# Patient Record
Sex: Female | Born: 1955
Health system: Southern US, Community
[De-identification: ages and names within clinical notes are randomized; demographics above are authoritative.]

## PROBLEM LIST (undated history)

## (undated) DIAGNOSIS — R112 Nausea with vomiting, unspecified: Secondary | ICD-10-CM

## (undated) DIAGNOSIS — K635 Polyp of colon: Secondary | ICD-10-CM

## (undated) DIAGNOSIS — E7800 Pure hypercholesterolemia, unspecified: Secondary | ICD-10-CM

## (undated) DIAGNOSIS — M8580 Other specified disorders of bone density and structure, unspecified site: Secondary | ICD-10-CM

## (undated) DIAGNOSIS — K219 Gastro-esophageal reflux disease without esophagitis: Secondary | ICD-10-CM

## (undated) DIAGNOSIS — R7303 Prediabetes: Secondary | ICD-10-CM

## (undated) DIAGNOSIS — F329 Major depressive disorder, single episode, unspecified: Secondary | ICD-10-CM

## (undated) DIAGNOSIS — I10 Essential (primary) hypertension: Secondary | ICD-10-CM

## (undated) DIAGNOSIS — F32A Depression, unspecified: Secondary | ICD-10-CM

## (undated) DIAGNOSIS — Z9889 Other specified postprocedural states: Secondary | ICD-10-CM

## (undated) DIAGNOSIS — F419 Anxiety disorder, unspecified: Secondary | ICD-10-CM

## (undated) HISTORY — PX: CERVICAL SPINE SURGERY: SHX589

## (undated) HISTORY — DX: Anxiety disorder, unspecified: F41.9

## (undated) HISTORY — DX: Pure hypercholesterolemia, unspecified: E78.00

## (undated) HISTORY — DX: Depression, unspecified: F32.A

## (undated) HISTORY — DX: Gastro-esophageal reflux disease without esophagitis: K21.9

## (undated) HISTORY — DX: Major depressive disorder, single episode, unspecified: F32.9

## (undated) HISTORY — PX: BREAST LUMPECTOMY: SHX2

## (undated) HISTORY — DX: Other specified disorders of bone density and structure, unspecified site: M85.80

## (undated) HISTORY — DX: Polyp of colon: K63.5

---

## 1997-12-02 ENCOUNTER — Other Ambulatory Visit: Admission: RE | Admit: 1997-12-02 | Discharge: 1997-12-02 | Payer: Self-pay | Admitting: Gynecology

## 1999-04-06 ENCOUNTER — Other Ambulatory Visit: Admission: RE | Admit: 1999-04-06 | Discharge: 1999-04-06 | Payer: Self-pay | Admitting: Family Medicine

## 1999-11-11 ENCOUNTER — Other Ambulatory Visit: Admission: RE | Admit: 1999-11-11 | Discharge: 1999-11-11 | Payer: Self-pay | Admitting: Gynecology

## 2000-01-06 ENCOUNTER — Other Ambulatory Visit: Admission: RE | Admit: 2000-01-06 | Discharge: 2000-01-06 | Payer: Self-pay | Admitting: Gynecology

## 2001-09-11 ENCOUNTER — Other Ambulatory Visit: Admission: RE | Admit: 2001-09-11 | Discharge: 2001-09-11 | Payer: Self-pay | Admitting: Gynecology

## 2002-11-11 ENCOUNTER — Other Ambulatory Visit: Admission: RE | Admit: 2002-11-11 | Discharge: 2002-11-11 | Payer: Self-pay | Admitting: Gynecology

## 2003-03-11 ENCOUNTER — Ambulatory Visit (HOSPITAL_BASED_OUTPATIENT_CLINIC_OR_DEPARTMENT_OTHER): Admission: RE | Admit: 2003-03-11 | Discharge: 2003-03-11 | Payer: Self-pay | Admitting: Gynecology

## 2003-03-11 ENCOUNTER — Ambulatory Visit (HOSPITAL_COMMUNITY): Admission: RE | Admit: 2003-03-11 | Discharge: 2003-03-11 | Payer: Self-pay | Admitting: Gynecology

## 2003-03-11 ENCOUNTER — Encounter (INDEPENDENT_AMBULATORY_CARE_PROVIDER_SITE_OTHER): Payer: Self-pay | Admitting: Specialist

## 2004-03-05 ENCOUNTER — Encounter: Admission: RE | Admit: 2004-03-05 | Discharge: 2004-03-05 | Payer: Self-pay | Admitting: Cardiovascular Disease

## 2004-03-12 ENCOUNTER — Encounter: Admission: RE | Admit: 2004-03-12 | Discharge: 2004-03-12 | Payer: Self-pay | Admitting: Cardiovascular Disease

## 2004-08-23 ENCOUNTER — Ambulatory Visit (HOSPITAL_COMMUNITY): Admission: RE | Admit: 2004-08-23 | Discharge: 2004-08-23 | Payer: Self-pay | Admitting: Cardiovascular Disease

## 2005-11-16 ENCOUNTER — Ambulatory Visit: Payer: Self-pay | Admitting: Family Medicine

## 2005-11-25 ENCOUNTER — Ambulatory Visit: Payer: Self-pay | Admitting: Family Medicine

## 2005-11-25 LAB — CONVERTED CEMR LAB
ALT: 21 units/L (ref 0–40)
AST: 17 units/L (ref 0–37)

## 2005-12-05 ENCOUNTER — Ambulatory Visit: Payer: Self-pay | Admitting: Family Medicine

## 2005-12-05 LAB — CONVERTED CEMR LAB: Total CK: 74 units/L (ref 7–177)

## 2005-12-15 ENCOUNTER — Ambulatory Visit: Payer: Self-pay | Admitting: Family Medicine

## 2005-12-20 ENCOUNTER — Encounter: Admission: RE | Admit: 2005-12-20 | Discharge: 2005-12-20 | Payer: Self-pay | Admitting: Family Medicine

## 2006-01-16 ENCOUNTER — Ambulatory Visit: Payer: Self-pay | Admitting: Family Medicine

## 2006-01-16 LAB — CONVERTED CEMR LAB
AST: 20 units/L (ref 0–37)
Cholesterol: 239 mg/dL (ref 0–200)
LDL DIRECT: 188.9 mg/dL

## 2006-02-09 ENCOUNTER — Ambulatory Visit: Payer: Self-pay | Admitting: Cardiology

## 2006-02-16 ENCOUNTER — Ambulatory Visit: Payer: Self-pay | Admitting: Family Medicine

## 2006-03-30 ENCOUNTER — Ambulatory Visit: Payer: Self-pay | Admitting: Cardiology

## 2006-03-30 LAB — CONVERTED CEMR LAB
ALT: 25 units/L (ref 0–40)
Cholesterol: 334 mg/dL (ref 0–200)
Direct LDL: 242.4 mg/dL
Total CHOL/HDL Ratio: 8
Triglycerides: 233 mg/dL (ref 0–149)

## 2006-04-06 ENCOUNTER — Ambulatory Visit: Payer: Self-pay | Admitting: *Deleted

## 2006-05-12 DIAGNOSIS — Z860100 Personal history of colon polyps, unspecified: Secondary | ICD-10-CM | POA: Insufficient documentation

## 2006-05-12 DIAGNOSIS — E785 Hyperlipidemia, unspecified: Secondary | ICD-10-CM | POA: Insufficient documentation

## 2006-05-30 ENCOUNTER — Ambulatory Visit: Payer: Self-pay | Admitting: Cardiology

## 2006-05-30 LAB — CONVERTED CEMR LAB
Alkaline Phosphatase: 53 units/L (ref 39–117)
Bilirubin, Direct: 0.1 mg/dL (ref 0.0–0.3)
Total CHOL/HDL Ratio: 5.9
Total Protein: 6.7 g/dL (ref 6.0–8.3)
Triglycerides: 145 mg/dL (ref 0–149)
VLDL: 29 mg/dL (ref 0–40)

## 2006-06-01 ENCOUNTER — Ambulatory Visit: Payer: Self-pay | Admitting: Cardiology

## 2006-06-02 ENCOUNTER — Ambulatory Visit: Payer: Self-pay | Admitting: Family Medicine

## 2006-06-08 ENCOUNTER — Ambulatory Visit: Payer: Self-pay | Admitting: Cardiovascular Disease

## 2006-06-26 ENCOUNTER — Ambulatory Visit: Payer: Self-pay

## 2006-07-06 ENCOUNTER — Ambulatory Visit: Payer: Self-pay | Admitting: Cardiovascular Disease

## 2006-07-06 ENCOUNTER — Ambulatory Visit: Payer: Self-pay | Admitting: *Deleted

## 2006-07-06 LAB — CONVERTED CEMR LAB
ALT: 23 units/L (ref 0–40)
AST: 19 units/L (ref 0–37)
Bilirubin, Direct: 0.1 mg/dL (ref 0.0–0.3)
Cholesterol: 198 mg/dL (ref 0–200)
HDL: 48 mg/dL (ref >39.0)
Total Protein: 7 g/dL (ref 6.0–8.3)

## 2006-12-01 ENCOUNTER — Ambulatory Visit: Payer: Self-pay | Admitting: Family Medicine

## 2006-12-01 DIAGNOSIS — M8949 Other hypertrophic osteoarthropathy, multiple sites: Secondary | ICD-10-CM | POA: Insufficient documentation

## 2006-12-01 DIAGNOSIS — M159 Polyosteoarthritis, unspecified: Secondary | ICD-10-CM

## 2009-08-26 HISTORY — PX: COLONOSCOPY: SHX174

## 2010-06-25 NOTE — Letter (Signed)
Jun 08, 2006    Leanne Chang, M.D.  [phone] [place], [place] [address]   RE:  ZELLA, DEWAN  MRN:  [id number]  /  DOB:  [birth year]   Dear Dr. Blossom Hoops,   It was my pleasure to see Christine Hawkins as an outpatient at the [facility]  Cardiology Office on Jun 08, 2006.  As you know, she is a delightful, 55-  year-old woman, who presents for further evaluation of chest pain.   Christine Hawkins presents with her husband today.  They describe symptoms of  chest pain that are sharp in nature and occurring in the substernal  region.  They seem to last for only a few seconds, but there is a  prolonged period of fatigue following her episodes of pain.  She also  has some dizziness and headache at times that are associated with her  chest pain.  She states that, this morning, she woke up with left neck,  shoulder and arm pain, but this seems to be exacerbated by movement of  the arm and is unrelated to her chest pain.  She does have some stable  exertional dyspnea, but this is unchanged over time.  She has had no  syncope, orthopnea, PND or palpitations.  Her pain does not radiate to  the back, shoulder or jaw.  She has noticed this pain over the last one  month.  She has had some episodes of chest pain in the past that have  prompted evaluation and she has had an adenosine Myoview stress study  performed back in July of 2006 that was normal.  Her LVEF was calculated  at 65% and there was no evidence of myocardial ischemia.   CURRENT MEDICATIONS INCLUDE:  1. Crestor 10 mg daily.  2. Zetia 10 mg daily.  3. Aspirin 81 mg daily.  4. Calcium daily.  5. An over-the-counter estrogen and soy preparation.   ALLERGIES:  NKDA.   PAST MEDICAL HISTORY:  Pertinent only for dyslipidemia and remote C-  sections.  No other history to report.   FAMILY HISTORY:  Pertinent for coronary artery disease.  Her mother had  a first myocardial infarction at age 55 and died of heart disease at age  77.  Her father had coronary bypass at age 74 and he is still living.  She has a sister who has no history of coronary artery disease and is  overall healthy.   SOCIAL HISTORY:  The patient is married.  She is originally from [place].  She has been living in the [place] for 13 years.  She does  not work outside of the house.  She does not exercise.  She does not  smoke cigarettes or drink alcohol.   REVIEW OF SYSTEMS:  A complete 12-point review of systems was performed.  The only pertinent positive is headaches.  All other systems were  reviewed and are negative, except as detailed above.   PHYSICAL EXAMINATION:  The patient is alert and oriented.  She is in no  acute distress.  She is well-appearing.  Weight is 152 pounds, blood  pressure is 98/70, heart rate is 60, respiratory rate is 12.  HEENT:  Normal.  NECK:  Normal carotid upstrokes without bruits.  Jugular venous pressure  is normal.  No thyromegaly or thyroid nodules.  LUNGS:  Clear to auscultation bilaterally.  HEART:  The apex is discrete and nondisplaced.  There is no right  ventricular heave or lift.  The heart is regular rate and rhythm with an  increased P2 sound.  There are no murmurs or gallops.  ABDOMEN:  Soft, nontender, no organomegaly, no abdominal bruits.  EXTREMITIES:  No clubbing, cyanosis or edema.  Peripheral pulses are 2+  and equal throughout.  NEUROLOGIC:  Cranial nerves II through XII are intact.  Strength is 5/5  and equal in the arms and legs bilaterally.  SKIN:  Warm and dry without rash.  LYMPHATICS:  There is no adenopathy.   Her EKG shows normal sinus rhythm and is within normal limits.   ASSESSMENT:  Christine Hawkins is a 56 year old woman with cardiovascular risk  factors of dyslipidemia and strong family history of coronary artery  disease.  She presents with atypical chest pain.  Despite the atypical  features and normal adenosine Myoview stress study in 2006, I am  concerned about her  chest pain in the setting of her cardiac risk  factors.  I would like her to undergo an exercise echocardiogram to  further evaluate the presence of any inducible ischemia.   She follows up in the lipid clinic for her antihyperlipidemic therapy  and should continue her regular followup there.  She does not have other  modifiable risk factors, such as hypertension or diabetes, and she  should continue on daily aspirin.  We will be in contact with her, as  well as your office, after the results of her stress echocardiogram are  available.   Dr. Blossom Hoops, thanks again for allowing me to see Christine Hawkins.  Please  feel free to call me at any time with questions regarding her care.    Sincerely,      Veverly Fells. Excell Seltzer, MD  Electronically Signed    MDC/MedQ  DD: 06/08/2006  DT: 06/08/2006  Job #: [id number]

## 2010-06-25 NOTE — Op Note (Signed)
Christine Hawkins, Christine Hawkins                              ACCOUNT NO.:  [id number]   MEDICAL RECORD NO.:  [id number]                   PATIENT TYPE:  AMB   LOCATION:  [facility]                                 FACILITY:  [facility]   PHYSICIAN:  Juan H. Lily Peer, M.D.             DATE OF BIRTH:  [birth year]   DATE OF PROCEDURE:  03/11/2003  DATE OF DISCHARGE:                                 OPERATIVE REPORT   PREOPERATIVE DIAGNOSIS:  Postmenopausal bleeding.   POSTOPERATIVE DIAGNOSIS:  Postmenopausal bleeding.   ANESTHESIA:  General endotracheal anesthesia.   PROCEDURES PERFORMED:  1. Diagnostic hysteroscopy.  2. Dilatation and curettage.   SURGEON:  Juan H. Lily Peer, M.D.   INDICATION FOR OPERATION:  A 55 year old gravida 3, para 2, AB 1, with  postmenopausal bleeding as a result of unopposed estrogen.   FINDINGS:  Normal intrauterine cavity.  Both tubal ostia identified.  Smooth  endocervical canal.  No intrauterine cavitary abnormalities noted.   DESCRIPTION OF OPERATION:  After the patient was adequately counseled, she  was taken to the operating room, where she underwent a successful general  endotracheal anesthesia.  She was placed in a low lithotomy position and the  vagina and perineum were prepped and draped in the usual sterile fashion.  A  laminaria that had been placed the night before in the office  intracervically was removed, thus requiring minimal dilatation of the  cervical canal.  A diagnostic hysteroscope was introduced into the  intrauterine cavity after a single-tooth tenaculum had been placed on the  anterior cervical lip for manipulation during the operation.  Sorbitol 3%  was the distending medium, and the intrauterine pressure was set at 100  mmHg.  Initially we started at 80 and increased to 100 for complete  distention of the uterine cavity for full visualization.  The patient had a  smooth endocervical canal, endometrial cavity with no abnormalities noted or  any  abnormal vasculature.  Both tubal ostia were identified.  Once this was  completed, with a serrated curette the intrauterine cavity was curetted to  obtain some tissue sample, although it was minimal, for histologic  evaluation.  The single-tooth tenaculum was removed.  Silver nitrate and  Monsel's solution were used at the site of the tenaculum prong, and the  patient was extubated, transferred to recovery with stable vital signs.  She  did receive 2 g of Cefotan for prophylaxis and received 30 mg of Toradol for  postop analgesia en route to the recovery room.                                               Juan H. Lily Peer, M.D.    JHF/MEDQ  D:  03/11/2003  T:  03/11/2003  Job:  [id number]

## 2010-06-25 NOTE — H&P (Signed)
NAMEARSENIA, GORACKE                              ACCOUNT NO.:  [id number]   MEDICAL RECORD NO.:  [id number]                   PATIENT TYPE:  AMB   LOCATION:  [facility]                                 FACILITY:  [facility]   PHYSICIAN:  Juan H. Lily Peer, M.D.             DATE OF BIRTH:  1955/02/26   DATE OF ADMISSION:  03/11/2003  DATE OF DISCHARGE:                                HISTORY & PHYSICAL   CHIEF COMPLAINT:  Postmenopausal bleeding.   HISTORY OF PRESENT ILLNESS:  The patient is a 55 year old, gravida 3, para  2, AB 1, who was seen in the office in October of 2004.  She had stated that  she had been taking an over the counter estrogen from the health food  department that was called Estrovent.  She had been concerned that she was  taking unopposed estrogen and she had continued to do so.  She had reported  some vaginal bleeding back then.  We intended to do an endometrial biopsy,  but her cervix was stenotic, requiring dilators, but was minimally able to  fit it through without causing severe discomfort for the patient.  It was  felt that it was better to proceed with endometrial biopsy and diagnostic  hysteroscopy at an outpatient surgical center under anesthesia.  We did  manage to get an ultrasound on January 13, 2003, which demonstrated an  endometrial stripe of 0.9 mm.  The right and left ovaries were normal.  The  cul-de-sac was negative.  She has suffered from vasomotor symptoms.  In the  past she had been on Prempro and discontinued it.  We are contemplating on  starting her on low-dose cyclical estrogen replacement therapy to help with  her symptoms once we know for sure that we have thoroughly evaluated her  postmenopausal bleeding/unopposed estrogen.   PAST MEDICAL HISTORY:  She is menopausal.  She has had two cesarean  sections.  She has had one D&C.  She has hypercholesterolemia for which she  is currently on Lipitor.  She is taking calcium supplementation.   FAMILY  HISTORY:  Significant for the fact that both parents had a history of  cardiovascular disease.   MEDICATIONS:  No other medications.   PHYSICAL EXAMINATION:  WEIGHT:  The patient currently weighs 146-1/2 pounds.  HEIGHT:  5 feet 3-1/2 inches tall.  VITAL SIGNS:  Blood pressure 120/68.  HEENT:  Unremarkable.  NECK:  Supple.  Trachea midline.  No carotid bruits.  No thyromegaly.  LUNGS:  Clear to auscultation without rhonchi or wheezes.  HEART:  Regular rate and rhythm.  No murmurs or gallops.  BREASTS:  Unremarkable at the time of her annual exam.  ABDOMEN:  Soft and nontender without rebound or guarding.  PELVIC:  Bartholin's, urethral, and Skene's glands within normal limits.  Vagina with atrophic changes.  Uterus slightly anteverted and normal in  size, shape, and consistency.  Adnexa without masses or tenderness.  RECTAL:  Hemoccult negative.   ASSESSMENT:  A 55 year old, gravida 3, para 2, AB 1, with unopposed  estrogen, postmenopausal bleeding, and stenotic cervical os.  Was seen in  the office today on March 10, 2003.  Xylocaine 1% had been infiltrated  into the cervical stroma at the 2, 4, 8, and 10 o'clock positions for  approximately 8 mL and with small dilators the cervix was able to be dilated  in an effort to introduce a laminaria to be placed intracervically to  facilitate tomorrow's hysteroscopic procedure.  Also today the patient was  counseled as to the risks, benefits, pros, and cons of hysteroscopic surgery  and literature information had previously been provided.  The plan is to  proceed with a diagnostic hysteroscopy and an endometrial biopsy in an  outpatient setting.  She was also given a prescription for Lortab 7.5/500 mg  to take one p.o. q.4-6h. p.r.n. in the event that she may need it tonight.  All of the above we discussed with the patient in Spanish and will follow  accordingly.   PLAN:  The patient is scheduled for diagnostic hysteroscopy and  endometrial  biopsy on March 11, 2003, at 1 p.m. at the [facility].                                               Juan H. Lily Peer, M.D.    JHF/MEDQ  D:  03/10/2003  T:  03/10/2003  Job:  [id number]

## 2010-06-25 NOTE — Assessment & Plan Note (Signed)
[facility]                               LIPID CLINIC NOTE   Christine Hawkins, Christine Hawkins                           MRN:          [id number]  DATE:02/09/2006                            DOB:          Sep 06, 1955    Christine Hawkins comes in today for the first visit with [place] here in the Lipid  Clinic, today accompanied by her husband and grandchild.  Christine Hawkins is  primarily a Spanish speaker, so most of the communication was done via  translation with her husband.  She was referred to [place] by Dr. Blossom Hoops  at the [facility].   PAST MEDICAL HISTORY:  Per the patient and husband.  Osteoarthritis with  spinal calcifications and some bone spurs.  High cholesterol.   CURRENT MEDICATIONS:  Include:  1. Lovastatin 40 mg daily which she started 2 months ago.  2. Estrogen which is an over-the-counter product of some sort.  3. Calcium supplementations.  4. PRN Tylenol.   She has tried other cholesterol medications in the past, Pravachol for  many years, Lipitor for a short period of time and Vytorin for an  undetermined amount of time.  She is currently experiencing shoulder,  back and arm pains, which she attributes to lovastatin.  She has had  similar aches and pains with other statins she has been on in the past.  However, she has been compliant with her lovastatin.   FAMILY HISTORY:  Includes father who had an MI and a mother who had an  MI and a stroke.   PHYSICAL EXAMINATION:  Her weight is 154 pounds.  Blood pressure is  100/60, heart rate is 100.   LABORATORY DATA:  Includes total cholesterol 239, triglycerides 74, HDL  48.4, LDL 188.9, liver function tests are within normal limits.   ASSESSMENT:  Christine Hawkins seems to be experiencing some myalgias on  lovastatin.  Her cholesterol is at goal except for an elevated LDL.  Her  goal LDL for primary prevention is less than 100.   PLAN:  We are going to draw some blood today to check a creatine kinase  to further determine if the myalgias are due to the statin that she is  taking, but in the meantime we are going to switch to Crestor, which is  more water soluble and carries less incidence of myalgias.  We are going  to start with 2.5 mg daily for 1 week and then 5 mg daily thereafter.  Samples are given.  Her and her husband voiced understanding of the  plan.  We did not talk much about diet and exercise at this visit, but  will make an effort to do this more next time.  Follow up is in 6 weeks,  at which time we will get a lipid and liver panel.  Her and her husband  were instructed to call with any questions or concerns.   follow-up:  ck wnl.Marland Kitchenkeep plan as above...mp      Charolotte Eke, PharmD  Electronically Signed      Salvadore Farber, MD  Electronically Signed   TP/MedQ  DD: 02/09/2006  DT: 02/09/2006  Job #: [id number]   cc:   Leanne Chang, M.D.

## 2010-06-25 NOTE — Assessment & Plan Note (Signed)
[facility]                               LIPID CLINIC NOTE   Christine Hawkins, Christine Hawkins                           MRN:          [id number]  DATE:06/01/2006                            DOB:          Mar 02, 1955    Christine Hawkins comes in today for followup of her hyperlipidemia therapy which  includes Crestor 5 mg daily.  She has been compliant with this and  tolerating it just fine   PHYSICAL EXAMINATION:  Includes a weight of 150 pounds.  Her blood  pressure is 115/70, heart rate is 60.   LABORATORY DATA:  Includes total cholesterol 259, triglycerides 145, HDL  43.6, LDL 183.2.  Liver function tests are within normal limits.   She had been continuing to change to a heart healthy diet.  She drinks  soy milk on a regular basis.  She also uses dietary supplement drink  called Melina Copa, which she reports has algae in it.  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  Of note,  she complains over the last 2 weeks of having some episodic dizziness  and some chest pain.  She has increased her work load over these 2 weeks  and the chest pain and dizziness does occur when she is worn down at the  end of a long day.  She takes her blood pressure at home and claims it  has been more variable lately, being higher and lower than normal.   ASSESSMENT:  Christine Hawkins seems to be tolerating Crestor.  Her triglycerides  are up significantly from 74 to 145, although they remain at the goal of  less than 150.  HDL remains at goal of greater than 40.  Her LDL remains  quite high and not at the goal of less than 100.  We are not seeing the  response from starting Crestor as we would anticipate.  Her and her  husband claim that she has been compliant with it.  The episodic  dizziness and chest pain is a little bit worrisome.  I asked her to  followup with her primary doctor which is  Dr. Blossom Hoops and I will send  a copy of  this dictation to his office.   PLAN:  We are going to increase the Crestor from 5 mg a day to 10 mg a  day.  We are going to add Zetia 10 mg a day also.  I asked her to start  taking a aspirin 81 mg daily and also to carry those with her and take 2  right away if she experiences chest pain like she has been describing.  I also asked her to followup with Dr. Blossom Hoops. I encouraged her to  continue with her heart healthy diet and if she does not experience this  dizziness and chest pain to try to make sure she  is getting regular exercise with walking.  I am going to followup with  her in 4 weeks with  lipid and liver panels to assess the medication  changes we made.      Charolotte Eke, PharmD  Electronically Signed      Rollene Rotunda, MD, [facility]  Electronically Signed   TP/MedQ  DD: 06/01/2006  DT: 06/01/2006  Job #: [id number]   cc:   Leanne Chang, M.D.

## 2010-06-25 NOTE — Assessment & Plan Note (Signed)
[facility]                        [place] OFFICE NOTE   Christine Hawkins, Christine Hawkins                           MRN:          [id number]  DATE:06/02/2006                            DOB:          1955-11-13    REASON FOR VISIT:  Chest pain.  Ms. Spilman is a 55 year old female who is  being seen at the lipid clinic, and complained of intermittent chest  pain off and on for the last 2 weeks.  We were advised of that and we  recommended the patient be evaluated in our office.  On further  questioning, the patient reports that the pain is very brief and lasts  less than 5 seconds.  Nonetheless, it is associated with dizziness and  fatigue.  The patient reports it is not associated with physical  activity.  She has a hard time describing the discomfort, sharp versus  pressure like.  The patient does reveal that she is under a lot of  stress and does have a history of anxiety and depression.  She was  previously referred to a therapist, but did not followup.  She stated  that she didn't want to take any medications for depression or  anxiety.   The patient also revealed that she has had a stress test done 5 years  ago somewhere in [place], and it was unremarkable.  Based on our  discussion, it appeared that it was for similar symptoms.   PAST MEDICAL HISTORY:  Hyperlipidemia.   MEDICATION:  1. Crestor 5 mg.  2. Calcium supplement.  3. Tylenol p.r.n.   ALLERGIES:  No known drug allergies.   REVIEW OF SYSTEMS:  As per HPI, otherwise, patient denied any  palpitations, dyspnea on exertion, cough, wheezing.  She also denied any  GI symptoms.   OBJECTIVE:  Weight 152, temperature 98.1, pulse 64, blood pressure  104/70.  IN GENERAL:  We have a pleasant female with mildly depressed mood.  HEENT:  Unremarkable.  NECK:  Supple, no lymphadenopathy, quiet bruits or JVD.  LUNGS:  Clear.  HEART:  Regular rate and rhythm, normal S1, S2, no murmurs, gallops,  or  rubs.  EXTREMITIES:  No cyanosis, clubbing, or edema.   EKG sinus brady, no ST elevations or depressions.  No PVCs or PACs.   Mood appeared down, speech had regular rate and rhythm.  Affect slightly  flat.   IMPRESSION:  A 55 year old female presenting with 2-week history of  intermittent atypical chest pain associated with brief episode of  dizziness and fatigue.  It is very likely that her symptoms are due to  anxiety, but given history of hyperlipidemia and her age, a cardiac  evaluation is warranted.   PLAN:  1. Supportive counseling was provided.  I did advise patient that I am      extremely concerned about her mental state regarding her depression      and anxiety.  She is not suicidal, but at this point declines      medication therapy.  She agreed to call [facility] for      psychotherapy as previously  referred.  2. Will refer patient to cardiology to rule out a cardiac etiology.  3. The patient to follow up with me in 1 month or sooner if need be.      Precautions were reviewed.     Leanne Chang, M.D.  Electronically Signed    LA/MedQ  DD: 06/02/2006  DT: 06/02/2006  Job #: [id number]

## 2011-04-28 DIAGNOSIS — D500 Iron deficiency anemia secondary to blood loss (chronic): Secondary | ICD-10-CM | POA: Insufficient documentation

## 2011-04-28 DIAGNOSIS — IMO0002 Reserved for concepts with insufficient information to code with codable children: Secondary | ICD-10-CM | POA: Insufficient documentation

## 2011-04-29 DIAGNOSIS — E876 Hypokalemia: Secondary | ICD-10-CM | POA: Insufficient documentation

## 2011-05-22 DIAGNOSIS — Z981 Arthrodesis status: Secondary | ICD-10-CM | POA: Insufficient documentation

## 2012-06-07 ENCOUNTER — Ambulatory Visit (HOSPITAL_COMMUNITY)
Admission: RE | Admit: 2012-06-07 | Discharge: 2012-06-07 | Disposition: A | Discharge status: Home / Self Care | Payer: BC Managed Care – PPO | Source: Ambulatory Visit | Attending: Cardiology | Admitting: Cardiology

## 2012-06-07 ENCOUNTER — Encounter (HOSPITAL_COMMUNITY)
Admission: RE | Disposition: A | Discharge status: Home / Self Care | Payer: Self-pay | Source: Ambulatory Visit | Attending: Cardiology

## 2012-06-07 DIAGNOSIS — I10 Essential (primary) hypertension: Secondary | ICD-10-CM | POA: Insufficient documentation

## 2012-06-07 DIAGNOSIS — R079 Chest pain, unspecified: Secondary | ICD-10-CM | POA: Insufficient documentation

## 2012-06-07 DIAGNOSIS — Z79899 Other long term (current) drug therapy: Secondary | ICD-10-CM | POA: Insufficient documentation

## 2012-06-07 DIAGNOSIS — F411 Generalized anxiety disorder: Secondary | ICD-10-CM | POA: Insufficient documentation

## 2012-06-07 DIAGNOSIS — E785 Hyperlipidemia, unspecified: Secondary | ICD-10-CM | POA: Insufficient documentation

## 2012-06-07 DIAGNOSIS — Z8249 Family history of ischemic heart disease and other diseases of the circulatory system: Secondary | ICD-10-CM | POA: Insufficient documentation

## 2012-06-07 DIAGNOSIS — Z7982 Long term (current) use of aspirin: Secondary | ICD-10-CM | POA: Insufficient documentation

## 2012-06-07 HISTORY — PX: LEFT HEART CATHETERIZATION WITH CORONARY ANGIOGRAM: SHX5451

## 2012-06-07 LAB — CBC
MCH: 26.5 pg (ref 26.0–34.0)
MCV: 82.8 fL (ref 78.0–100.0)
Platelets: 187 10*3/uL (ref 150–400)
RBC: 4.31 MIL/uL (ref 3.87–5.11)
RDW: 14.1 % (ref 11.5–15.5)
WBC: 7.1 10*3/uL (ref 4.0–10.5)

## 2012-06-07 LAB — LIPID PANEL
HDL: 52 mg/dL (ref >39)
LDL Cholesterol: 116 mg/dL — ABNORMAL HIGH (ref 0–99)
Triglycerides: 114 mg/dL (ref <150)
VLDL: 23 mg/dL (ref 0–40)

## 2012-06-07 LAB — COMPREHENSIVE METABOLIC PANEL
AST: 18 U/L (ref 0–37)
Albumin: 3.7 g/dL (ref 3.5–5.2)
Calcium: 9.4 mg/dL (ref 8.4–10.5)
Creatinine, Ser: 0.79 mg/dL (ref 0.50–1.10)
Total Protein: 6.7 g/dL (ref 6.0–8.3)

## 2012-06-07 LAB — PROTIME-INR: Prothrombin Time: 13.3 seconds (ref 11.6–15.2)

## 2012-06-07 SURGERY — LEFT HEART CATHETERIZATION WITH CORONARY ANGIOGRAM
Anesthesia: LOCAL

## 2012-06-07 MED ORDER — SODIUM CHLORIDE 0.9 % IV SOLN: 250.0000 mL | INTRAVENOUS | Status: DC | PRN

## 2012-06-07 MED ORDER — HEPARIN (PORCINE) IN NACL 2-0.9 UNIT/ML-% IJ SOLN
INTRAMUSCULAR | Status: AC
  Filled 2012-06-07: qty 1500

## 2012-06-07 MED ORDER — SODIUM CHLORIDE 0.9 % IV SOLN: INTRAVENOUS | Status: DC

## 2012-06-07 MED ORDER — VERAPAMIL HCL 2.5 MG/ML IV SOLN
INTRAVENOUS | Status: AC
  Filled 2012-06-07: qty 2

## 2012-06-07 MED ORDER — SODIUM CHLORIDE 0.9 % IJ SOLN: 3.0000 mL | INTRAMUSCULAR | Status: DC | PRN

## 2012-06-07 MED ORDER — NITROGLYCERIN 1 MG/10 ML FOR IR/CATH LAB
INTRA_ARTERIAL | Status: AC
  Filled 2012-06-07: qty 10

## 2012-06-07 MED ORDER — ACETAMINOPHEN 325 MG PO TABS: 650.0000 mg | ORAL_TABLET | ORAL | Status: DC | PRN

## 2012-06-07 MED ORDER — SODIUM CHLORIDE 0.9 % IV SOLN
INTRAVENOUS | Status: DC
  Administered 2012-06-07: 1000 mL via INTRAVENOUS

## 2012-06-07 MED ORDER — ASPIRIN 81 MG PO CHEW
324.0000 mg | CHEWABLE_TABLET | ORAL | Status: AC
  Administered 2012-06-07: 324 mg via ORAL

## 2012-06-07 MED ORDER — HYDROMORPHONE HCL PF 2 MG/ML IJ SOLN
INTRAMUSCULAR | Status: AC
  Filled 2012-06-07: qty 1

## 2012-06-07 MED ORDER — ONDANSETRON HCL 4 MG/2ML IJ SOLN: 4.0000 mg | Freq: Four times a day (QID) | INTRAMUSCULAR | Status: DC | PRN

## 2012-06-07 MED ORDER — SODIUM CHLORIDE 0.9 % IJ SOLN: 3.0000 mL | Freq: Two times a day (BID) | INTRAMUSCULAR | Status: DC

## 2012-06-07 MED ORDER — LIDOCAINE HCL (PF) 1 % IJ SOLN
INTRAMUSCULAR | Status: AC
  Filled 2012-06-07: qty 30

## 2012-06-07 MED ORDER — ASPIRIN 81 MG PO CHEW
CHEWABLE_TABLET | ORAL | Status: AC
  Filled 2012-06-07: qty 4

## 2012-06-07 MED ORDER — MIDAZOLAM HCL 2 MG/2ML IJ SOLN
INTRAMUSCULAR | Status: AC
  Filled 2012-06-07: qty 2

## 2012-06-07 NOTE — Interval H&P Note (Signed)
History and Physical Interval Note:  06/07/2012 7:38 AM  Christine Hawkins  has presented today for surgery, with the diagnosis of c/p  The various methods of treatment have been discussed with the patient and family. After consideration of risks, benefits and other options for treatment, the patient has consented to  Procedure(s): LEFT HEART CATHETERIZATION WITH CORONARY ANGIOGRAM (N/A) and possible angioplasty as a surgical intervention .  The patient's history has been reviewed, patient examined, no change in status, stable for surgery.  I have reviewed the patient's chart and labs.  Questions were answered to the patient's satisfaction.     Pamella Pert

## 2012-06-07 NOTE — H&P (Signed)
  Please see office visit notes for complete details of HPI.  

## 2012-06-07 NOTE — Progress Notes (Signed)
Entire initial assessment done with son Luellen Pucker present and interpreting for patient.

## 2012-06-07 NOTE — CV Procedure (Addendum)
Procedure performed:  Left heart catheterization including hemodynamic monitoring of the left ventricle, LV gram, selective right and left coronary arteriography.  Indication patient is a 57 year-old female with history of hypertension,  hyperlipidemia, family history of premature CAD presenting with recurrent chest pain.  With the suspicion for [place], and CAD, patient is brought to the cardiac catheterization lab to evaluate the  coronary anatomy for definitive diagnosis of CAD.  Hemodynamic data:  Left ventricular pressure was 106/0 with LVEDP of 13 mm mercury. Aortic pressure was 105/51 with a mean of 75 mm mercury. There was no pressure gradient across the aortic valve  Left ventricle: Performed in the RAO projection revealed LVEF of 60%. There was No MR. No wall motion abnormality.  Right coronary artery: The vessel is smooth, normal, Co-Dominant. The ostium of the right coronary artery showed catheter-induced spasm. Immediately after engaging the right coronary artery, there is mild damping, however there was contrast flow evident around the catheter. There was significant torque build-up and difficulty in engaging the right coronary artery.  Left main coronary artery is large and normal.  Circumflex coronary artery: A large vessel giving origin to a large obtuse marginal 1. It is smooth and normal. It is codominant.  LAD:  LAD gives origin to a large diagonal-1. Is normal.  Recommendation: Normal coronary arteries, codominant circulation, catheter induced spasm of the ostium of the right coronary artery. I did not attempt to see engage the RCA due to the fact she had significant tension buildup across the catheter and port was difficult. Hence did not want to injure the ostium of the RCA. If she continues to have recurrent chest pain, would consider repeat coronary angiography with probable IVUS. Patient is under extreme distress due to  social issues at home, will continue to monitor her  closely.  Technique: Under sterile precautions using a 6 French right radial  arterial access, a 5 French sheath was introduced into the right radial artery. A 5 [place] Tig 4 catheter was advanced into the ascending aorta selective left coronary artery was cannulated and angiography was performed in multiple views. The same catheter was used to perform left ventriculogram in the RAO projection. Then a 5 [place] Judkins right 4 diagnostic catheter was utilized to engage the right coronary artery with moderate amount of difficulty. The catheter was pulled back Out of the body over exchange length J-wire. NO immediate complications noted. Patient tolerated the procedure well. Hemostasis was obtained by applying TR band.

## 2014-01-16 ENCOUNTER — Encounter (HOSPITAL_COMMUNITY): Payer: Self-pay | Admitting: Cardiology

## 2014-03-19 ENCOUNTER — Telehealth: Payer: Self-pay

## 2015-01-30 DIAGNOSIS — R600 Localized edema: Secondary | ICD-10-CM | POA: Insufficient documentation

## 2015-01-30 DIAGNOSIS — G5601 Carpal tunnel syndrome, right upper limb: Secondary | ICD-10-CM | POA: Insufficient documentation

## 2015-01-30 DIAGNOSIS — R7309 Other abnormal glucose: Secondary | ICD-10-CM | POA: Insufficient documentation

## 2015-01-30 DIAGNOSIS — R7401 Elevation of levels of liver transaminase levels: Secondary | ICD-10-CM | POA: Insufficient documentation

## 2015-04-02 DIAGNOSIS — E785 Hyperlipidemia, unspecified: Secondary | ICD-10-CM | POA: Insufficient documentation

## 2015-04-02 DIAGNOSIS — R7303 Prediabetes: Secondary | ICD-10-CM | POA: Insufficient documentation

## 2015-04-02 DIAGNOSIS — K219 Gastro-esophageal reflux disease without esophagitis: Secondary | ICD-10-CM | POA: Insufficient documentation

## 2015-04-02 DIAGNOSIS — F3342 Major depressive disorder, recurrent, in full remission: Secondary | ICD-10-CM | POA: Insufficient documentation

## 2015-04-02 DIAGNOSIS — L2084 Intrinsic (allergic) eczema: Secondary | ICD-10-CM | POA: Insufficient documentation

## 2015-09-14 DIAGNOSIS — G8929 Other chronic pain: Secondary | ICD-10-CM | POA: Insufficient documentation

## 2015-09-14 DIAGNOSIS — M545 Low back pain, unspecified: Secondary | ICD-10-CM | POA: Insufficient documentation

## 2015-12-17 DIAGNOSIS — Z23 Encounter for immunization: Secondary | ICD-10-CM | POA: Insufficient documentation

## 2016-03-15 DIAGNOSIS — J101 Influenza due to other identified influenza virus with other respiratory manifestations: Secondary | ICD-10-CM | POA: Insufficient documentation

## 2016-04-19 DIAGNOSIS — F99 Mental disorder, not otherwise specified: Secondary | ICD-10-CM | POA: Insufficient documentation

## 2016-04-19 DIAGNOSIS — F5105 Insomnia due to other mental disorder: Secondary | ICD-10-CM | POA: Insufficient documentation

## 2017-05-11 DIAGNOSIS — R0602 Shortness of breath: Secondary | ICD-10-CM | POA: Insufficient documentation

## 2017-05-11 DIAGNOSIS — I6523 Occlusion and stenosis of bilateral carotid arteries: Secondary | ICD-10-CM | POA: Insufficient documentation

## 2017-05-11 DIAGNOSIS — K5900 Constipation, unspecified: Secondary | ICD-10-CM | POA: Insufficient documentation

## 2017-11-07 ENCOUNTER — Ambulatory Visit (INDEPENDENT_AMBULATORY_CARE_PROVIDER_SITE_OTHER): Payer: Medicare Other | Admitting: Emergency Medicine

## 2017-11-07 ENCOUNTER — Ambulatory Visit (INDEPENDENT_AMBULATORY_CARE_PROVIDER_SITE_OTHER): Payer: Medicare Other

## 2017-11-07 ENCOUNTER — Encounter: Payer: Self-pay | Admitting: Emergency Medicine

## 2017-11-07 VITALS — BP 144/75 | HR 64 | Temp 98.1°F | Resp 17 | Ht 63.0 in | Wt 171.0 lb

## 2017-11-07 DIAGNOSIS — G8929 Other chronic pain: Secondary | ICD-10-CM

## 2017-11-07 DIAGNOSIS — M542 Cervicalgia: Secondary | ICD-10-CM

## 2017-11-07 DIAGNOSIS — M159 Polyosteoarthritis, unspecified: Secondary | ICD-10-CM

## 2017-11-07 DIAGNOSIS — E785 Hyperlipidemia, unspecified: Secondary | ICD-10-CM

## 2017-11-07 DIAGNOSIS — Z8639 Personal history of other endocrine, nutritional and metabolic disease: Secondary | ICD-10-CM

## 2017-11-07 DIAGNOSIS — Z1159 Encounter for screening for other viral diseases: Secondary | ICD-10-CM

## 2017-11-07 DIAGNOSIS — Z1239 Encounter for other screening for malignant neoplasm of breast: Secondary | ICD-10-CM

## 2017-11-07 DIAGNOSIS — R399 Unspecified symptoms and signs involving the genitourinary system: Secondary | ICD-10-CM

## 2017-11-07 NOTE — Progress Notes (Addendum)
Saron Grubb 62 y.o.   Chief Complaint  Patient presents with  . Establish Care    check cholestrol, hand pain     HISTORY OF PRESENT ILLNESS: This is a 62 y.o. female here to establish care.  First visit with me. Has a history of osteoarthritis of her hands with chronic intermittent pain. Also has a history of cervical vertebrae fracture secondary to an accident several years ago.  Status post surgery.  Chronic neck pain. Has a history of high cholesterol.  Used to be on Crestor and Zetia but not taking it at present time.  Has not been tested in a while. No other significant past medical history. Review of systems reveal chronic lower urinary symptoms.  Has a history of 2 C-sections in the past. No recent mammogram.  HPI   Prior to Admission medications   Medication Sig Start Date End Date Taking? Authorizing Provider  aspirin EC 81 MG tablet Take 81 mg by mouth daily.   Yes [provider]  Boswellia-Glucosamine-Vit D (GLUCOSAMINE COMPLEX PO) Take 1 tablet by mouth daily.   Yes [provider]  Calcium Carbonate-Vitamin D (CALCIUM + D PO) Take 1 tablet by mouth 2 (two) times daily.   Yes [provider]  ezetimibe (ZETIA) 10 MG tablet Take 10 mg by mouth daily.   Yes [provider]  mirtazapine (REMERON) 15 MG tablet Take 15 mg by mouth at bedtime.   Yes [provider]  PARoxetine (PAXIL) 20 MG tablet Take 40 mg by mouth daily.    Yes [provider]  rosuvastatin (CRESTOR) 10 MG tablet Take 40 mg by mouth daily.   Yes [provider]  ALPRAZolam (XANAX) 0.25 MG tablet Take 0.25 mg by mouth 3 (three) times daily as needed for sleep or anxiety.    [provider]  Flaxseed, Linseed, (FLAX SEED OIL PO) Take 1 capsule by mouth 3 (three) times daily.    [provider]  metoprolol tartrate (LOPRESSOR) 25 MG tablet Take 25 mg by mouth 2 (two) times daily.    [provider]  nitroGLYCERIN  (NITROSTAT) 0.4 MG SL tablet Place 0.4 mg under the tongue every 5 (five) minutes as needed for chest pain.    [provider]    Allergies  Allergen Reactions  . Lactose Intolerance (Gi)   . Niaspan [Niacin] Rash    Itching    Patient Active Problem List   Diagnosis Date Noted  . OSTEOARTHRITIS 12/01/2006  . HYPERLIPIDEMIA 05/12/2006  . COLONIC POLYPS, BENIGN, HX OF 05/12/2006    No past medical history on file.    Social History   Socioeconomic History  . Marital status: Married    Spouse name: Not on file  . Number of children: Not on file  . Years of education: Not on file  . Highest education level: Not on file  Occupational History  . Not on file  Social Needs  . Financial resource strain: Not on file  . Food insecurity:    Worry: Not on file    Inability: Not on file  . Transportation needs:    Medical: Not on file    Non-medical: Not on file  Tobacco Use  . Smoking status: Never Smoker  . Smokeless tobacco: Never Used  Substance and Sexual Activity  . Alcohol use: Not on file  . Drug use: Not on file  . Sexual activity: Not on file  Lifestyle  . Physical activity:    Days  per week: Not on file    Minutes per session: Not on file  . Stress: Not on file  Relationships  . Social connections:    Talks on phone: Not on file    Gets together: Not on file    Attends religious service: Not on file    Active member of club or organization: Not on file    Attends meetings of clubs or organizations: Not on file    Relationship status: Not on file  . Intimate partner violence:    Fear of current or ex partner: Not on file    Emotionally abused: Not on file    Physically abused: Not on file    Forced sexual activity: Not on file  Other Topics Concern  . Not on file  Social History Narrative  . Not on file    No family history on file.   Review of Systems  Constitutional: Negative.  Negative for chills and fever.  HENT: Negative for sore  throat.   Eyes: Negative.  Negative for blurred vision and double vision.  Respiratory: Negative.  Negative for cough and shortness of breath.   Cardiovascular: Negative.  Negative for chest pain and palpitations.  Gastrointestinal: Negative.  Negative for abdominal pain, blood in stool, nausea and vomiting.  Genitourinary: Positive for frequency and urgency.  Musculoskeletal: Positive for joint pain and neck pain.  Skin: Negative.  Negative for rash.  Neurological: Negative.  Negative for dizziness and headaches.  Endo/Heme/Allergies: Negative.     Vitals:   11/07/17 1413  BP: (!) 144/75  Pulse: 64  Resp: 17  Temp: 98.1 F (36.7 C)  SpO2: 98%    Physical Exam  Constitutional: She is oriented to person, place, and time. She appears well-developed and well-nourished.  HENT:  Head: Normocephalic and atraumatic.  Right Ear: External ear normal.  Left Ear: External ear normal.  Nose: Nose normal.  Mouth/Throat: Oropharynx is clear and moist.  Eyes: Pupils are equal, round, and reactive to light. Conjunctivae and EOM are normal.  Neck: Muscular tenderness present. No spinous process tenderness present. Decreased range of motion present. No erythema present.  Cardiovascular: Normal rate, regular rhythm and normal heart sounds.  Pulmonary/Chest: Effort normal and breath sounds normal.  Abdominal: Soft. Bowel sounds are normal. She exhibits no distension. There is no tenderness.  Musculoskeletal: She exhibits no edema or tenderness.  Positive osteoarthritic changes to her fingers both hands.  Lymphadenopathy:    She has no cervical adenopathy.  Neurological: She is alert and oriented to person, place, and time. No sensory deficit. She exhibits normal muscle tone.  Skin: Skin is warm and dry. Capillary refill takes less than 2 seconds.  Psychiatric: She has a normal mood and affect. Her behavior is normal.  Vitals reviewed.  Dg Cervical Spine 2 Or 3 Views  Result Date:  11/07/2017 CLINICAL DATA:  Neck pain. Prior cervical spine fusion. Scratched it prior cervical spine surgery. EXAM: CERVICAL SPINE - 2-3 VIEW COMPARISON:  No prior FINDINGS: C4 through C7 anterior and interbody fusion. Hardware intact. No acute bony abnormality identified. No evidence of fracture. Biapical pleural thickening noted consistent scarring. IMPRESSION: C4 through C7 anterior interbody fusion. Hardware intact. No acute bony abnormality identified. Electronically Signed   By: Marcello Moores  Register   On: 11/07/2017 15:08     ASSESSMENT & PLAN: Valla Leaver was seen today for establish care.  Diagnoses and all orders for this visit:  Osteoarthritis involving multiple joints on both sides of body  Chronic neck pain -     DG Cervical Spine 2 or 3 views; Future -     Ambulatory referral to Orthopedic Surgery  History of high cholesterol -     CBC with Differential/Platelet; Future -     Comprehensive metabolic panel; Future -     Hemoglobin A1c; Future -     Lipid panel; Future  Lower urinary tract symptoms (LUTS) -     Ambulatory referral to Gynecology -     CBC with Differential/Platelet; Future  Need for hepatitis C screening test -     Hepatitis C antibody; Future  Breast cancer screening -     MM Digital Screening; Future    Patient Instructions       If you have lab work done today you will be contacted with your lab results within the next 2 weeks.  If you have not heard from [place] then please contact us. The fastest way to get your results is to register for My Chart.   IF you received an x-ray today, you will receive an invoice from [facility]. Please contact [facility] at [phone] with questions or concerns regarding your invoice.   IF you received labwork today, you will receive an invoice from [place]. Please contact [facility] at [phone] with questions or concerns regarding your invoice.   Our billing staff will not be able to assist you  with questions regarding bills from these companies.  You will be contacted with the lab results as soon as they are available. The fastest way to get your results is to activate your My Chart account. Instructions are located on the last page of this paperwork. If you have not heard from [place] regarding the results in 2 weeks, please contact this office.      Artrosis Osteoarthritis La artrosis es un tipo de reumatismo articular que afecta el tejido que cubre los extremos de los huesos en las articulaciones (cartlago). El cartlago acta como amortiguador Monsanto Company y los ayuda a moverse con suavidad. La artrosis se produce cuando el cartlago de las articulaciones se gasta. A veces, la artrosis se denomina reumatismo articular "por uso y desgaste". La artrosis es la forma ms frecuente de reumatismo articular. A menudo, afecta a las The First American. Es una enfermedad que empeora con el tiempo (una enfermedad progresiva). Esta enfermedad afecta con ms frecuencia las articulaciones de:  Los dedos de Marriott.  Los dedos Kellogg.  Las caderas.  Las rodillas.  La columna vertebral, incluido el cuello y la zona lumbar.  Cules son las causas? Esta enfermedad es causada por el desgaste del cartlago que cubre los extremos de [place], lo cual tiene relacin con la edad. Qu incrementa el riesgo? Los siguientes factores pueden hacer que usted sea ms propenso a tener esta enfermedad:  Edad avanzada.  Tener exceso de [place] u obesidad.  Uso excesivo de las articulaciones, como en el caso de los [place].  Lesin pasada de Insurance claims handler.  Ciruga pasada en una articulacin.  Antecedentes familiares de artrosis.  Cules son los signos o los sntomas? Los principales sntomas de esta enfermedad son dolor, hinchazn y Advertising account executive. Con el tiempo, la articulacin puede perder su forma. Se pueden desprender pequeos trozos de Praxair o cartlago y flotar dentro  de la articulacin, lo cual puede causar ms dolor y Agricultural consultant. Pueden formarse pequeos depsitos de hueso (ostefitos) en los extremos de Water engineer. Otros sntomas pueden  incluir lo siguiente:  Una sensacin de chirrido o raspado dentro de la articulacin al moverla.  Sonidos de chasquido o crujido al [employer].  Los sntomas pueden afectar una o ms articulaciones. La artrosis en una articulacin principal, como la rodilla o la cadera, puede causar dolor al caminar o al realizar ejercicio. Si tiene artrosis IAC/InterActiveCorp, es posible que no pueda agarrar objetos, torcer la mano o controlar pequeos movimientos de las manos y los dedos (motricidad fina). Cmo se diagnostica? Esta enfermedad se puede diagnosticar en funcin de lo siguiente:  Sus antecedentes mdicos.  Un examen fsico.  Sus sntomas.  Radiografas de la(s) articulacin(es) afectada(s).  Anlisis de sangre para descartar otros tipos de reumatismo articular.  Cmo se trata? No hay cura para esta enfermedad, pero el tratamiento puede ayudar a Financial controller y Teacher, English as a foreign language el funcionamiento de Water engineer. Los planes de tratamiento pueden incluir lo siguiente:  Un programa de ejercicios indicado que permita el descanso y el alivio de la articulacin. Puede trabajar con un fisioterapeuta.  Un plan de control del peso.  Tcnicas de [employer]um[employer]ident, como las siguientes: ? Aplicacin de calor y fro en la articulacin. ? Impulsos elctricos aplicados a las terminaciones nerviosas que se encuentran debajo de la piel (neuroestimulacin elctrica transcutnea [TENS]). ? Masajes. ? Ciertos suplementos nutricionales.  Antiiflamatorios no esteroideos o medicamentos recetados para ayudar a Best boy.  Medicamentos para ayudar a Best boy y la inflamacin (corticoesteroides). Estos se pueden administrar por boca (va oral) o mediante una inyeccin.  Dispositivos de [place], como un dispositivo  ortopdico, una frula, un guante especial o un bastn.  Ciruga, como: ? Imelda Pillow. Se hace para reposicionar los huesos y Best boy o para retirar los trozos sueltos de hueso y Database administrator. ? Ciruga de reemplazo articular. Es posible que necesite esta ciruga si tiene una artrosis muy grave (avanzada).  Siga estas instrucciones en su casa: Actividad  Haga descansar las articulaciones afectadas segn las indicaciones del mdico.  No conduzca ni use maquinaria pesada mientras toma analgsicos recetados.  Practique los ejercicios que le indiquen. Es posible que el mdico o el fisioterapeuta le recomienden tipos especficos de ejercicios, tales como: ? Ejercicios de fortalecimiento. Se realizan para fortalecer los msculos que sostienen las articulaciones afectadas por el reumatismo. Pueden realizarse con peso o con bandas para agregar resistencia. ? Actividades Precious Haws. Son ejercicios, como caminar a paso ligero o hacer gimnasia [place] acutica, que aumentan la actividad del corazn. ? Actividades de amplitud de movimientos. [place] articulaciones. ? Ejercicios de equilibrio y [place]. Control del dolor, la rigidez y la hinchazn  Si se lo indican, aplique calor en la zona afectada tan frecuentemente como se lo haya indicado el mdico. Use la fuente de calor que el mdico le recomiende, como una compresa de calor hmedo o una almohadilla trmica. ? Si tiene un dispositivo de ayuda que se puede quitar, quteselo segn lo indicado por su mdico. ? Colquese una toalla entre la piel y la fuente de Freight forwarder. Si el mdico le indica que no se quite el dispositivo de H[employer] Inc se Passenger transport manager, coloque una toalla entre el dispositivo de [place] y la fuente de Freight forwarder. ? Aplique el calor durante 20 a 54minutos. ? Retire la fuente de calor si la piel se le pone de color rojo brillante. Esto es muy importante si no puede sentir el dolor, el calor ni el fro. Puede  correr un riesgo mayor de  sufrir quemaduras.  Si se lo indican, aplique hielo sobre la articulacin afectada: ? Si tiene un dispositivo de ayuda que se puede quitar, quteselo segn lo indicado por su mdico. ? Ponga el hielo en una bolsa plstica. ? Colquese una [employer] piel y la bolsa de hielo. Si el mdico le indica que no se quite el dispositivo de HCA Inc se aplica hielo, coloque una toalla entre el dispositivo de [place] y la bolsa de hielo. ? Coloque el hielo durante 27minutos, 2 o 3veces por da. Instrucciones generales  Delphi de venta libre y los recetados solamente como se lo haya indicado el mdico.  Mantenga un peso saludable. Siga las instrucciones de su mdico con respecto al control del [place]. Estas pueden incluir restricciones en la dieta.  No consuma ningn producto que contenga nicotina o tabaco, como cigarrillos y Psychologist, sport and exercise. Estos pueden retrasar la consolidacin del [place]. Si necesita ayuda para dejar de fumar, consulte al [employer].  Use los dispositivos de [employer] Corporation se lo haya indicado el mdico.  Concurra a todas las visitas de control como se lo haya indicado el mdico. Esto es importante. Dnde encontrar ms informacin:  Air traffic controller de Reumatismo Articular y Arboriculturist Musculoesquelticas y Insurance underwriter [facility] of Arthritis and Musculoskeletal and Skin Diseases): www.niams.SouthExposed.es  [place] ([employer] on Aging): http://kim-miller.com/  Instit[facility] Est[facility]oun[facility] de Reumatologa ([place] of Rheumatology): www.rheumatology.org Comunquese con un mdico si:  La piel se pone roja.  Le aparece una erupcin cutnea.  Siente un dolor que [place].  Tiene fiebre y siente dolor en la articulacin o el msculo. Solicite ayuda de inmediato si:  [employer] Scripts.  Pierde el apetito repentinamente.  Tiene sudoracin nocturna. Resumen  La artrosis es un  tipo de reumatismo articular que afecta el tejido que cubre los extremos de los huesos en las articulaciones (cartlago).  Esta enfermedad es causada por el desgaste del cartlago que cubre los extremos de [place], lo cual tiene relacin con la edad.  Los principales sntomas de esta enfermedad son dolor, hinchazn y Advertising account executive.  No hay cura para esta enfermedad, pero el tratamiento puede ayudar a Financial controller y Teacher, English as a foreign language el funcionamiento de Water engineer. Esta informacin no tiene Marine scientist el consejo del mdico. Asegrese de hacerle al mdico cualquier pregunta que tenga. Document Released: 11/03/2004 Document Revised: 12/21/2015 Document Reviewed: 10/01/2012 Elsevier Interactive Patient Education  2018 Elsevier Inc.      Agustina Caroli, MD [facility]

## 2017-11-07 NOTE — Patient Instructions (Addendum)
If you have lab work done today you will be contacted with your lab results within the next 2 weeks.  If you have not heard from [place] then please contact us. The fastest way to get your results is to register for My Chart.   IF you received an x-ray today, you will receive an invoice from [facility]. Please contact [facility] at [phone] with questions or concerns regarding your invoice.   IF you received labwork today, you will receive an invoice from [place]. Please contact [facility] at [phone] with questions or concerns regarding your invoice.   Our billing staff will not be able to assist you with questions regarding bills from these companies.  You will be contacted with the lab results as soon as they are available. The fastest way to get your results is to activate your My Chart account. Instructions are located on the last page of this paperwork. If you have not heard from [place] regarding the results in 2 weeks, please contact this office.      Artrosis Osteoarthritis La artrosis es un tipo de reumatismo articular que afecta el tejido que cubre los extremos de los huesos en las articulaciones (cartlago). El cartlago acta como amortiguador Monsanto Company y los ayuda a moverse con suavidad. La artrosis se produce cuando el cartlago de las articulaciones se gasta. A veces, la artrosis se denomina reumatismo articular "por uso y desgaste". La artrosis es la forma ms frecuente de reumatismo articular. A menudo, afecta a las The First American. Es una enfermedad que empeora con el tiempo (una enfermedad progresiva). Esta enfermedad afecta con ms frecuencia las articulaciones de:  Los dedos de Marriott.  Los dedos Kellogg.  Las caderas.  Las rodillas.  La columna vertebral, incluido el cuello y la zona lumbar.  Cules son las causas? Esta enfermedad es causada por el desgaste del cartlago que cubre los extremos de [place], lo cual tiene  relacin con la edad. Qu incrementa el riesgo? Los siguientes factores pueden hacer que usted sea ms propenso a tener esta enfermedad:  Edad avanzada.  Tener exceso de [place] u obesidad.  Uso excesivo de las articulaciones, como en el caso de los [place].  Lesin pasada de Insurance claims handler.  Ciruga pasada en una articulacin.  Antecedentes familiares de artrosis.  Cules son los signos o los sntomas? Los principales sntomas de esta enfermedad son dolor, hinchazn y Advertising account executive. Con el tiempo, la articulacin puede perder su forma. Se pueden desprender pequeos trozos de Praxair o cartlago y flotar dentro de la articulacin, lo cual puede causar ms dolor y Agricultural consultant. Pueden formarse pequeos depsitos de hueso (ostefitos) en los extremos de Water engineer. Otros sntomas pueden incluir lo siguiente:  Una sensacin de chirrido o raspado dentro de la articulacin al moverla.  Sonidos de chasquido o crujido al [employer] Communications.  Los sntomas pueden afectar una o ms articulaciones. La artrosis en una articulacin principal, como la rodilla o la cadera, puede causar dolor al caminar o al realizar ejercicio. Si tiene artrosis IAC/InterActiveCorp, es posible que no pueda agarrar objetos, torcer la mano o controlar pequeos movimientos de las manos y los dedos (motricidad fina). Cmo se diagnostica? Esta enfermedad se puede diagnosticar en funcin de lo siguiente:  Sus antecedentes mdicos.  Un examen fsico.  Sus sntomas.  Radiografas de la(s) articulacin(es) afectada(s).  Anlisis de sangre para descartar otros tipos de reumatismo articular.  Cmo se trata?  No hay cura para esta enfermedad, pero el tratamiento puede ayudar a Financial controller y Teacher, English as a foreign language el funcionamiento de Water engineer. Los planes de tratamiento pueden incluir lo siguiente:  Un programa de ejercicios indicado que permita el descanso y el alivio de la articulacin. Puede trabajar con un  fisioterapeuta.  Un plan de control del peso.  Tcnicas de UnumProvident, como las siguientes: ? Aplicacin de calor y fro en la articulacin. ? Impulsos elctricos aplicados a las terminaciones nerviosas que se encuentran debajo de la piel (neuroestimulacin elctrica transcutnea [TENS]). ? Masajes. ? Ciertos suplementos nutricionales.  Antiiflamatorios no esteroideos o medicamentos recetados para ayudar a Best boy.  Medicamentos para ayudar a Best boy y la inflamacin (corticoesteroides). Estos se pueden administrar por boca (va oral) o mediante una inyeccin.  Dispositivos de [place], como un dispositivo ortopdico, una frula, un guante especial o un bastn.  Ciruga, como: ? Imelda Pillow. Se hace para reposicionar los huesos y Best boy o para retirar los trozos sueltos de hueso y Database administrator. ? Ciruga de reemplazo articular. Es posible que necesite esta ciruga si tiene una artrosis muy grave (avanzada).  Siga estas instrucciones en su casa: Actividad  Haga descansar las articulaciones afectadas segn las indicaciones del mdico.  No conduzca ni use maquinaria pesada mientras toma analgsicos recetados.  Practique los ejercicios que le indiquen. Es posible que el mdico o el fisioterapeuta le recomienden tipos especficos de ejercicios, tales como: ? Ejercicios de fortalecimiento. Se realizan para fortalecer los msculos que sostienen las articulaciones afectadas por el reumatismo. Pueden realizarse con peso o con bandas para agregar resistencia. ? Actividades Precious Haws. Son ejercicios, como caminar a paso ligero o hacer gimnasia [place] acutica, que aumentan la actividad del corazn. ? Actividades de amplitud de movimientos. [place] articulaciones. ? Ejercicios de equilibrio y [place]. Control del dolor, la rigidez y la hinchazn  Si se lo indican, aplique calor en la zona afectada tan frecuentemente como se lo haya  indicado el mdico. Use la fuente de calor que el mdico le recomiende, como una compresa de calor hmedo o una almohadilla trmica. ? Si tiene un dispositivo de ayuda que se puede quitar, quteselo segn lo indicado por su mdico. ? Colquese una toalla entre la piel y la fuente de Freight forwarder. Si el mdico le indica que no se quite el dispositivo de HCA Inc se Passenger transport manager, coloque una toalla entre el dispositivo de [place] y la fuente de Freight forwarder. ? Aplique el calor durante 20 a 78minutos. ? Retire la fuente de calor si la piel se le pone de color rojo brillante. Esto es muy importante si no puede sentir el dolor, el calor ni el fro. Puede correr un riesgo mayor de sufrir quemaduras.  Si se lo indican, aplique hielo sobre la articulacin afectada: ? Si tiene un dispositivo de ayuda que se puede quitar, quteselo segn lo indicado por su mdico. ? Ponga el hielo en una bolsa plstica. ? Colquese una [employer]ine [employer] piel y la bolsa de hielo. Si el mdico le indica que no se quite el dispositivo de HCA Inc se aplica hielo, coloque una toalla entre el dispositivo de [place] y la bolsa de hielo. ? Coloque el hielo durante 10minutos, 2 o 3veces por da. Instrucciones generales  Delphi de venta libre y los recetados solamente como se lo haya indicado el mdico.  Mantenga un peso saludable. Siga las instrucciones de su mdico con respecto al control  del peso. Estas pueden incluir restricciones en la dieta.  No consuma ningn producto que contenga nicotina o tabaco, como cigarrillos y Psychologist, sport and exercise. Estos pueden retrasar la consolidacin del [place]. Si necesita ayuda para dejar de fumar, consulte al MeadWestvaco.  Use los dispositivos de Ameren Corporation se lo haya indicado el mdico.  Concurra a todas las visitas de control como se lo haya indicado el mdico. Esto es importante. Dnde encontrar ms informacin:  Air traffic controller de Reumatismo Articular y Arboriculturist  Musculoesquelticas y Insurance underwriter [facility] of Arthritis and Musculoskeletal and Skin Diseases): www.niams.SouthExposed.es  [place] (Lockheed [employer] on Aging): http://kim-miller.com/  Instit[facility] Estadoun[facility] de Reumatologa ([place] of Rheumatology): www.rheumatology.org Comunquese con un mdico si:  La piel se pone roja.  Le aparece una erupcin cutnea.  Siente un dolor que [place].  Tiene fiebre y siente dolor en la articulacin o el msculo. Solicite ayuda de inmediato si:  Express Scripts.  Pierde el apetito repentinamente.  Tiene sudoracin nocturna. Resumen  La artrosis es un tipo de reumatismo articular que afecta el tejido que cubre los extremos de los huesos en las articulaciones (cartlago).  Esta enfermedad es causada por el desgaste del cartlago que cubre los extremos de [place], lo cual tiene relacin con la edad.  Los principales sntomas de esta enfermedad son dolor, hinchazn y Advertising account executive.  No hay cura para esta enfermedad, pero el tratamiento puede ayudar a Financial controller y Teacher, English as a foreign language el funcionamiento de Water engineer. Esta informacin no tiene Marine scientist el consejo del mdico. Asegrese de hacerle al mdico cualquier pregunta que tenga. Document Released: 11/03/2004 Document Revised: 12/21/2015 Document Reviewed: 10/01/2012 Elsevier Interactive Patient Education  [employer]in.

## 2017-11-09 ENCOUNTER — Encounter (INDEPENDENT_AMBULATORY_CARE_PROVIDER_SITE_OTHER): Payer: Self-pay

## 2017-11-09 ENCOUNTER — Ambulatory Visit (INDEPENDENT_AMBULATORY_CARE_PROVIDER_SITE_OTHER): Payer: Medicare Other | Admitting: Emergency Medicine

## 2017-11-09 DIAGNOSIS — R399 Unspecified symptoms and signs involving the genitourinary system: Secondary | ICD-10-CM

## 2017-11-09 DIAGNOSIS — Z8639 Personal history of other endocrine, nutritional and metabolic disease: Secondary | ICD-10-CM

## 2017-11-09 DIAGNOSIS — Z1159 Encounter for screening for other viral diseases: Secondary | ICD-10-CM

## 2017-11-10 LAB — LIPID PANEL
CHOL/HDL RATIO: 7 ratio — AB (ref 0.0–4.4)
Cholesterol, Total: 393 mg/dL — ABNORMAL HIGH (ref 100–199)
HDL: 56 mg/dL (ref >39)
LDL Calculated: 301 mg/dL — ABNORMAL HIGH (ref 0–99)
TRIGLYCERIDES: 178 mg/dL — AB (ref 0–149)
VLDL Cholesterol Cal: 36 mg/dL (ref 5–40)

## 2017-11-10 LAB — CBC WITH DIFFERENTIAL/PLATELET
Basophils Absolute: 0 10*3/uL (ref 0.0–0.2)
Basos: 1 %
EOS (ABSOLUTE): 0.2 10*3/uL (ref 0.0–0.4)
EOS: 4 %
HEMATOCRIT: 41.9 % (ref 34.0–46.6)
HEMOGLOBIN: 13 g/dL (ref 11.1–15.9)
IMMATURE GRANS (ABS): 0 10*3/uL (ref 0.0–0.1)
IMMATURE GRANULOCYTES: 0 %
Lymphocytes Absolute: 2.3 10*3/uL (ref 0.7–3.1)
Lymphs: 46 %
MCH: 26.8 pg (ref 26.6–33.0)
MCHC: 31 g/dL — ABNORMAL LOW (ref 31.5–35.7)
MCV: 86 fL (ref 79–97)
MONOCYTES: 10 %
Monocytes Absolute: 0.5 10*3/uL (ref 0.1–0.9)
NEUTROS PCT: 39 %
Neutrophils Absolute: 2 10*3/uL (ref 1.4–7.0)
Platelets: 207 10*3/uL (ref 150–450)
RBC: 4.85 x10E6/uL (ref 3.77–5.28)
RDW: 13.3 % (ref 12.3–15.4)
WBC: 5 10*3/uL (ref 3.4–10.8)

## 2017-11-10 LAB — HEMOGLOBIN A1C
Est. average glucose Bld gHb Est-mCnc: 126 mg/dL
Hgb A1c MFr Bld: 6 % — ABNORMAL HIGH (ref 4.8–5.6)

## 2017-11-10 LAB — COMPREHENSIVE METABOLIC PANEL
ALBUMIN: 4.5 g/dL (ref 3.6–4.8)
ALK PHOS: 73 IU/L (ref 39–117)
ALT: 40 IU/L — ABNORMAL HIGH (ref 0–32)
AST: 28 IU/L (ref 0–40)
Albumin/Globulin Ratio: 2 (ref 1.2–2.2)
BUN / CREAT RATIO: 15 (ref 12–28)
BUN: 13 mg/dL (ref 8–27)
Bilirubin Total: 0.5 mg/dL (ref 0.0–1.2)
CO2: 24 mmol/L (ref 20–29)
CREATININE: 0.87 mg/dL (ref 0.57–1.00)
Calcium: 9.7 mg/dL (ref 8.7–10.3)
Chloride: 101 mmol/L (ref 96–106)
GFR calc Af Amer: 81 mL/min/{1.73_m2} (ref >59)
GFR, EST NON AFRICAN AMERICAN: 71 mL/min/{1.73_m2} (ref >59)
GLOBULIN, TOTAL: 2.2 g/dL (ref 1.5–4.5)
GLUCOSE: 110 mg/dL — AB (ref 65–99)
Potassium: 4.4 mmol/L (ref 3.5–5.2)
SODIUM: 142 mmol/L (ref 134–144)
Total Protein: 6.7 g/dL (ref 6.0–8.5)

## 2017-11-10 LAB — HEPATITIS C ANTIBODY

## 2017-11-12 ENCOUNTER — Other Ambulatory Visit: Payer: Self-pay | Admitting: Emergency Medicine

## 2017-11-12 DIAGNOSIS — E785 Hyperlipidemia, unspecified: Secondary | ICD-10-CM

## 2017-11-12 MED ORDER — ROSUVASTATIN CALCIUM 20 MG PO TABS
20.0000 mg | ORAL_TABLET | Freq: Every day | ORAL | 3 refills | Status: DC
Start: 2017-11-12 — End: 2018-12-05

## 2017-11-13 ENCOUNTER — Encounter: Payer: Self-pay | Admitting: *Deleted

## 2017-11-15 ENCOUNTER — Ambulatory Visit (INDEPENDENT_AMBULATORY_CARE_PROVIDER_SITE_OTHER): Payer: Medicare Other | Admitting: Orthopedic Surgery

## 2017-11-15 ENCOUNTER — Ambulatory Visit (INDEPENDENT_AMBULATORY_CARE_PROVIDER_SITE_OTHER): Payer: Medicare Other

## 2017-11-15 ENCOUNTER — Encounter (INDEPENDENT_AMBULATORY_CARE_PROVIDER_SITE_OTHER): Payer: Self-pay | Admitting: Orthopedic Surgery

## 2017-11-15 ENCOUNTER — Ambulatory Visit (INDEPENDENT_AMBULATORY_CARE_PROVIDER_SITE_OTHER): Payer: Self-pay

## 2017-11-15 VITALS — BP 132/67 | HR 56 | Resp 12 | Ht 62.5 in | Wt 171.0 lb

## 2017-11-15 DIAGNOSIS — M1712 Unilateral primary osteoarthritis, left knee: Secondary | ICD-10-CM | POA: Diagnosis not present

## 2017-11-15 DIAGNOSIS — M25562 Pain in left knee: Secondary | ICD-10-CM | POA: Diagnosis not present

## 2017-11-15 DIAGNOSIS — M25561 Pain in right knee: Secondary | ICD-10-CM

## 2017-11-15 DIAGNOSIS — M1711 Unilateral primary osteoarthritis, right knee: Secondary | ICD-10-CM | POA: Diagnosis not present

## 2017-11-15 MED ORDER — DICLOFENAC SODIUM 1 % TD GEL: 2.0000 g | Freq: Four times a day (QID) | TRANSDERMAL | 2 refills | Status: DC

## 2017-11-15 NOTE — Progress Notes (Signed)
Office Visit Note   Patient: Christine Hawkins           Date of Birth: 03-Nov-1955           MRN: [id number] Visit Date: 11/15/2017              Requested by: Horald Pollen, MD [place], [place] [id number] PCP: Horald Pollen, MD   Assessment & Plan: Visit Diagnoses:  1. Acute pain of both knees   2. Unilateral primary osteoarthritis, left knee   3. Unilateral primary osteoarthritis, right knee     Plan:  #1: She is not interested in any cortisone injections at this time. #2: I will give her a prescription for Voltaren gel which she may use on both her knees to see if it is beneficial.  If it is no further treatment is necessary.  If not then she can return and try corticosteroid injection and then if that fails MRI scan.  Follow-Up Instructions: Return in about 3 weeks (around 12/06/2017).   Face-to-face time spent with patient was greater than 45 minutes.  Greater than 50% of the time was spent in counseling and coordination of care. She spoke Spanish and we had to use an interpreter by phone to complete the visit. Orders:  Orders Placed This Encounter  Procedures  . XR KNEE 3 VIEW LEFT  . XR KNEE 3 VIEW RIGHT   Meds ordered this encounter  Medications  . diclofenac sodium (VOLTAREN) 1 % GEL    Sig: Apply 2 g topically 4 (four) times daily.    Dispense:  3 Tube    Refill:  2    Order Specific Question:   Supervising Provider    Answer:   Garald Balding [[id number]]      Procedures: No procedures performed   Clinical Data: No additional findings.   Subjective: Chief Complaint  Patient presents with  . Neck - Pain    Pain has been going on for 6 years. Has a history of a previous surgery.   . Right Hand - Pain, Numbness, Edema  . Left Hand - Pain, Numbness, Edema  . Left Knee - Pain  . Right Knee - Pain  . Neck Pain    Neck pain x 6 months, no injury, surgery x 6 years [facility], constant pain, difficulty sleeping at night, limited range of  motion with left, stiffness, Tylenol helps  . Hand Pain    Bil hand pain x 2 months, no injury, right hand numbness, weakness, swelling in mornings, no hand surgery, no hand surgery, not diabetic  . Knee Pain    Bil knee pain x 2 weeks, no injury, popping, clicking and grinding, no knee surgery, difficulty walkiing    HPI  Christine Hawkins is a 62 year old female from [place] who speaks limited [place].  She presents to our office with multiple complaints.  She started having some neck pain about 6 months ago but has had previous surgery at [facility].  She is also complaining of bilateral knee pain.  This was pain noted at the joint lines and into the proximal tibia.  More on the medial bilaterally.  She denies any history of injury or trauma.  Denies any swelling.  Seen today for evaluation.  We had to have interpreter from [facility] Via phone.  Review of Systems  Constitutional: Positive for fatigue.  HENT: Negative for trouble swallowing.   Eyes: Negative for pain.  Respiratory: Negative for shortness of breath.  Cardiovascular: Positive for leg swelling.  Gastrointestinal: Positive for constipation.  Endocrine: Positive for heat intolerance.  Genitourinary: Negative for difficulty urinating.  Musculoskeletal: Positive for back pain, gait problem, joint swelling, neck pain and neck stiffness.  Skin: Positive for rash.  Allergic/Immunologic: Positive for food allergies.  Neurological: Positive for weakness and numbness.  Hematological: Does not bruise/bleed easily.  Psychiatric/Behavioral: Positive for sleep disturbance.     Objective: Vital Signs: BP 132/67 (BP Location: Left Arm, Patient Position: Sitting, Cuff Size: Small)   Pulse (!) 56   Resp 12   Ht 5' 2.5" (1.588 m)   Wt 171 lb (77.6 kg)   BMI 30.78 kg/m   Physical Exam  Constitutional: She is oriented to person, place, and time. She appears well-developed and well-nourished.  HENT:  Mouth/Throat: Oropharynx is clear and moist.    Eyes: Pupils are equal, round, and reactive to light. EOM are normal.  Pulmonary/Chest: Effort normal.  Neurological: She is alert and oriented to person, place, and time.  Skin: Skin is warm and dry.  Psychiatric: She has a normal mood and affect. Her behavior is normal.    Ortho Exam  Exam today reveals the knees to have skin intact bilaterally.  No ecchymosis or erythema is noted.  She sits comfortably.  Bilaterally she has range of motion from 0 degrees to about 115degrees.  She does have patellofemoral crepitance with range of motion of the knees.  She is ligamentously stable.  Tender over the medial joint lines bilaterally.  Smooth motion of both hips without crepitance.  Neurovascular intact distally.  Calves are supple nontender.  Specialty Comments:  No specialty comments available.  Imaging: Xr Knee 3 View Left  Result Date: 11/15/2017 Three-view x-ray of the left knee reveals some mild joint space narrowing of the medial compartment.  Some sclerosing of the medial tibial plateau is also noted.  Lateral positioning of the patella also noted.  Periarticular spurring of the lateral patellar border.  Xr Knee 3 View Right  Result Date: 11/15/2017 Three-view x-ray of the right knee reveals increased narrowing of the joint space medially.  Some cystic changes noted at the lateral tibial plateau on the AP.  Also has some sclerosing of the medial tibial plateau.  Lateral positioning of the patella with narrowing of the lateral patellofemoral joint.    PMFS History: Current Outpatient Medications  Medication Sig Dispense Refill  . aspirin EC 81 MG tablet Take 81 mg by mouth daily.    . [place]-Glucosamine-Vit D (GLUCOSAMINE COMPLEX PO) Take 1 tablet by mouth daily.    . Calcium Carbonate-Vitamin D (CALCIUM + D PO) Take 1 tablet by mouth 2 (two) times daily.    Marland Kitchen ezetimibe (ZETIA) 10 MG tablet Take 10 mg by mouth daily.    . Flaxseed, Linseed, (FLAX SEED OIL PO) Take 1 capsule by  mouth 3 (three) times daily.    . Omega-3 Fatty Acids (OMEGA 3 PO) Take by mouth daily.    . pantoprazole (PROTONIX) 40 MG tablet Take by mouth.    Marland Kitchen PARoxetine (PAXIL) 20 MG tablet Take 40 mg by mouth daily.     . rosuvastatin (CRESTOR) 20 MG tablet Take 1 tablet (20 mg total) by mouth daily. 90 tablet 3  . ALPRAZolam (XANAX) 0.25 MG tablet Take 0.25 mg by mouth 3 (three) times daily as needed for sleep or anxiety.    . diclofenac sodium (VOLTAREN) 1 % GEL Apply 2 g topically 4 (four) times daily. 3 Tube 2  .  metoprolol tartrate (LOPRESSOR) 25 MG tablet Take 25 mg by mouth 2 (two) times daily.    . mirtazapine (REMERON) 15 MG tablet Take 15 mg by mouth at bedtime.    . nitroGLYCERIN (NITROSTAT) 0.4 MG SL tablet Place 0.4 mg under the tongue every 5 (five) minutes as needed for chest pain.     No current facility-administered medications for this visit.     Patient Active Problem List   Diagnosis Date Noted  . Chronic neck pain 11/07/2017  . History of high cholesterol 11/07/2017  . Lower urinary tract symptoms (LUTS) 11/07/2017  . Osteoarthritis involving multiple joints on both sides of body 12/01/2006  . HYPERLIPIDEMIA 05/12/2006  . COLONIC POLYPS, BENIGN, HX OF 05/12/2006   Past Medical History:  Diagnosis Date  . Depression   . High cholesterol     No family history on file.  Past Surgical History:  Procedure Laterality Date  . BREAST LUMPECTOMY    . CERVICAL SPINE SURGERY    . CESAREAN SECTION    . LEFT HEART CATHETERIZATION WITH CORONARY ANGIOGRAM N/A 06/07/2012   Procedure: LEFT HEART CATHETERIZATION WITH CORONARY ANGIOGRAM;  Surgeon: Laverda Page, MD;  Location: [facility] CATH LAB;  Service: Cardiovascular;  Laterality: N/A;   Social History   Occupational History  . Not on file  Tobacco Use  . Smoking status: Never Smoker  . Smokeless tobacco: Never Used  Substance and Sexual Activity  . Alcohol use: Not Currently  . Drug use: Not Currently  . Sexual activity: Not  on file

## 2017-11-24 LAB — HM MAMMOGRAPHY

## 2017-12-04 ENCOUNTER — Ambulatory Visit (INDEPENDENT_AMBULATORY_CARE_PROVIDER_SITE_OTHER): Payer: Medicare Other | Admitting: Orthopaedic Surgery

## 2017-12-08 ENCOUNTER — Encounter: Payer: Self-pay | Admitting: *Deleted

## 2017-12-12 DIAGNOSIS — M25512 Pain in left shoulder: Secondary | ICD-10-CM | POA: Insufficient documentation

## 2017-12-13 ENCOUNTER — Telehealth: Payer: Self-pay | Admitting: Emergency Medicine

## 2017-12-13 NOTE — Telephone Encounter (Signed)
12/13/2017 - PATIENT CAME BY THE OFFICE TO ASK FOR A REFILL ON HER PAROXETINE 20 mg. SHE TAKES 2 PILLS DAILY. I INFORMED HER THAT I WOULD HAVE TO PUT IN A TELEPHONE MESSAGE TO DR. MIGUEL. SHE ALSO DROPPED OFF A APPLICATION FOR RENEWAL OF HER PARKING PLACARD. I WILL PUT THE FORM IN DR. MIGUEL'S CUBBY HOLE AT THE NURSE'S STATION.  BEST PHONE WHEN FORM IS READY TO BE PICKED UP IS: [phone] (CELL) PHARMACY CHOICE IS [facility] IN [place], [address] [place]

## 2017-12-15 NOTE — Telephone Encounter (Signed)
Message re: refill Paroxetine 20mg   And Renewal forms of parking placard Both sent to Dr. Agustina Caroli

## 2017-12-18 MED ORDER — PAROXETINE HCL 20 MG PO TABS: 40.0000 mg | ORAL_TABLET | Freq: Every day | ORAL | 1 refills | Status: DC

## 2017-12-18 NOTE — Telephone Encounter (Signed)
Paxil refilled. Thanks.

## 2017-12-22 ENCOUNTER — Telehealth: Payer: Self-pay | Admitting: *Deleted

## 2017-12-22 NOTE — Telephone Encounter (Signed)
Left message in mobile voice mail handicap placard is ready for pickup at the check in desk.

## 2018-01-12 ENCOUNTER — Other Ambulatory Visit (INDEPENDENT_AMBULATORY_CARE_PROVIDER_SITE_OTHER): Payer: Self-pay | Admitting: Orthopedic Surgery

## 2018-01-29 ENCOUNTER — Ambulatory Visit (INDEPENDENT_AMBULATORY_CARE_PROVIDER_SITE_OTHER): Payer: Medicare Other | Admitting: Family Medicine

## 2018-01-29 ENCOUNTER — Other Ambulatory Visit: Payer: Self-pay

## 2018-01-29 ENCOUNTER — Encounter: Payer: Self-pay | Admitting: Family Medicine

## 2018-01-29 VITALS — BP 137/85 | HR 62 | Temp 98.4°F | Resp 18 | Ht 63.9 in | Wt 172.2 lb

## 2018-01-29 DIAGNOSIS — R058 Other specified cough: Secondary | ICD-10-CM

## 2018-01-29 DIAGNOSIS — Z2839 Other underimmunization status: Secondary | ICD-10-CM

## 2018-01-29 DIAGNOSIS — Z23 Encounter for immunization: Secondary | ICD-10-CM | POA: Diagnosis not present

## 2018-01-29 DIAGNOSIS — L299 Pruritus, unspecified: Secondary | ICD-10-CM

## 2018-01-29 DIAGNOSIS — R059 Cough, unspecified: Secondary | ICD-10-CM

## 2018-01-29 DIAGNOSIS — R05 Cough: Secondary | ICD-10-CM

## 2018-01-29 DIAGNOSIS — J019 Acute sinusitis, unspecified: Secondary | ICD-10-CM | POA: Diagnosis not present

## 2018-01-29 DIAGNOSIS — Z283 Underimmunization status: Secondary | ICD-10-CM

## 2018-01-29 MED ORDER — BENZONATATE 100 MG PO CAPS: 100.0000 mg | ORAL_CAPSULE | Freq: Three times a day (TID) | ORAL | 0 refills | Status: DC | PRN

## 2018-01-29 MED ORDER — AZITHROMYCIN 250 MG PO TABS: ORAL_TABLET | ORAL | 0 refills | Status: DC

## 2018-01-29 MED ORDER — PREDNISONE 20 MG PO TABS: ORAL_TABLET | ORAL | 0 refills | Status: DC

## 2018-01-29 NOTE — Progress Notes (Signed)
Ruben Reason, MD 12/23/2019Patient ID: Christine Hawkins, female    DOB: 04-03-1955  Age: 62 y.o. MRN: [id number]  Chief Complaint  Patient presents with  . Cough  . Nasal Congestion    X 2 weeks - with chest chest congestion    Subjective:  62 year old Hispanic American female who is accompanied by her granddaughter.  The patient speaks some English, but the granddaughter fills in a few things.  The patient has had a flulike cough and congestion since 11 December.  She persists with a cough and some postnasal drainage.  She coughs up yellow-green mucus.  She is not wheezing.  She does not smoke.  She has not had a flu shot.  She does not have a history of having a lot of these infections.  No one else at home is ill.  She is not diabetic.  Current allergies, medications, problem list, past/family and social histories reviewed.  Objective:  BP 137/85   Pulse 62   Temp 98.4 F (36.9 C) (Oral)   Resp 18   Ht 5' 3.9" (1.623 m)   Wt 172 lb 3.2 oz (78.1 kg)   SpO2 95%   BMI 29.65 kg/m   Pleasant lady, alert and oriented.  TMs are normal.  She says her ears itch.  Her sinuses are nontender.  Throat clear.  Neck supple without nodes.  Chest is clear to auscultation.  Heart regular without murmurs.  Assessment & Plan:   Assessment: 1. Post-viral cough syndrome   2. Acute non-recurrent sinusitis, unspecified location   3. Cough   4. Ear itching   5. Needs flu shot   6. Immunization deficiency       Plan: This is a post viral cough syndrome.  Orders Placed This Encounter  Procedures  . Flu Vaccine QUAD 6+ mos PF IM (Fluarix Quad PF)  . Pneumococcal polysaccharide vaccine 23-valent greater than or equal to 2yo subcutaneous/IM    Meds ordered this encounter  Medications  . predniSONE (DELTASONE) 20 MG tablet    Sig: Take 2 daily for 3 days.  Take after breakfast.    Dispense:  6 tablet    Refill:  0  . azithromycin (ZITHROMAX) 250 MG tablet    Sig: Take 2 tabs PO x 1 dose,  then 1 tab PO QD x 4 days    Dispense:  6 tablet    Refill:  0  . benzonatate (TESSALON) 100 MG capsule    Sig: Take 1-2 capsules (100-200 mg total) by mouth 3 (three) times daily as needed.    Dispense:  30 capsule    Refill:  0         Patient Instructions   Drink of fluids and get enough rest.  Water, juice, etc.  Take a azithromycin 2 today, then 1 daily for 4 days (antibiotic)  Take prednisone 20 mg 2 pills each morning for 3 days.  Best taken after eating.  Use the benzonatate cough pills 1 or 2 pills 3 times daily as needed for cough  In addition to this you can take over-the-counter Mucinex DM or Robitussin-DM or a generic of 1 of them.  No x-rays or other studies are needed today, but if you are not improving over the next week or 2 please return.  You can try getting a little over-the-counter 1% hydrocortisone cream and rub rubbing a small amount of it in your ears every day for 7 to 10 days to try to relieve the itching.  If you have lab work done today you will be contacted with your lab results within the next 2 weeks.  If you have not heard from [place] then please contact us. The fastest way to get your results is to register for My Chart.   IF you received an x-ray today, you will receive an invoice from [facility]. Please contact [facility] at [phone] with questions or concerns regarding your invoice.   IF you received labwork today, you will receive an invoice from [place]. Please contact [facility] at [phone] with questions or concerns regarding your invoice.   Our billing staff will not be able to assist you with questions regarding bills from these companies.  You will be contacted with the lab results as soon as they are available. The fastest way to get your results is to activate your My Chart account. Instructions are located on the last page of this paperwork. If you have not heard from [place] regarding the results in 2 weeks,  please contact this office.        Return if symptoms worsen or fail to improve.   Ruben Reason, MD 01/29/2018

## 2018-01-29 NOTE — Patient Instructions (Addendum)
Drink of fluids and get enough rest.  Water, juice, etc.  Take a azithromycin 2 today, then 1 daily for 4 days (antibiotic)  Take prednisone 20 mg 2 pills each morning for 3 days.  Best taken after eating.  Use the benzonatate cough pills 1 or 2 pills 3 times daily as needed for cough  In addition to this you can take over-the-counter Mucinex DM or Robitussin-DM or a generic of 1 of them.  No x-rays or other studies are needed today, but if you are not improving over the next week or 2 please return.  You can try getting a little over-the-counter 1% hydrocortisone cream and rub rubbing a small amount of it in your ears every day for 7 to 10 days to try to relieve the itching.  If you have lab work done today you will be contacted with your lab results within the next 2 weeks.  If you have not heard from [place] then please contact us. The fastest way to get your results is to register for My Chart.   IF you received an x-ray today, you will receive an invoice from [facility]. Please contact [facility] at [phone] with questions or concerns regarding your invoice.   IF you received labwork today, you will receive an invoice from [place]. Please contact [facility] at [phone] with questions or concerns regarding your invoice.   Our billing staff will not be able to assist you with questions regarding bills from these companies.  You will be contacted with the lab results as soon as they are available. The fastest way to get your results is to activate your My Chart account. Instructions are located on the last page of this paperwork. If you have not heard from [place] regarding the results in 2 weeks, please contact this office.

## 2018-02-13 ENCOUNTER — Ambulatory Visit: Payer: Medicare Other | Admitting: Emergency Medicine

## 2018-02-22 ENCOUNTER — Ambulatory Visit: Payer: Medicare Other | Admitting: Emergency Medicine

## 2018-02-23 ENCOUNTER — Ambulatory Visit (INDEPENDENT_AMBULATORY_CARE_PROVIDER_SITE_OTHER): Payer: Medicare Other | Admitting: Family Medicine

## 2018-02-23 ENCOUNTER — Encounter: Payer: Self-pay | Admitting: Family Medicine

## 2018-02-23 ENCOUNTER — Other Ambulatory Visit: Payer: Self-pay

## 2018-02-23 VITALS — BP 132/78 | HR 60 | Temp 98.3°F | Ht 63.9 in | Wt 174.2 lb

## 2018-02-23 DIAGNOSIS — L243 Irritant contact dermatitis due to cosmetics: Secondary | ICD-10-CM | POA: Diagnosis not present

## 2018-02-23 MED ORDER — MOMETASONE FUROATE 0.1 % EX SOLN: Freq: Every day | CUTANEOUS | 0 refills | Status: DC

## 2018-02-23 MED ORDER — CETIRIZINE HCL 10 MG PO TABS: 10.0000 mg | ORAL_TABLET | Freq: Every day | ORAL | 0 refills | Status: DC

## 2018-02-23 NOTE — Progress Notes (Signed)
1/17/20208:46 AM  Christine Hawkins Jun 08, 1955, 63 y.o. female [id number]  Chief Complaint  Patient presents with  . Follow-up    last visit was due to coughing, no longer having the coughing.   . Hair/Scalp Problem    recently changed shampoo to loreal, now having scalp irritation. No longer using this product. Having drainage, buring and bleeding from the scalp for 1 wk    HPI:   Patient is a 63 y.o. female with past medical history significant for HLP and depression who presents today for rash on scalp  Seen on 01/29/18 for postviral cough syndrome, treated with azithromycin, prednisone and tessalon pearles - all symptoms resolved  About a week ago changed hair products Since then has had bumpy rash with burning Not itchy Clear drainage when she squeezes Denies any previous episodes She stopped using hair products Has not used anything to help with symptoms   Fall Risk  02/23/2018 01/29/2018 11/07/2017  Falls in the past year? 0 0 No     Depression screen [facility] 2/9 02/23/2018 01/29/2018 11/07/2017  Decreased Interest 0 0 0  Down, Depressed, Hopeless 0 0 0  PHQ - 2 Score 0 0 0    Allergies  Allergen Reactions  . Lactose Intolerance (Gi)   . Bupropion Nausea Only  . Niaspan [Niacin] Rash    Itching    Prior to Admission medications   Medication Sig Start Date End Date Taking? Authorizing Provider  aspirin EC 81 MG tablet Take 81 mg by mouth daily.   Yes [provider]  Boswellia-Glucosamine-Vit D (GLUCOSAMINE COMPLEX PO) Take 1 tablet by mouth daily.   Yes [provider]  Calcium Carbonate-Vitamin D (CALCIUM + D PO) Take 1 tablet by mouth 2 (two) times daily.   Yes [provider]  diclofenac sodium (VOLTAREN) 1 % GEL Apply 2 g topically 4 (four) times daily. 11/15/17  Yes Petrarca, Mike Craze, PA-C  mirtazapine (REMERON) 15 MG tablet Take 15 mg by mouth at bedtime.   Yes [provider]  Omega-3 Fatty Acids (OMEGA 3 PO) Take by mouth  daily.   Yes [provider]  pantoprazole (PROTONIX) 40 MG tablet Take by mouth. 08/03/15  Yes [provider]  PARoxetine (PAXIL) 20 MG tablet Take 2 tablets (40 mg total) by mouth daily. 12/18/17 03/18/18 Yes Sagardia, Ines Bloomer, MD  predniSONE (DELTASONE) 20 MG tablet Take 2 daily for 3 days.  Take after breakfast. 01/29/18  Yes Posey Boyer, MD  rosuvastatin (CRESTOR) 20 MG tablet Take 1 tablet (20 mg total) by mouth daily. 11/12/17  Yes Horald Pollen, MD    Past Medical History:  Diagnosis Date  . Depression   . High cholesterol     Past Surgical History:  Procedure Laterality Date  . BREAST LUMPECTOMY    . CERVICAL SPINE SURGERY    . CESAREAN SECTION    . LEFT HEART CATHETERIZATION WITH CORONARY ANGIOGRAM N/A 06/07/2012   Procedure: LEFT HEART CATHETERIZATION WITH CORONARY ANGIOGRAM;  Surgeon: Laverda Page, MD;  Location: [facility] CATH LAB;  Service: Cardiovascular;  Laterality: N/A;    Social History   Tobacco Use  . Smoking status: Never Smoker  . Smokeless tobacco: Never Used  Substance Use Topics  . Alcohol use: Not Currently    History reviewed. No pertinent family history.  ROS   OBJECTIVE:  Blood pressure 132/78, pulse 60, temperature 98.3 F (36.8 C), temperature source Oral, height 5' 3.9" (1.623 m), weight 174 lb 3.2  oz (79 kg), SpO2 98 %. Body mass index is 30 kg/m.   Physical Exam Vitals signs and nursing note reviewed.  Constitutional:      Appearance: She is well-developed.  HENT:     Head: Normocephalic and atraumatic.  Eyes:     General: No scleral icterus.    Conjunctiva/sclera: Conjunctivae normal.     Pupils: Pupils are equal, round, and reactive to light.  Neck:     Musculoskeletal: Neck supple.  Pulmonary:     Effort: Pulmonary effort is normal.  Skin:    General: Skin is warm and dry.     Findings: Rash (scalp with scattered erythematous papular rash ) present.  Neurological:     Mental Status: She is  alert and oriented to person, place, and time.     ASSESSMENT and PLAN 1. Irritant contact dermatitis due to cosmetics Discussed supportive measures, new meds r/se/b and RTC precautions. Patient educational handout given. Other orders - mometasone (ELOCON) 0.1 % lotion; Apply topically daily. - cetirizine (ZYRTEC) 10 MG tablet; Take 1 tablet (10 mg total) by mouth daily.  Return if symptoms worsen or fail to improve.    Rutherford Guys, MD [facility] [address], [place] [address] Ph.  [phone] Fax [phone]

## 2018-02-23 NOTE — Patient Instructions (Signed)
Dermatitis de contacto  Contact Dermatitis  La dermatitis es el enrojecimiento, el dolor y la hinchazn (inflamacin) de la piel. La dermatitis de contacto es una reaccin a algo que toca la piel.  Hay dos tipos de dermatitis de contacto:   Dermatitis de contacto irritativa. Esto ocurre cuando algo molesta (irrita) la piel, como el jabn.   Dermatitis de contacto alrgica. Esto se produce cuando una persona se expone a algo a lo que es alrgica, como la hiedra venenosa.  Cules son las causas?   Las causas frecuentes de la dermatitis de contacto irritante incluyen las siguientes:  ? Maquillaje.  ? Jabones.  ? Detergentes.  ? Lavandina.  ? cidos.  ? Metales, como el nquel.   Las causas frecuentes de la dermatitis de contacto alrgica incluyen las siguientes:  ? Plantas.  ? Productos qumicos.  ? Alhajas.  ? Ltex.  ? Medicamentos.  ? Conservantes que se utilizan en determinados productos, como la ropa.  Qu incrementa el riesgo?   Tener un trabajo que lo expone a cosas que causan molestias en la piel.   Tener asma o eczema.  Cules son los signos o los sntomas?  Los sntomas pueden ocurrir en cualquier parte de la piel que la sustancia irritante haya tocado. Algunos de los sntomas son los siguientes:   Piel seca o descamada.   Enrojecimiento.   Grietas.   Picazn.   Dolor o sensacin de ardor.   Ampollas.   Sangre o lquido transparente que drena de las grietas de la piel.  En el caso de la dermatitis de contacto alrgica, puede haber hinchazn. Esto puede ocurrir en lugares como los prpados, la boca o los genitales.  Cmo se trata?   Esta afeccin se trata mediante un control para detectar la causa de la reaccin y proteger la piel. El tratamiento tambin puede incluir lo siguiente:  ? Ungentos, medicamentos o cremas con corticoesteroides.  ? Antibiticos u otros ungentos, si tiene una infeccin en la piel.  ? Lociones o medicamentos para aliviar la picazn.  ? Una venda (vendaje).  Siga  estas indicaciones en su casa:  Cuidado de la piel   Humctese la piel segn sea necesario.   Aplique compresas fras sobre la piel.   Pngase una pasta de bicarbonato de sodio sobre la piel. Agregue agua al bicarbonato de sodio hasta que parezca una pasta.   No se rasque la piel.   Evite que las cosas le rocen la piel.   Evite el uso de jabones, perfumes y tintes.  Medicamentos   Tome o aplique los medicamentos de venta libre y los recetados solamente como se lo haya indicado el mdico.   Si le recetaron un antibitico, tmelo o aplquelo como se lo haya indicado el mdico. No deje de usarlo aunque la afeccin empiece a mejorar.  Baarse   Tome un bao con:  ? Sales de [place].  ? Bicarbonato de sodio.  ? Avena coloidal.   Bese con menos frecuencia.   Bese con agua tibia. No use agua caliente.  Cuidado de la venda   Si le colocaron una venda, cmbiela como se lo haya indicado el mdico.   Lvese las manos con agua y jabn antes y despus de cambiarse la venda. Use desinfectante para manos si no dispone de agua y jabn.  Indicaciones generales   Evite las cosas que le causaron la reaccin. Si no sabe qu la caus, lleve un diario. Escriba los siguientes datos:  ? Lo que   come.  ? Los productos para la piel que usa.  ? Lo que bebe.  ? Lo que lleva puesto en la zona que tiene los sntomas. Esto incluye las alhajas.   Controle todos los das las zonas afectadas para detectar signos de infeccin. Est atento a los siguientes signos:  ? Aumento del enrojecimiento, la hinchazn o el dolor.  ? Ms lquido o sangre.  ? Calor.  ? Pus o mal olor.   Concurra a todas las visitas de seguimiento como se lo haya indicado el mdico. Esto es importante.  Comunquese con un mdico si:   No mejora con el tratamiento.   Su afeccin empeora.   Tiene signos de infeccin, como los siguientes:  ? Aumento de la hinchazn.  ? Dolor a la palpacin.  ? Aumento del enrojecimiento.  ? Molestias.  ? Calor.   Tiene  fiebre.   Aparecen nuevos sntomas.  Solicite ayuda inmediatamente si:   Tiene un dolor de cabeza muy intenso.   Siente dolor en el cuello.   Tiene el cuello rgido.   Vomita.   Se siente muy somnoliento.   Nota unas lneas rojas en la piel que salen de la zona.   El hueso o la articulacin que se encuentran cerca de la zona le duelen despus de que la piel se cura.   La zona se oscurece.   Tiene dificultad para respirar.  Resumen   La dermatitis es el enrojecimiento, el dolor y la hinchazn de la piel.   Los sntomas pueden ocurrir en donde la sustancia irritante lo ha tocado.   El tratamiento puede incluir medicamentos y cuidado de la piel.   Si no conoce la causa de la reaccin alrgica, lleve un diario.   Comunquese con un mdico si su afeccin empeora o si tiene signos de infeccin.  Esta informacin no tiene como fin reemplazar el consejo del mdico. Asegrese de hacerle al mdico cualquier pregunta que tenga.  Document Released: 09/23/2010 Document Revised: 09/27/2017 Document Reviewed: 09/27/2017  Elsevier Interactive Patient Education  2019 Elsevier Inc.

## 2018-03-17 ENCOUNTER — Other Ambulatory Visit: Payer: Self-pay | Admitting: Family Medicine

## 2018-04-21 ENCOUNTER — Other Ambulatory Visit: Payer: Self-pay | Admitting: Family Medicine

## 2018-05-20 ENCOUNTER — Other Ambulatory Visit: Payer: Self-pay | Admitting: Family Medicine

## 2018-06-20 ENCOUNTER — Other Ambulatory Visit: Payer: Self-pay | Admitting: Emergency Medicine

## 2018-06-20 NOTE — Telephone Encounter (Signed)
Is this okay to refill? 

## 2018-06-20 NOTE — Telephone Encounter (Signed)
Refill request for paroxetine HCl 20 mg tab.  Prescription ended on 03/18/18. Last refill was 12/18/2017.  Dr. Mitchel Honour LOV  02/23/2018 No upcoming appointment.

## 2018-06-25 ENCOUNTER — Other Ambulatory Visit: Payer: Self-pay | Admitting: Emergency Medicine

## 2018-07-23 ENCOUNTER — Other Ambulatory Visit: Payer: Self-pay | Admitting: Emergency Medicine

## 2018-07-23 NOTE — Telephone Encounter (Signed)
Pending ov on 6/23, how would you like to proceed with refill

## 2018-07-31 ENCOUNTER — Other Ambulatory Visit: Payer: Self-pay

## 2018-07-31 ENCOUNTER — Ambulatory Visit (INDEPENDENT_AMBULATORY_CARE_PROVIDER_SITE_OTHER): Payer: Medicare Other | Admitting: Emergency Medicine

## 2018-07-31 ENCOUNTER — Encounter: Payer: Self-pay | Admitting: Emergency Medicine

## 2018-07-31 VITALS — BP 146/72 | HR 63 | Temp 98.4°F | Ht 62.0 in | Wt 171.4 lb

## 2018-07-31 DIAGNOSIS — M542 Cervicalgia: Secondary | ICD-10-CM | POA: Diagnosis not present

## 2018-07-31 DIAGNOSIS — G8929 Other chronic pain: Secondary | ICD-10-CM

## 2018-07-31 DIAGNOSIS — J309 Allergic rhinitis, unspecified: Secondary | ICD-10-CM | POA: Diagnosis not present

## 2018-07-31 DIAGNOSIS — L989 Disorder of the skin and subcutaneous tissue, unspecified: Secondary | ICD-10-CM

## 2018-07-31 MED ORDER — TRIAMCINOLONE ACETONIDE 55 MCG/ACT NA AERO: 2.0000 | INHALATION_SPRAY | Freq: Every day | NASAL | 12 refills | Status: DC

## 2018-07-31 MED ORDER — CETIRIZINE-PSEUDOEPHEDRINE ER 5-120 MG PO TB12: 1.0000 | ORAL_TABLET | Freq: Two times a day (BID) | ORAL | 2 refills | Status: DC

## 2018-07-31 MED ORDER — CYCLOBENZAPRINE HCL 10 MG PO TABS: 10.0000 mg | ORAL_TABLET | Freq: Every day | ORAL | 0 refills | Status: DC

## 2018-07-31 MED ORDER — CETIRIZINE-PSEUDOEPHEDRINE ER 5-120 MG PO TB12: 1.0000 | ORAL_TABLET | Freq: Every day | ORAL | 3 refills | Status: DC | PRN

## 2018-07-31 NOTE — Progress Notes (Signed)
Christine Hawkins 63 y.o.   Chief Complaint  Patient presents with  . Neck Pain    several months and had surgery on it in the past. Still hurt today. starts on left arm     HISTORY OF PRESENT ILLNESS: This is a 63 y.o. female complaining of several things: 1.  Chronic allergies with nasal congestion. 2.  Chronic neck pain, status post surgery 2013, did follow-up with neurosurgeon recently, had x-rays done, told she may need MRI.  Surgical plates made of titanium.  Pain at times is a 9 in a 1-10 scale. 3.  Right facial skin lesions, requesting dermatology referral. 4.  Needs to schedule colonoscopy for follow-up on polyps.  HPI   Prior to Admission medications   Medication Sig Start Date End Date Taking? Authorizing Provider  aspirin EC 81 MG tablet Take 81 mg by mouth daily.   Yes [provider]  cetirizine (ZYRTEC) 10 MG tablet TAKE 1 TABLET BY MOUTH EVERY DAY 06/25/18  Yes Opie Maclaughlin, Ines Bloomer, MD  Omega-3 Fatty Acids (OMEGA 3 PO) Take by mouth daily.   Yes [provider]  PARoxetine (PAXIL) 20 MG tablet TAKE 2 TABLETS BY MOUTH EVERY DAY 07/23/18  Yes Landy Dunnavant, Ines Bloomer, MD  rosuvastatin (CRESTOR) 20 MG tablet Take 1 tablet (20 mg total) by mouth daily. 11/12/17  Yes Horald Pollen, MD    Allergies  Allergen Reactions  . Lactose Intolerance (Gi)   . Bupropion Nausea Only  . Niaspan [Niacin] Rash    Itching    Patient Active Problem List   Diagnosis Date Noted  . Chronic neck pain 11/07/2017  . History of high cholesterol 11/07/2017  . Lower urinary tract symptoms (LUTS) 11/07/2017  . Osteoarthritis involving multiple joints on both sides of body 12/01/2006  . HYPERLIPIDEMIA 05/12/2006  . COLONIC POLYPS, BENIGN, HX OF 05/12/2006    Past Medical History:  Diagnosis Date  . Depression   . High cholesterol     Past Surgical History:  Procedure Laterality Date  . BREAST LUMPECTOMY    . CERVICAL SPINE SURGERY    . CESAREAN SECTION    . LEFT  HEART CATHETERIZATION WITH CORONARY ANGIOGRAM N/A 06/07/2012   Procedure: LEFT HEART CATHETERIZATION WITH CORONARY ANGIOGRAM;  Surgeon: Christine Page, MD;  Location: [facility] CATH LAB;  Service: Cardiovascular;  Laterality: N/A;    Social History   Socioeconomic History  . Marital status: Legally Separated    Spouse name: Not on file  . Number of children: 2  . Years of education: Not on file  . Highest education level: Not on file  Occupational History  . Not on file  Social Needs  . Financial resource strain: Not on file  . Food insecurity    Worry: Not on file    Inability: Not on file  . Transportation needs    Medical: Not on file    Non-medical: Not on file  Tobacco Use  . Smoking status: Never Smoker  . Smokeless tobacco: Never Used  Substance and Sexual Activity  . Alcohol use: Not Currently  . Drug use: Not Currently  . Sexual activity: Not Currently  Lifestyle  . Physical activity    Days per week: Not on file    Minutes per session: Not on file  . Stress: Not on file  Relationships  . Social Herbalist on phone: Not on file    Gets together: Not on file    Attends religious service:  Not on file    Active member of club or organization: Not on file    Attends meetings of clubs or organizations: Not on file    Relationship status: Not on file  . Intimate partner violence    Fear of current or ex partner: Not on file    Emotionally abused: Not on file    Physically abused: Not on file    Forced sexual activity: Not on file  Other Topics Concern  . Not on file  Social History Narrative  . Not on file    No family history on file.   Review of Systems  Constitutional: Negative.  Negative for chills and fever.  HENT: Positive for congestion. Negative for sore throat.   Eyes: Negative for blurred vision and double vision.  Respiratory: Negative.  Negative for cough and shortness of breath.   Cardiovascular: Negative.  Negative for chest pain and  palpitations.  Gastrointestinal: Negative for abdominal pain, diarrhea, nausea and vomiting.  Genitourinary: Negative.   Musculoskeletal: Positive for neck pain.  Skin:       Skin lesions  Neurological: Negative for dizziness, sensory change, focal weakness and headaches.  All other systems reviewed and are negative.  Vitals:   07/31/18 1130  BP: (!) 146/72  Pulse: 63  Temp: 98.4 F (36.9 C)  SpO2: 97%     Physical Exam Vitals signs reviewed.  Constitutional:      Appearance: Normal appearance.  HENT:     Head: Normocephalic and atraumatic.     Nose: Congestion present.     Mouth/Throat:     Mouth: Mucous membranes are moist.     Pharynx: Oropharynx is clear.  Eyes:     Extraocular Movements: Extraocular movements intact.     Conjunctiva/sclera: Conjunctivae normal.     Pupils: Pupils are equal, round, and reactive to light.  Neck:     Musculoskeletal: Normal range of motion and neck supple.  Cardiovascular:     Rate and Rhythm: Normal rate and regular rhythm.     Heart sounds: Normal heart sounds.  Pulmonary:     Effort: Pulmonary effort is normal.     Breath sounds: Normal breath sounds.  Abdominal:     Tenderness: There is no abdominal tenderness.  Musculoskeletal: Normal range of motion.  Skin:    General: Skin is warm and dry.     Capillary Refill: Capillary refill takes less than 2 seconds.     Comments: Skin lesions on right cheek  Neurological:     General: No focal deficit present.     Mental Status: She is alert and oriented to person, place, and time.  Psychiatric:        Mood and Affect: Mood normal.        Behavior: Behavior normal.      ASSESSMENT & PLAN: Christine Hawkins was seen today for neck pain.  Diagnoses and all orders for this visit:  Skin lesion of face -     Ambulatory referral to Dermatology  Chronic neck pain -     cyclobenzaprine (FLEXERIL) 10 MG tablet; Take 1 tablet (10 mg total) by mouth at bedtime.  Chronic allergic rhinitis -      triamcinolone (NASACORT) 55 MCG/ACT AERO nasal inhaler; Place 2 sprays into the nose daily. -     Discontinue: cetirizine-pseudoephedrine (ZYRTEC-D ALLERGY & CONGESTION) 5-120 MG tablet; Take 1 tablet by mouth 2 (two) times daily.  Other orders -     cetirizine-pseudoephedrine (ZYRTEC-D ALLERGY & CONGESTION)  5-120 MG tablet; Take 1 tablet by mouth daily as needed for allergies.   A total of 25 minutes was spent in the room with the patient, greater than 50% of which was in counseling/coordination of care regarding differential diagnoses, treatment and medications, side effects, need for follow-up with dermatologist and her neuro surgeon, prognosis, and need for follow-up.   Patient Instructions       If you have lab work done today you will be contacted with your lab results within the next 2 weeks.  If you have not heard from [place] then please contact us. The fastest way to get your results is to register for My Chart.   IF you received an x-ray today, you will receive an invoice from [facility]. Please contact [facility] at [phone] with questions or concerns regarding your invoice.   IF you received labwork today, you will receive an invoice from [facility]. Please contact [facility] at [phone] with questions or concerns regarding your invoice.   Our billing staff will not be able to assist you with questions regarding bills from these companies.  You will be contacted with the lab results as soon as they are available. The fastest way to get your results is to activate your My Chart account. Instructions are located on the last page of this paperwork. If you have not heard from [place] regarding the results in 2 weeks, please contact this office.      Rinitis alrgica en adultos Allergic Rhinitis, Adult La rinitis alrgica es una reaccin a los alrgenos que se encuentran en el aire. Los alrgenos son las partculas minsculas que estn en el aire y que hacen  que el cuerpo tenga una reaccin IT consultant. Esta afeccin no se puede transmitir de [place] persona a otra (no es contagiosa). La rinitis alrgica no se puede curar, pero puede controlarse. [employer] tipos de rinitis alrgica:  Psychologist, counselling. Este tipo tambin se denomina fiebre del heno. Sucede nicamente durante ciertas pocas del ao.  Perenne. Este tipo puede ocurrir en cualquier momento del ao. Cules son las causas? Esta afeccin puede ser causada por lo siguiente:  El polen que proviene de los rboles, el pasto y las [place].  caros del polvo en Engineer, mining.  Caspa de las [employer].  Moho. Cules son los signos o los sntomas? Los sntomas de esta afeccin incluyen:  Estornudos.  Nariz tapada o que gotea (congestin nasal).  Abundante mucosidad en la parte posterior de la garganta (goteo posnasal).  Escozor en la Lawyer.  Lagrimeo.  Dificultad para dormir.  Estar somnoliento Agricultural consultant. Cmo se trata? No hay cura para esta afeccin. Debe evitar las cosas que desencadenan sus sntomas (alrgenos). El tratamiento puede ayudar a [employer] sntomas. Puede incluir:  Medicamentos que [employer] sntomas de la [place], Enhaut antihistamnicos. Estos pueden administrarse en forma de inyeccin, aerosol nasal o comprimidos.  Vacunas que se administran hasta que el cuerpo se vuelve menos sensible al alrgeno (desensibilizacin).  Medicamentos ms potentes, si todos los dems tratamientos no han sido eficaces. Siga estas indicaciones en su casa: Evite los alrgenos   Conozca a qu es Air cabin crew. Los alrgenos comunes incluyen el humo, polvo y [place].  Evtelos si puede. Hay algunas medidas que puede tomar para evitar los alrgenos: ? [employer] alfombras por pisos de [place], baldosas o vinilo. Las alfombras pueden retener la caspa de los animales y [place]. ? Limpie cualquier moho que encuentre en la casa. ? No fume. No permita que  fumen en su casa. ? Cambie el filtro  de la calefaccin y del aire acondicionado al menos una vez al mes. ? Durante la temporada de alergias:  Mantenga las ventanas cerradas todo el tiempo posible. Si es posible, use aire acondicionado cuando hay mucho polen en el aire.  Use un filtro especial para alergias con la caldera y el aire acondicionado.  Planee actividades al aire libre cuando las concentraciones de polen estn en su nivel ms bajo. Normalmente, esto es por la maana temprano o durante las horas de la noche.  Si sale al aire libre cuando la concentracin de polen es [place], use una mscara especial para personas con Set designer.  Cuando vuelva al interior, dese una ducha y [place] de ropa antes de sentarse en los muebles o en la cama. Indicaciones generales  No use ventiladores en su hogar.  No cuelgue ropa en el exterior para que se seque.  Use gafas para el sol para mantener el polen alejado de los ojos.  Lvese las manos enseguida despus de tocar a las [employer].  Tome los medicamentos de venta libre y los recetados solamente como se lo haya indicado el mdico.  Consulting civil engineer a todas las visitas de control como se lo haya indicado el mdico. Esto es importante. Comunquese con un mdico si:  Tiene fiebre.  Tiene tos que no desaparece (es persistente).  Comienza a emitir un sonido agudo al respirar (sibilancias).  Los sntomas no mejoran con Dispensing optician.  Le sale lquido espeso por la Lawyer.  Comienza a tener hemorragia nasal. Solicite ayuda inmediatamente si:  Tiene la boca o los labios hinchados.  Tiene dificultad para respirar.  Se siente mareado o como si se fuera a desmayar.  Tiene transpiracin fra. Resumen  La rinitis alrgica es una reaccin a los alrgenos que se encuentran en el aire.  Esta afeccin puede ser causada por alrgenos. Estos [employer], los caros del polvo, la caspa de las mascotas y Building services engineer.  Los sntomas son goteo y picazn nasal, estornudos o  Industrial/product designer. Tambin es posible que tenga dificultad para dormir o que sienta sueo Agricultural consultant.  El tratamiento incluye tomar medicamentos y Management consultant. Tambin es posible que deba recibir vacunas o tomar medicamentos ms potentes.  Solicite ayuda si tiene fiebre o tos que no se detiene. Solicite ayuda de inmediato si le falta el aire. Esta informacin no tiene Marine scientist el consejo del mdico. Asegrese de hacerle al mdico cualquier pregunta que tenga. Document Released: 06/28/2010 Document Revised: 09/27/2017 Document Reviewed: 09/27/2017 Elsevier Interactive Patient Education  2019 Elsevier Inc.     Agustina Caroli, MD [facility]

## 2018-07-31 NOTE — Patient Instructions (Addendum)
If you have lab work done today you will be contacted with your lab results within the next 2 weeks.  If you have not heard from [place] then please contact us. The fastest way to get your results is to register for My Chart.   IF you received an x-ray today, you will receive an invoice from [facility]. Please contact [facility] at [phone] with questions or concerns regarding your invoice.   IF you received labwork today, you will receive an invoice from [place]. Please contact [facility] at [phone] with questions or concerns regarding your invoice.   Our billing staff will not be able to assist you with questions regarding bills from these companies.  You will be contacted with the lab results as soon as they are available. The fastest way to get your results is to activate your My Chart account. Instructions are located on the last page of this paperwork. If you have not heard from [place] regarding the results in 2 weeks, please contact this office.      Rinitis alrgica en adultos Allergic Rhinitis, Adult La rinitis alrgica es una reaccin a los alrgenos que se encuentran en el aire. Los alrgenos son las partculas minsculas que estn en el aire y que hacen que el cuerpo tenga una reaccin IT consultant. Esta afeccin no se puede transmitir de [place] persona a otra (no es contagiosa). La rinitis alrgica no se puede curar, pero puede controlarse. [employer] tipos de rinitis alrgica:  Psychologist, counselling. Este tipo tambin se denomina fiebre del heno. Sucede nicamente durante ciertas pocas del ao.  Perenne. Este tipo puede ocurrir en cualquier momento del ao. Cules son las causas? Esta afeccin puede ser causada por lo siguiente:  El polen que proviene de los rboles, el pasto y las [place].  caros del polvo en Engineer, mining.  Caspa de las [employer].  Moho. Cules son los signos o los sntomas? Los sntomas de esta afeccin incluyen:  Estornudos.  Nariz  tapada o que gotea (congestin nasal).  Abundante mucosidad en la parte posterior de la garganta (goteo posnasal).  Escozor en la Lawyer.  Lagrimeo.  Dificultad para dormir.  Estar somnoliento Agricultural consultant. Cmo se trata? No hay cura para esta afeccin. Debe evitar las cosas que desencadenan sus sntomas (alrgenos). El tratamiento puede ayudar a [employer] sntomas. Puede incluir:  Medicamentos que [employer] Pont sntomas de la [place], [place] antihistamnicos. Estos pueden administrarse en forma de inyeccin, aerosol nasal o comprimidos.  Vacunas que se administran hasta que el cuerpo se vuelve menos sensible al alrgeno (desensibilizacin).  Medicamentos ms potentes, si todos los dems tratamientos no han sido eficaces. Siga estas indicaciones en su casa: Evite los alrgenos   Conozca a qu es Air cabin crew. Los alrgenos comunes incluyen el humo, polvo y [place].  Evtelos si puede. Hay algunas medidas que puede tomar para evitar los alrgenos: ? [employer] alfombras por pisos de [place], baldosas o vinilo. Las alfombras pueden retener la caspa de los animales y [place]. ? Limpie cualquier moho que encuentre en la casa. ? No fume. No permita que fumen en su casa. ? Cambie el filtro de la calefaccin y del aire acondicionado al menos una vez al mes. ? Durante la temporada de alergias:  Mantenga las ventanas cerradas todo el tiempo posible. Si es posible, use aire acondicionado cuando hay mucho polen en el aire.  Use un filtro especial para alergias con la caldera y el aire acondicionado.  Planee actividades al  aire libre cuando las concentraciones de polen estn en su nivel ms bajo. Normalmente, esto es por la maana temprano o durante las horas de la noche.  Si sale al aire libre cuando la concentracin de polen es [place], use una mscara especial para personas con Set designer.  Cuando vuelva al interior, dese una ducha y [place] de ropa antes de sentarse en los muebles o  en la cama. Indicaciones generales  No use ventiladores en su hogar.  No cuelgue ropa en el exterior para que se seque.  Use gafas para el sol para mantener el polen alejado de los ojos.  Lvese las manos enseguida despus de tocar a las [employer].  Tome los medicamentos de venta libre y los recetados solamente como se lo haya indicado el mdico.  Consulting civil engineer a todas las visitas de control como se lo haya indicado el mdico. Esto es importante. Comunquese con un mdico si:  Tiene fiebre.  Tiene tos que no desaparece (es persistente).  Comienza a emitir un sonido agudo al respirar (sibilancias).  Los sntomas no mejoran con Dispensing optician.  Le sale lquido espeso por la Lawyer.  Comienza a tener hemorragia nasal. Solicite ayuda inmediatamente si:  Tiene la boca o los labios hinchados.  Tiene dificultad para respirar.  Se siente mareado o como si se fuera a desmayar.  Tiene transpiracin fra. Resumen  La rinitis alrgica es una reaccin a los alrgenos que se encuentran en el aire.  Esta afeccin puede ser causada por alrgenos. Estos [employer], los caros del polvo, la caspa de las mascotas y Building services engineer.  Los sntomas son goteo y picazn nasal, estornudos o Industrial/product designer. Tambin es posible que tenga dificultad para dormir o que sienta sueo Agricultural consultant.  El tratamiento incluye tomar medicamentos y Management consultant. Tambin es posible que deba recibir vacunas o tomar medicamentos ms potentes.  Solicite ayuda si tiene fiebre o tos que no se detiene. Solicite ayuda de inmediato si le falta el aire. Esta informacin no tiene Marine scientist el consejo del mdico. Asegrese de hacerle al mdico cualquier pregunta que tenga. Document Released: 06/28/2010 Document Revised: 09/27/2017 Document Reviewed: 09/27/2017 Elsevier Interactive Patient Education  2019 [employer].

## 2018-08-06 ENCOUNTER — Other Ambulatory Visit: Payer: Self-pay

## 2018-08-06 ENCOUNTER — Telehealth: Payer: Self-pay | Admitting: Emergency Medicine

## 2018-08-06 DIAGNOSIS — Z8639 Personal history of other endocrine, nutritional and metabolic disease: Secondary | ICD-10-CM

## 2018-08-06 NOTE — Telephone Encounter (Signed)
Copied from [place] [id number]. Topic: General - Call Back - No Documentation >> Aug 06, 2018  3:19 PM Erick Blinks wrote: Reason for CRM: Pt's son requesting lab orders to check cholesterol. Please advise  Best Contact: [phone]  Louie Casa

## 2018-08-06 NOTE — Telephone Encounter (Signed)
Orders has been placed.

## 2018-10-29 ENCOUNTER — Ambulatory Visit (INDEPENDENT_AMBULATORY_CARE_PROVIDER_SITE_OTHER): Payer: Medicare Other | Admitting: Emergency Medicine

## 2018-10-29 ENCOUNTER — Other Ambulatory Visit: Payer: Self-pay

## 2018-10-29 ENCOUNTER — Encounter: Payer: Self-pay | Admitting: Emergency Medicine

## 2018-10-29 VITALS — BP 128/78 | HR 58 | Temp 98.6°F | Resp 16 | Wt 171.0 lb

## 2018-10-29 DIAGNOSIS — Z23 Encounter for immunization: Secondary | ICD-10-CM

## 2018-10-29 DIAGNOSIS — K5909 Other constipation: Secondary | ICD-10-CM | POA: Diagnosis not present

## 2018-10-29 DIAGNOSIS — Z1239 Encounter for other screening for malignant neoplasm of breast: Secondary | ICD-10-CM | POA: Diagnosis not present

## 2018-10-29 DIAGNOSIS — R1084 Generalized abdominal pain: Secondary | ICD-10-CM | POA: Diagnosis not present

## 2018-10-29 DIAGNOSIS — E785 Hyperlipidemia, unspecified: Secondary | ICD-10-CM

## 2018-10-29 MED ORDER — SENNOSIDES-DOCUSATE SODIUM 8.6-50 MG PO TABS: 1.0000 | ORAL_TABLET | Freq: Every day | ORAL | 1 refills | Status: AC

## 2018-10-29 NOTE — Progress Notes (Signed)
Christine Hawkins 63 y.o.   No chief complaint on file. Abdominal pain and constipation  HISTORY OF PRESENT ILLNESS: This is a 63 y.o. female complaining of abdominal pain and constipation for years but worse the last several weeks.  Has history of internal hemorrhoids so occasionally she sees some blood in the stools.  Has a history of colonic polyps, last colonoscopy over 5 years ago.  Still eating and drinking okay.  Denies nausea or vomiting.  Denies fever or flulike symptoms.  Abdominal pain is intermittent and at times sharp, diffuse, no radiation to the back, and associated with constipation.  No new medications but he is presently taking Paxil which can cause constipation.  HPI  Prior to Admission medications   Medication Sig Start Date End Date Taking? Authorizing Provider  aspirin EC 81 MG tablet Take 81 mg by mouth daily.    [provider]  cetirizine (ZYRTEC) 10 MG tablet TAKE 1 TABLET BY MOUTH EVERY DAY 06/25/18   Horald Pollen, MD  cetirizine-pseudoephedrine (ZYRTEC-D ALLERGY & CONGESTION) 5-120 MG tablet Take 1 tablet by mouth daily as needed for allergies. 07/31/18   Horald Pollen, MD  cyclobenzaprine (FLEXERIL) 10 MG tablet Take 1 tablet (10 mg total) by mouth at bedtime. 07/31/18   Horald Pollen, MD  Omega-3 Fatty Acids (OMEGA 3 PO) Take by mouth daily.    [provider]  PARoxetine (PAXIL) 20 MG tablet TAKE 2 TABLETS BY MOUTH EVERY DAY 07/23/18   Horald Pollen, MD  rosuvastatin (CRESTOR) 20 MG tablet Take 1 tablet (20 mg total) by mouth daily. 11/12/17   Horald Pollen, MD  triamcinolone (NASACORT) 55 MCG/ACT AERO nasal inhaler Place 2 sprays into the nose daily. 07/31/18   Horald Pollen, MD    Allergies  Allergen Reactions  . Lactose Intolerance (Gi)   . Bupropion Nausea Only  . Niaspan [Niacin] Rash    Itching    Patient Active Problem List   Diagnosis Date Noted  . Chronic neck pain 11/07/2017  .  Osteoarthritis involving multiple joints on both sides of body 12/01/2006  . HYPERLIPIDEMIA 05/12/2006  . COLONIC POLYPS, BENIGN, HX OF 05/12/2006    Past Medical History:  Diagnosis Date  . Depression   . High cholesterol     Past Surgical History:  Procedure Laterality Date  . BREAST LUMPECTOMY    . CERVICAL SPINE SURGERY    . CESAREAN SECTION    . LEFT HEART CATHETERIZATION WITH CORONARY ANGIOGRAM N/A 06/07/2012   Procedure: LEFT HEART CATHETERIZATION WITH CORONARY ANGIOGRAM;  Surgeon: Laverda Page, MD;  [facility] C[facility] LAB;  Service: Cardiovascular;  Laterality: N/A;    Social History   Socioeconomic History  . Marital status: Legally Separated    Spouse name: Not on file  . Number of children: 2  . Years of education: Not on file  . Highest education level: Not on file  Occupational History  . Not on file  Social Needs  . Financial resource strain: Not on file  . Food insecurity    Worry: Not on file    Inability: Not on file  . Transportation needs    Medical: Not on file    Non-medical: Not on file  Tobacco Use  . Smoking status: Never Smoker  . Smokeless tobacco: Never Used  Substance and Sexual Activity  . Alcohol use: Not Currently  . Drug use: Not Currently  . Sexual activity: Not Currently  Lifestyle  . Physical  activity    Days per week: Not on file    Minutes per session: Not on file  . Stress: Not on file  Relationships  . Social Herbalist on phone: Not on file    Gets together: Not on file    Attends religious service: Not on file    Active member of club or organization: Not on file    Attends meetings of clubs or organizations: Not on file    Relationship status: Not on file  . Intimate partner violence    Fear of current or ex partner: Not on file    Emotionally abused: Not on file    Physically abused: Not on file    Forced sexual activity: Not on file  Other Topics Concern  . Not on file  Social History Narrative   . Not on file    History reviewed. No pertinent family history.   Review of Systems  Constitutional: Positive for malaise/fatigue. Negative for chills and fever.  HENT: Negative.  Negative for congestion and sore throat.   Respiratory: Negative.  Negative for cough and shortness of breath.   Cardiovascular: Negative.  Negative for chest pain and palpitations.  Gastrointestinal: Positive for abdominal pain and constipation. Negative for blood in stool, diarrhea, melena, nausea and vomiting.  Genitourinary: Negative.   Musculoskeletal: Positive for joint pain.  Skin: Negative.   Neurological: Positive for dizziness. Negative for headaches.  Endo/Heme/Allergies: Negative.   All other systems reviewed and are negative.  Vitals:   10/29/18 1326  BP: 128/78  Pulse: (!) 58  Resp: 16  Temp: 98.6 F (37 C)  SpO2: 98%     Physical Exam Vitals signs reviewed.  Constitutional:      Appearance: She is well-developed.  HENT:     Head: Normocephalic.     Mouth/Throat:     Mouth: Mucous membranes are moist.     Pharynx: Oropharynx is clear.  Eyes:     Extraocular Movements: Extraocular movements intact.     Conjunctiva/sclera: Conjunctivae normal.     Pupils: Pupils are equal, round, and reactive to light.  Neck:     Musculoskeletal: Normal range of motion and neck supple.  Cardiovascular:     Rate and Rhythm: Normal rate and regular rhythm.     Pulses: Normal pulses.     Heart sounds: Normal heart sounds.  Pulmonary:     Effort: Pulmonary effort is normal.     Breath sounds: Normal breath sounds.  Abdominal:     General: Bowel sounds are normal. There is no distension.     Palpations: Abdomen is soft. There is no mass.     Tenderness: There is no abdominal tenderness. There is no guarding.  Musculoskeletal: Normal range of motion.        General: No swelling or tenderness.     Right lower leg: No edema.     Left lower leg: No edema.  Skin:    General: Skin is warm and  dry.     Capillary Refill: Capillary refill takes less than 2 seconds.  Neurological:     General: No focal deficit present.     Mental Status: She is alert and oriented to person, place, and time.  Psychiatric:        Mood and Affect: Mood normal.        Behavior: Behavior normal.     A total of 25 minutes was spent in the room with the patient, greater  than 50% of which was in counseling/coordination of care regarding differential diagnosis of abdominal pain and constipation, treatment, medications and side effects, diet and nutrition in specific a high-fiber diet, need for blood work and follow-up with gastrointestinal doctor for colonoscopy, prognosis, and need for follow-up.   ASSESSMENT & PLAN: Christine Hawkins was seen today for abdominal pain and dizziness.  Diagnoses and all orders for this visit:  Generalized abdominal pain -     Ambulatory referral to Gastroenterology -     CBC with Differential/Platelet -     Comprehensive metabolic panel  Need for prophylactic vaccination and inoculation against influenza -     Flu Vaccine QUAD High Dose(Fluad)  Need for vaccination against Streptococcus pneumoniae using pneumococcal conjugate vaccine 13 -     Pneumococcal conjugate vaccine 13-valent IM  Chronic constipation -     Ambulatory referral to Gastroenterology -     senna-docusate (SENOKOT-S) 8.6-50 MG tablet; Take 1 tablet by mouth daily for 7 days.  Breast cancer screening  Dyslipidemia -     Lipid panel    Patient Instructions       If you have lab work done today you will be contacted with your lab results within the next 2 weeks.  If you have not heard from [place] then please contact us. The fastest way to get your results is to register for My Chart.   IF you received an x-ray today, you will receive an invoice from [facility]. Please contact [facility]iology at [phone] with questions or concerns regarding your invoice.   IF you received labwork  today, you will receive an invoice from [place]. Please contact [facility] at [phone] with questions or concerns regarding your invoice.   Our billing staff will not be able to assist you with questions regarding bills from these companies.  You will be contacted with the lab results as soon as they are available. The fastest way to get your results is to activate your My Chart account. Instructions are located on the last page of this paperwork. If you have not heard from [place] regarding the results in 2 weeks, please contact this office.     Estreimiento en los adultos Constipation, Adult Se llama estreimiento cuando:  Tiene deposiciones (defeca) una menor cantidad de veces a la semana de lo normal.  Tiene dificultad para defecar.  Las heces son secas y duras o son ms grandes que lo normal. Siga estas indicaciones en su casa: Comida y bebida   Consuma alimentos con alto contenido de [place], por ejemplo: ? Lambert Mody y verduras frescas. ? Cereales integrales. ? Frijoles.  Consuma una menor cantidad de alimentos ricos en grasas, con bajo contenido de [place] o excesivamente procesados, como: ? Papas fritas. ? Hamburguesas. ? Galletas. ? Caramelos. ? Gaseosas.  Beba suficiente lquido para mantener el pis (orina) claro o de color amarillo plido. Instrucciones generales  Haga actividad fsica con regularidad o segn las indicaciones del mdico.  Vaya al bao cuando sienta la necesidad de defecar. No se aguante las ganas.  Tome los medicamentos de venta libre y los recetados solamente como se lo haya indicado el mdico. Estos incluyen los suplementos de [place].  Realice ejercicios de reentrenamiento del suelo plvico, como: ? Respirar profundamente mientras relaja la parte inferior del vientre (abdomen). ? Relajar el suelo plvico mientras defeca.  Controle su afeccin para ver si hay cambios.  Concurra a todas las visitas de control como se lo haya indicado el mdico. Esto  es importante.  Comunquese con un mdico si:  Siente un dolor que empeora.  Tiene fiebre.  No ha defecado por 4das.  Vomita.  No tiene hambre.  Pierde peso.  Tiene una hemorragia en el ano.  Las deposiciones Media planner) son delgadas como un lpiz. Solicite ayuda de inmediato si:  Jaclynn Guarneri, y los sntomas empeoran de repente.  Tiene prdida de materia fecal u observa [employer].  Siente el vientre ms duro o ms grande de lo normal (est hinchado).  Siente un dolor muy intenso en el vientre.  Se siente mareado o se desmaya. Esta informacin no tiene Marine scientist el consejo del mdico. Asegrese de hacerle al mdico cualquier pregunta que tenga. Document Released: 02/26/2010 Document Revised: 04/27/2016 Document Reviewed: 07/15/2015 Elsevier Patient Education  2020 [place] abdominal en los adultos Abdominal Pain, Adult  El dolor de [place] (abdominal) puede tener muchas causas. Heber veces, el dolor de [place] no es peligroso. Muchos de [employer] de dolor de estmago pueden controlarse y tratarse en casa. Sin embargo, a Clinical cytogeneticist, Conservation officer, historic buildings de [employer] puede ser grave. El mdico intentar descubrir la causa del dolor de [place]. Siga estas indicaciones en su casa:  Tome los medicamentos de venta libre y los recetados solamente como se lo haya indicado el mdico. No tome medicamentos que lo ayuden a Landscape architect (laxantes), salvo que el mdico se lo indique.  Beba suficiente lquido para mantener el pis (orina) claro o de color amarillo plido.  Est atento al dolor de estmago para Actuary cambio.  Concurra a todas las visitas de control como se lo haya indicado el mdico. Esto es importante. Comunquese con un mdico si:  El dolor de estmago cambia o [place].  No tiene apetito o baja de peso sin proponrselo.  Tiene dificultades para defecar (est estreido) o heces lquidas (diarrea) durante ms de 2 o 3das.   Siente dolor al orinar o defecar.  El dolor de estmago lo despierta de noche.  El dolor empeora con las comidas, despus de comer o con determinados alimentos.  Vomita y no puede retener nada de lo que ingiere.  Tiene fiebre. Solicite ayuda de inmediato si:  El dolor no desaparece en el tiempo indicado por el mdico.  No puede detener los vmitos.  Siente dolor solamente en zonas especficas del abdomen, como el lado derecho o la parte inferior izquierda.  Tiene heces con sangre, de color negro o con aspecto alquitranado.  Tiene dolor muy intenso en el vientre, clicos o meteorismo.  Presenta signos de no tener suficientes lquidos o agua en el cuerpo (deshidratacin), por ejemplo: ? [place] es [place], es muy escasa o no orina. ? Labios agrietados. ? [employer]. ? Ojos hundidos. ? Somnolencia. ? Debilidad. Esta informacin no tiene Marine scientist el consejo del mdico. Asegrese de hacerle al mdico cualquier pregunta que tenga. Document Released: 04/22/2008 Document Revised: 04/20/2016 Document Reviewed: 07/08/2015 Elsevier Interactive Patient Education  2020 Elsevier Inc.      Agustina Caroli, MD [facility]

## 2018-10-29 NOTE — Patient Instructions (Addendum)
If you have lab work done today you will be contacted with your lab results within the next 2 weeks.  If you have not heard from [place] then please contact us. The fastest way to get your results is to register for My Chart.   IF you received an x-ray today, you will receive an invoice from [facility]. Please contact [facility] at [phone] with questions or concerns regarding your invoice.   IF you received labwork today, you will receive an invoice from [facility]. Please contact [facility] at [phone] with questions or concerns regarding your invoice.   Our billing staff will not be able to assist you with questions regarding bills from these companies.  You will be contacted with the lab results as soon as they are available. The fastest way to get your results is to activate your My Chart account. Instructions are located on the last page of this paperwork. If you have not heard from [place] regarding the results in 2 weeks, please contact this office.     Estreimiento en los adultos Constipation, Adult Se llama estreimiento cuando:  Tiene deposiciones (defeca) una menor cantidad de veces a la semana de lo normal.  Tiene dificultad para defecar.  Las heces son secas y duras o son ms grandes que lo normal. Siga estas indicaciones en su casa: Comida y bebida   Consuma alimentos con alto contenido de [place], por ejemplo: ? Lambert Mody y verduras frescas. ? Cereales integrales. ? Frijoles.  Consuma una menor cantidad de alimentos ricos en grasas, con bajo contenido de [place] o excesivamente procesados, como: ? Papas fritas. ? Hamburguesas. ? Galletas. ? Caramelos. ? Gaseosas.  Beba suficiente lquido para mantener el pis (orina) claro o de color amarillo plido. Instrucciones generales  Haga actividad fsica con regularidad o segn las indicaciones del mdico.  Vaya al bao cuando sienta la necesidad de defecar. No se aguante las ganas.  Tome los  medicamentos de venta libre y los recetados solamente como se lo haya indicado el mdico. Estos incluyen los suplementos de [place].  Realice ejercicios de reentrenamiento del suelo plvico, como: ? Respirar profundamente mientras relaja la parte inferior del vientre (abdomen). ? Relajar el suelo plvico mientras defeca.  Controle su afeccin para ver si hay cambios.  Concurra a todas las visitas de control como se lo haya indicado el mdico. Esto es importante. Comunquese con un mdico si:  Siente un dolor que empeora.  Tiene fiebre.  No ha defecado por 4das.  Vomita.  No tiene hambre.  Pierde peso.  Tiene una hemorragia en el ano.  Las deposiciones Media planner) son delgadas como un lpiz. Solicite ayuda de inmediato si:  Jaclynn Guarneri, y los sntomas empeoran de repente.  Tiene prdida de materia fecal u observa [employer].  Siente el vientre ms duro o ms grande de lo normal (est hinchado).  Siente un dolor muy intenso en el vientre.  Se siente mareado o se desmaya. Esta informacin no tiene Marine scientist el consejo del mdico. Asegrese de hacerle al mdico cualquier pregunta que tenga. Document Released: 02/26/2010 Document Revised: 04/27/2016 Document Reviewed: 07/15/2015 Elsevier Patient Education  2020 [place] abdominal en los adultos Abdominal Pain, Adult  El dolor de [place] (abdominal) puede tener muchas causas. Davis veces, el dolor de [place] no es peligroso. Muchos de [employer] de dolor de estmago pueden controlarse y tratarse en casa. Sin embargo, a Clinical cytogeneticist, Conservation officer, historic buildings de estmago puede  ser grave. El mdico intentar descubrir la causa del dolor de [place]. Siga estas indicaciones en su casa:  Tome los medicamentos de venta libre y los recetados solamente como se lo haya indicado el mdico. No tome medicamentos que lo ayuden a Landscape architect (laxantes), salvo que el mdico se lo indique.  Beba suficiente lquido para  mantener el pis (orina) claro o de color amarillo plido.  Est atento al dolor de estmago para Actuary cambio.  Concurra a todas las visitas de control como se lo haya indicado el mdico. Esto es importante. Comunquese con un mdico si:  El dolor de estmago cambia o [place].  No tiene apetito o baja de peso sin proponrselo.  Tiene dificultades para defecar (est estreido) o heces lquidas (diarrea) durante ms de 2 o 3das.  Siente dolor al orinar o defecar.  El dolor de estmago lo despierta de noche.  El dolor empeora con las comidas, despus de comer o con determinados alimentos.  Vomita y no puede retener nada de lo que ingiere.  Tiene fiebre. Solicite ayuda de inmediato si:  El dolor no desaparece en el tiempo indicado por el mdico.  No puede detener los vmitos.  Siente dolor solamente en zonas especficas del abdomen, como el lado derecho o la parte inferior izquierda.  Tiene heces con sangre, de color negro o con aspecto alquitranado.  Tiene dolor muy intenso en el vientre, clicos o meteorismo.  Presenta signos de no tener suficientes lquidos o agua en el cuerpo (deshidratacin), por ejemplo: ? [place] es [place], es muy escasa o no orina. ? Labios agrietados. ? [employer]. ? Ojos hundidos. ? Somnolencia. ? Debilidad. Esta informacin no tiene Marine scientist el consejo del mdico. Asegrese de hacerle al mdico cualquier pregunta que tenga. Document Released: 04/22/2008 Document Revised: 04/20/2016 Document Reviewed: 07/08/2015 Elsevier Interactive Patient Education  [employer].

## 2018-10-30 LAB — CBC WITH DIFFERENTIAL/PLATELET
Basophils Absolute: 0 10*3/uL (ref 0.0–0.2)
Basos: 1 %
EOS (ABSOLUTE): 0.2 10*3/uL (ref 0.0–0.4)
Eos: 4 %
Hematocrit: 40.1 % (ref 34.0–46.6)
Hemoglobin: 12.7 g/dL (ref 11.1–15.9)
Immature Grans (Abs): 0 10*3/uL (ref 0.0–0.1)
Immature Granulocytes: 0 %
Lymphocytes Absolute: 2.3 10*3/uL (ref 0.7–3.1)
Lymphs: 40 %
MCH: 27.2 pg (ref 26.6–33.0)
MCHC: 31.7 g/dL (ref 31.5–35.7)
MCV: 86 fL (ref 79–97)
Monocytes Absolute: 0.6 10*3/uL (ref 0.1–0.9)
Monocytes: 10 %
Neutrophils Absolute: 2.6 10*3/uL (ref 1.4–7.0)
Neutrophils: 45 %
Platelets: 185 10*3/uL (ref 150–450)
RBC: 4.67 x10E6/uL (ref 3.77–5.28)
RDW: 12.9 % (ref 11.7–15.4)
WBC: 5.8 10*3/uL (ref 3.4–10.8)

## 2018-10-30 LAB — COMPREHENSIVE METABOLIC PANEL
ALT: 20 IU/L (ref 0–32)
AST: 18 IU/L (ref 0–40)
Albumin/Globulin Ratio: 2 (ref 1.2–2.2)
Albumin: 4.3 g/dL (ref 3.8–4.8)
Alkaline Phosphatase: 63 IU/L (ref 39–117)
BUN/Creatinine Ratio: 13 (ref 12–28)
BUN: 10 mg/dL (ref 8–27)
Bilirubin Total: 0.4 mg/dL (ref 0.0–1.2)
CO2: 27 mmol/L (ref 20–29)
Calcium: 9.3 mg/dL (ref 8.7–10.3)
Chloride: 101 mmol/L (ref 96–106)
Creatinine, Ser: 0.78 mg/dL (ref 0.57–1.00)
GFR calc Af Amer: 92 mL/min/{1.73_m2} (ref >59)
GFR calc non Af Amer: 80 mL/min/{1.73_m2} (ref >59)
Globulin, Total: 2.2 g/dL (ref 1.5–4.5)
Glucose: 94 mg/dL (ref 65–99)
Potassium: 4.1 mmol/L (ref 3.5–5.2)
Sodium: 140 mmol/L (ref 134–144)
Total Protein: 6.5 g/dL (ref 6.0–8.5)

## 2018-10-30 LAB — LIPID PANEL
Chol/HDL Ratio: 3.2 ratio (ref 0.0–4.4)
Cholesterol, Total: 186 mg/dL (ref 100–199)
HDL: 58 mg/dL (ref >39)
LDL Chol Calc (NIH): 115 mg/dL — ABNORMAL HIGH (ref 0–99)
Triglycerides: 70 mg/dL (ref 0–149)
VLDL Cholesterol Cal: 13 mg/dL (ref 5–40)

## 2018-10-31 ENCOUNTER — Encounter: Payer: Self-pay | Admitting: Gastroenterology

## 2018-12-01 ENCOUNTER — Other Ambulatory Visit: Payer: Self-pay | Admitting: Emergency Medicine

## 2018-12-01 DIAGNOSIS — E785 Hyperlipidemia, unspecified: Secondary | ICD-10-CM

## 2018-12-01 NOTE — Telephone Encounter (Signed)
Forwarding medication refill request to the clinical pool for review. 

## 2018-12-04 ENCOUNTER — Encounter: Payer: Self-pay | Admitting: Gastroenterology

## 2018-12-04 ENCOUNTER — Other Ambulatory Visit: Payer: Self-pay

## 2018-12-04 ENCOUNTER — Ambulatory Visit (INDEPENDENT_AMBULATORY_CARE_PROVIDER_SITE_OTHER): Payer: Medicare Other | Admitting: Gastroenterology

## 2018-12-04 VITALS — BP 130/70 | HR 68 | Temp 98.6°F | Ht 62.75 in | Wt 171.0 lb

## 2018-12-04 DIAGNOSIS — K5909 Other constipation: Secondary | ICD-10-CM | POA: Diagnosis not present

## 2018-12-04 DIAGNOSIS — R1030 Lower abdominal pain, unspecified: Secondary | ICD-10-CM

## 2018-12-04 MED ORDER — MOTEGRITY 2 MG PO TABS: 1.0000 | ORAL_TABLET | ORAL | 0 refills | Status: DC

## 2018-12-04 NOTE — Patient Instructions (Signed)
Take Miralax twice daily for 3 days (samples provided).   Then start the samples of Motegrity 2mg  , take one capsule every other day.   Dr Loletha Carrow will see you back on 01/17/2019.   I appreciate the opportunity to care for you. Wilfrid Lund, MD

## 2018-12-04 NOTE — Progress Notes (Signed)
[facility] Consult Note:  History: Christine Hawkins 12/04/2018  Referring provider: Horald Pollen, MD  Reason for consult/chief complaint: Constipation (onset x months, causes abd pain , blood with wiping)   Subjective  HPI: Recent primary care note indicating weeks of worsening chronic generalized abdominal pain and constipation.  Some suspicion for Paxil being related to bowel habit change.  Indicates prior colonoscopy at least 5 years ago, no report in epic system.  From 07/11/17 [facility] office note (Dr. Gus Puma PA): "Christine Hawkins is a 63 y.o. Other female who presents today for constipation. Her chart reviewed and followed by Lake Bells, MD ; last seen in 11/2014 for a colonoscopy due to a personal history of polyps that was negative. Due for a surveillance colonoscopy in 11/2019.   A video interpreter is used to obtain the history as she speaks [place]. She's been dealing with constipation since 2013. She typically only has a BM every 2-3 days. She has associated lower abdominal discomfort that is relieved after a BM. She started taking Miralax every couple of days in April and it has helped some, but she still has some days without a BM. She had one episode of rectal bleeding in April after straining. TSH was normal in November, Calcium normal in April. No family history of colon cancer. Her dad had colon polyps." That provider recommended continued miralax.   [name] was seen with Spanish interpreter Willette Pa, who was present for the entire encounter). She reports years of chronic constipation and episodic lower abdominal pain, both of which seem to be worse in the last several months.  She has occasionally seen blood with wiping but no frank rectal bleeding.  It does not sound like she took MiraLAX daily as recommended, or least not in recent months.  As near as I can tell, when she did take it regularly, it made the stools soft but she still had to  strain and felt as if the stool would not pass well.  She did not feel anything protruding, did not engage in measures such as manual disimpaction or perineal pressure to aid with bowel movements.   ROS:  Review of Systems  Constitutional: Negative for appetite change and unexpected weight change.  HENT: Negative for mouth sores and voice change.   Eyes: Negative for pain and redness.  Respiratory: Negative for cough and shortness of breath.   Cardiovascular: Negative for chest pain and palpitations.  Genitourinary: Negative for dysuria and hematuria.  Musculoskeletal: Negative for arthralgias and myalgias.  Skin: Negative for pallor and rash.  Neurological: Negative for weakness and headaches.  Hematological: Negative for adenopathy.  Psychiatric/Behavioral:       Depression     Past Medical History: Past Medical History:  Diagnosis Date  . Anxiety   . Colon polyps   . Depression   . GERD (gastroesophageal reflux disease)   . High cholesterol   . Osteopenia      Past Surgical History: Past Surgical History:  Procedure Laterality Date  . BREAST LUMPECTOMY Left   . CERVICAL SPINE SURGERY    . CESAREAN SECTION    . LEFT HEART CATHETERIZATION WITH CORONARY ANGIOGRAM N/A 06/07/2012   Procedure: LEFT HEART CATHETERIZATION WITH CORONARY ANGIOGRAM;  Surgeon: Laverda Page, MD;  Location: [facility] C[facility] LAB;  Service: Cardiovascular;  Laterality: N/A;     Family History: Family History  Problem Relation Age of Onset  . Heart disease Mother   . Heart disease Father   .  Colon polyps Father   . Kidney disease Maternal Grandmother   . Stomach cancer Paternal Grandmother   . Diabetes Paternal Grandmother   . Esophageal cancer Neg Hx   . Colon cancer Neg Hx     Social History: Social History   Socioeconomic History  . Marital status: Legally Separated    Spouse name: Not on file  . Number of children: 2  . Years of education: Not on file  . Highest education level: Not  on file  Occupational History  . Occupation: retired  Scientific laboratory technician  . Financial resource strain: Not on file  . Food insecurity    Worry: Not on file    Inability: Not on file  . Transportation needs    Medical: Not on file    Non-medical: Not on file  Tobacco Use  . Smoking status: Never Smoker  . Smokeless tobacco: Never Used  Substance and Sexual Activity  . Alcohol use: Not Currently  . Drug use: Not Currently  . Sexual activity: Not Currently  Lifestyle  . Physical activity    Days per week: Not on file    Minutes per session: Not on file  . Stress: Not on file  Relationships  . Social Herbalist on phone: Not on file    Gets together: Not on file    Attends religious service: Not on file    Active member of club or organization: Not on file    Attends meetings of clubs or organizations: Not on file    Relationship status: Not on file  Other Topics Concern  . Not on file  Social History Narrative  . Not on file    Allergies: Allergies  Allergen Reactions  . Lactose Intolerance (Gi)   . Bupropion Nausea Only  . Niaspan [Niacin] Rash    Itching    Outpatient Meds: Current Outpatient Medications  Medication Sig Dispense Refill  . aspirin EC 81 MG tablet Take 81 mg by mouth daily.    . cetirizine (ZYRTEC) 10 MG tablet TAKE 1 TABLET BY MOUTH EVERY DAY 90 tablet 0  . cyclobenzaprine (FLEXERIL) 10 MG tablet Take 1 tablet (10 mg total) by mouth at bedtime. 20 tablet 0  . Omega-3 Fatty Acids (OMEGA 3 PO) Take by mouth daily.    Marland Kitchen PARoxetine (PAXIL) 20 MG tablet TAKE 2 TABLETS BY MOUTH EVERY DAY 180 tablet 1  . rosuvastatin (CRESTOR) 20 MG tablet Take 1 tablet (20 mg total) by mouth daily. 90 tablet 3  . triamcinolone (NASACORT) 55 MCG/ACT AERO nasal inhaler Place 2 sprays into the nose daily. 1 Inhaler 12   No current facility-administered medications for this visit.       ___________________________________________________________________  Objective   Exam:  BP 130/70   Pulse 68   Temp 98.6 F (37 C)   Ht 5' 2.75" (1.594 m)   Wt 171 lb (77.6 kg)   BMI 30.53 kg/m    General: Well-appearing  Eyes: sclera anicteric, no redness  ENT: oral mucosa moist without lesions, no cervical or supraclavicular lymphadenopathy  CV: RRR without murmur, S1/S2, no JVD, no peripheral edema  Resp: clear to auscultation bilaterally, normal RR and effort noted  GI: soft, mild RLQ tenderness, with active bowel sounds. No guarding or palpable organomegaly noted.  Skin; warm and dry, no rash or jaundice noted  Neuro: awake, alert and oriented x 3. Normal gross motor function and fluent speech  Labs:  CBC Latest Ref Rng &  Units 10/29/2018 11/09/2017 06/07/2012  WBC 3.4 - 10.8 x10E3/uL 5.8 5.0 7.1  Hemoglobin 11.1 - 15.9 g/dL 12.7 13.0 11.4(L)  Hematocrit 34.0 - 46.6 % 40.1 41.9 35.7(L)  Platelets 150 - 450 x10E3/uL 185 207 187   CMP Latest Ref Rng & Units 10/29/2018 11/09/2017 06/07/2012  Glucose 65 - 99 mg/dL 94 110(H) 103(H)  BUN 8 - 27 mg/dL 10 13 9   Creatinine 0.57 - 1.00 mg/dL 0.78 0.87 0.79  Sodium 134 - 144 mmol/L 140 142 143  Potassium 3.5 - 5.2 mmol/L 4.1 4.4 3.7  Chloride 96 - 106 mmol/L 101 101 105  CO2 20 - 29 mmol/L 27 24 31   Calcium 8.7 - 10.3 mg/dL 9.3 9.7 9.4  Total Protein 6.0 - 8.5 g/dL 6.5 6.7 6.7  Total Bilirubin 0.0 - 1.2 mg/dL 0.4 0.5 0.4  Alkaline Phos 39 - 117 IU/L 63 73 53  AST 0 - 40 IU/L 18 28 18   ALT 0 - 32 IU/L 20 40(H) 17    No TSH on file in epic system (see above, reportedly normal when checked by [facility])    Assessment: Encounter Diagnoses  Name Primary?  . Chronic constipation Yes  . Lower abdominal pain     Chronic symptoms for years, worse last few months, possibly related to starting Paxil. Even when stool soft or "pasty" as she described it while on MiraLAX, still seems to have a motility issue.  As such, Linzess or Amitiza seem less likely to be helpful.  Given the  chronicity, reports of previous colonoscopy at outside practice, she does not appear to need a colonoscopy at this point.  Plan:  MiraLAX 1 packet twice daily for 3 days, then start samples of Motegrity 2 mg, 1 tablet every other day. Follow-up with me in about 6 weeks. If Motegrity not tolerated or does not work well, she may need the Paxil changed.  Thank you for the courtesy of this consult.  Please call me with any questions or concerns.  Nelida Meuse III  CC: Referring provider noted above

## 2018-12-15 LAB — HM MAMMOGRAPHY

## 2018-12-17 ENCOUNTER — Emergency Department (HOSPITAL_BASED_OUTPATIENT_CLINIC_OR_DEPARTMENT_OTHER)
Admission: EM | Admit: 2018-12-17 | Discharge: 2018-12-17 | Disposition: A | Discharge status: Home / Self Care | Payer: Medicare Other | Attending: Emergency Medicine | Admitting: Emergency Medicine

## 2018-12-17 ENCOUNTER — Other Ambulatory Visit: Payer: Self-pay

## 2018-12-17 ENCOUNTER — Emergency Department (HOSPITAL_BASED_OUTPATIENT_CLINIC_OR_DEPARTMENT_OTHER): Payer: Medicare Other

## 2018-12-17 ENCOUNTER — Encounter (HOSPITAL_BASED_OUTPATIENT_CLINIC_OR_DEPARTMENT_OTHER): Payer: Self-pay | Admitting: *Deleted

## 2018-12-17 DIAGNOSIS — Y999 Unspecified external cause status: Secondary | ICD-10-CM | POA: Insufficient documentation

## 2018-12-17 DIAGNOSIS — Z888 Allergy status to other drugs, medicaments and biological substances status: Secondary | ICD-10-CM | POA: Diagnosis not present

## 2018-12-17 DIAGNOSIS — S0990XA Unspecified injury of head, initial encounter: Secondary | ICD-10-CM | POA: Insufficient documentation

## 2018-12-17 DIAGNOSIS — Y92410 Unspecified street and highway as the place of occurrence of the external cause: Secondary | ICD-10-CM | POA: Diagnosis not present

## 2018-12-17 DIAGNOSIS — S199XXA Unspecified injury of neck, initial encounter: Secondary | ICD-10-CM | POA: Diagnosis present

## 2018-12-17 DIAGNOSIS — Z79899 Other long term (current) drug therapy: Secondary | ICD-10-CM | POA: Diagnosis not present

## 2018-12-17 DIAGNOSIS — E782 Mixed hyperlipidemia: Secondary | ICD-10-CM | POA: Diagnosis not present

## 2018-12-17 DIAGNOSIS — Z7982 Long term (current) use of aspirin: Secondary | ICD-10-CM | POA: Insufficient documentation

## 2018-12-17 DIAGNOSIS — S39012A Strain of muscle, fascia and tendon of lower back, initial encounter: Secondary | ICD-10-CM | POA: Insufficient documentation

## 2018-12-17 DIAGNOSIS — Y9389 Activity, other specified: Secondary | ICD-10-CM | POA: Insufficient documentation

## 2018-12-17 DIAGNOSIS — S161XXA Strain of muscle, fascia and tendon at neck level, initial encounter: Secondary | ICD-10-CM | POA: Diagnosis not present

## 2018-12-17 MED ORDER — CYCLOBENZAPRINE HCL 10 MG PO TABS: 10.0000 mg | ORAL_TABLET | Freq: Two times a day (BID) | ORAL | 0 refills | Status: DC | PRN

## 2018-12-17 MED ORDER — ONDANSETRON 4 MG PO TBDP
4.0000 mg | ORAL_TABLET | Freq: Once | ORAL | Status: AC
  Administered 2018-12-17: 20:00:00 4 mg via ORAL
  Filled 2018-12-17: qty 1

## 2018-12-17 MED ORDER — IBUPROFEN 400 MG PO TABS: 400.0000 mg | ORAL_TABLET | Freq: Three times a day (TID) | ORAL | 0 refills | Status: DC | PRN
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
## 2018-12-17 NOTE — Discharge Instructions (Addendum)
Take the medications as needed for pain, follow-up with your primary care doctor if your symptoms do not resolve over the next week.
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
## 2018-12-17 NOTE — ED Notes (Signed)
Pt in radiology 

## 2018-12-24 ENCOUNTER — Encounter: Payer: Self-pay | Admitting: *Deleted

## 2019-01-17 ENCOUNTER — Ambulatory Visit: Payer: Medicare Other | Admitting: Gastroenterology

## 2019-01-22 ENCOUNTER — Other Ambulatory Visit: Payer: Self-pay | Admitting: Emergency Medicine

## 2019-03-01 ENCOUNTER — Other Ambulatory Visit: Payer: Self-pay | Admitting: Emergency Medicine

## 2019-03-01 DIAGNOSIS — E785 Hyperlipidemia, unspecified: Secondary | ICD-10-CM

## 2019-03-01 NOTE — Telephone Encounter (Signed)
Please schedule pt and we will sent curtsy refill.   Med refill/ (Cholesterol) Lipid visit

## 2019-03-01 NOTE — Telephone Encounter (Signed)
Forwarding medication refill request to clinical pool for review. 

## 2019-03-05 NOTE — Telephone Encounter (Signed)
Called pt. And scheduled appt. For 03/18/2019

## 2019-03-18 ENCOUNTER — Ambulatory Visit (INDEPENDENT_AMBULATORY_CARE_PROVIDER_SITE_OTHER): Payer: Medicare Other | Admitting: Emergency Medicine

## 2019-03-18 ENCOUNTER — Other Ambulatory Visit: Payer: Self-pay

## 2019-03-18 ENCOUNTER — Encounter: Payer: Self-pay | Admitting: Emergency Medicine

## 2019-03-18 VITALS — BP 124/70 | HR 59 | Temp 97.7°F | Resp 16 | Ht 62.75 in | Wt 177.0 lb

## 2019-03-18 DIAGNOSIS — M159 Polyosteoarthritis, unspecified: Secondary | ICD-10-CM

## 2019-03-18 DIAGNOSIS — E785 Hyperlipidemia, unspecified: Secondary | ICD-10-CM | POA: Diagnosis not present

## 2019-03-18 DIAGNOSIS — Z1211 Encounter for screening for malignant neoplasm of colon: Secondary | ICD-10-CM | POA: Diagnosis not present

## 2019-03-18 DIAGNOSIS — J309 Allergic rhinitis, unspecified: Secondary | ICD-10-CM | POA: Diagnosis not present

## 2019-03-18 MED ORDER — ROSUVASTATIN CALCIUM 20 MG PO TABS: 20.0000 mg | ORAL_TABLET | Freq: Every day | ORAL | 3 refills | Status: DC

## 2019-03-18 MED ORDER — TRIAMCINOLONE ACETONIDE 55 MCG/ACT NA AERO: 2.0000 | INHALATION_SPRAY | Freq: Every day | NASAL | 12 refills | Status: AC

## 2019-03-18 MED ORDER — MUPIROCIN 2 % EX OINT: TOPICAL_OINTMENT | CUTANEOUS | 1 refills | Status: AC

## 2019-03-18 MED ORDER — CETIRIZINE HCL 10 MG PO TABS: 10.0000 mg | ORAL_TABLET | Freq: Every day | ORAL | 3 refills | Status: DC

## 2019-03-18 MED ORDER — PAROXETINE HCL 20 MG PO TABS: 40.0000 mg | ORAL_TABLET | Freq: Every day | ORAL | 3 refills | Status: DC

## 2019-03-18 NOTE — Patient Instructions (Signed)

## 2019-03-18 NOTE — Progress Notes (Signed)
Christine Hawkins 64 y.o.   Chief Complaint  Patient presents with  . Medication Refill    SEE PEND RXs    HISTORY OF PRESENT ILLNESS: This is a 64 y.o. female with chronic medical problems here for follow-up and medication refill. Has history of dyslipidemia and osteoarthritis. Doing well.  Has no complaints or medical concerns today. Health maintenance items reviewed with patient.  HPI   Prior to Admission medications   Medication Sig Start Date End Date Taking? Authorizing Provider  aspirin EC 81 MG tablet Take 81 mg by mouth daily.   Yes [provider]  cetirizine (ZYRTEC) 10 MG tablet Take 1 tablet (10 mg total) by mouth daily. 03/18/19  Yes Lilas Diefendorf, Ines Bloomer, MD  Cholecalciferol (VITAMIN D3) 125 MCG (5000 UT) CAPS Take by mouth daily.   Yes [provider]  [facility] COENZYME Q10 PO Take 100 mg by mouth.   Yes [provider]  cyclobenzaprine (FLEXERIL) 10 MG tablet Take 1 tablet (10 mg total) by mouth 2 (two) times daily as needed for muscle spasms. 12/17/18  Yes Dorie Rank, MD  ibuprofen (ADVIL) 400 MG tablet Take 1 tablet (400 mg total) by mouth every 8 (eight) hours as needed. 12/17/18  Yes Dorie Rank, MD  Omega-3 Fatty Acids (OMEGA 3 PO) Take by mouth daily.   Yes [provider]  PARoxetine (PAXIL) 20 MG tablet Take 2 tablets (40 mg total) by mouth daily. 03/18/19 06/16/19 Yes Nakeita Styles, Ines Bloomer, MD  Prucalopride Succinate (MOTEGRITY) 2 MG TABS Take 1 tablet (2 mg total) by mouth every other day. 12/04/18  Yes Danis, Kirke Corin, MD  rosuvastatin (CRESTOR) 20 MG tablet Take 1 tablet (20 mg total) by mouth daily. 03/18/19  Yes Lopez Dentinger, Ines Bloomer, MD  triamcinolone (NASACORT) 55 MCG/ACT AERO nasal inhaler Place 2 sprays into the nose daily. 03/18/19  Yes Britain Anagnos, Ines Bloomer, MD  mupirocin ointment (BACTROBAN) 2 % Sig to affected area twice a day x 7 days. 03/18/19   Horald Pollen, MD    Allergies  Allergen Reactions  . Lactose Intolerance  (Gi)   . Bupropion Nausea Only  . Niaspan [Niacin] Rash    Itching    Patient Active Problem List   Diagnosis Date Noted  . Chronic neck pain 11/07/2017  . Osteoarthritis involving multiple joints on both sides of body 12/01/2006  . HYPERLIPIDEMIA 05/12/2006  . COLONIC POLYPS, BENIGN, HX OF 05/12/2006    Past Medical History:  Diagnosis Date  . Anxiety   . Colon polyps   . Depression   . GERD (gastroesophageal reflux disease)   . High cholesterol   . Osteopenia     Past Surgical History:  Procedure Laterality Date  . BREAST LUMPECTOMY Left   . CERVICAL SPINE SURGERY    . CESAREAN SECTION    . LEFT HEART CATHETERIZATION WITH CORONARY ANGIOGRAM N/A 06/07/2012   Procedure: LEFT HEART CATHETERIZATION WITH CORONARY ANGIOGRAM;  Surgeon: Laverda Page, MD;  Location: [facility] CATH LAB;  Service: Cardiovascular;  Laterality: N/A;    Social History   Socioeconomic History  . Marital status: Legally Separated    Spouse name: Not on file  . Number of children: 2  . Years of education: Not on file  . Highest education level: Not on file  Occupational History  . Occupation: retired  Tobacco Use  . Smoking status: Never Smoker  . Smokeless tobacco: Never Used  Substance and Sexual Activity  . Alcohol use: Not Currently  .  Drug use: Not Currently  . Sexual activity: Not Currently  Other Topics Concern  . Not on file  Social History Narrative  . Not on file   Social Determinants of Health   Financial Resource Strain:   . Difficulty of Paying Living Expenses: Not on file  Food Insecurity:   . Worried About Charity fundraiser in the Last Year: Not on file  . Ran Out of Food in the Last Year: Not on file  Transportation Needs:   . Lack of Transportation (Medical): Not on file  . Lack of Transportation (Non-Medical): Not on file  Physical Activity:   . Days of Exercise per Week: Not on file  . Minutes of Exercise per Session: Not on file  Stress:   . Feeling of Stress :  Not on file  Social Connections:   . Frequency of Communication with Friends and Family: Not on file  . Frequency of Social Gatherings with Friends and Family: Not on file  . Attends Religious Services: Not on file  . Active Member of Clubs or Organizations: Not on file  . Attends Archivist Meetings: Not on file  . Marital Status: Not on file  Intimate Partner Violence:   . Fear of Current or Ex-Partner: Not on file  . Emotionally Abused: Not on file  . Physically Abused: Not on file  . Sexually Abused: Not on file    Family History  Problem Relation Age of Onset  . Heart disease Mother   . Heart disease Father   . Colon polyps Father   . Kidney disease Maternal Grandmother   . Stomach cancer Paternal Grandmother   . Diabetes Paternal Grandmother   . Esophageal cancer Neg Hx   . Colon cancer Neg Hx      Review of Systems  Constitutional: Negative.  Negative for chills and fever.  HENT: Negative.  Negative for congestion and sore throat.   Respiratory: Negative.  Negative for cough and shortness of breath.   Cardiovascular: Negative.  Negative for chest pain and palpitations.  Gastrointestinal: Negative.  Negative for abdominal pain, blood in stool, diarrhea, nausea and vomiting.  Genitourinary: Negative.  Negative for dysuria and hematuria.  Musculoskeletal: Positive for joint pain.  Skin: Negative.  Negative for rash.  Neurological: Negative.  Negative for dizziness and headaches.  Endo/Heme/Allergies: Negative.   All other systems reviewed and are negative.  Today's Vitals   03/18/19 1359  BP: 124/70  Pulse: (!) 59  Resp: 16  Temp: 97.7 F (36.5 C)  TempSrc: Temporal  SpO2: 99%  Weight: 177 lb (80.3 kg)  Height: 5' 2.75" (1.594 m)   Body mass index is 31.6 kg/m.   Physical Exam Vitals reviewed.  Constitutional:      Appearance: Normal appearance.  HENT:     Head: Normocephalic.  Eyes:     Extraocular Movements: Extraocular movements intact.      Pupils: Pupils are equal, round, and reactive to light.  Cardiovascular:     Rate and Rhythm: Normal rate and regular rhythm.     Pulses: Normal pulses.     Heart sounds: Normal heart sounds.  Pulmonary:     Effort: Pulmonary effort is normal.     Breath sounds: Normal breath sounds.  Musculoskeletal:        General: Normal range of motion.     Cervical back: Normal range of motion and neck supple.  Skin:    General: Skin is warm and dry.  Capillary Refill: Capillary refill takes less than 2 seconds.  Neurological:     General: No focal deficit present.     Mental Status: She is alert and oriented to person, place, and time.  Psychiatric:        Mood and Affect: Mood normal.        Behavior: Behavior normal.      ASSESSMENT & PLAN: Clinically stable.  No medical concerns identified during this visit.  Continue present medications.  No changes.  Follow-up in 6 months. Christine Hawkins was seen today for medication refill.  Diagnoses and all orders for this visit:  Dyslipidemia -     rosuvastatin (CRESTOR) 20 MG tablet; Take 1 tablet (20 mg total) by mouth daily.  Osteoarthritis involving multiple joints on both sides of body  Chronic allergic rhinitis -     triamcinolone (NASACORT) 55 MCG/ACT AERO nasal inhaler; Place 2 sprays into the nose daily.  Colon cancer screening -     Cologuard  Other orders -     cetirizine (ZYRTEC) 10 MG tablet; Take 1 tablet (10 mg total) by mouth daily. -     PARoxetine (PAXIL) 20 MG tablet; Take 2 tablets (40 mg total) by mouth daily. -     mupirocin ointment (BACTROBAN) 2 %; Sig to affected area twice a day x 7 days.    Patient Instructions  Health Maintenance After Age 68 After age 64, you are at a higher risk for certain long-term diseases and infections as well as injuries from falls. Falls are a major cause of broken bones and head injuries in people who are older than age 26. Getting regular preventive care can help to keep you  healthy and well. Preventive care includes getting regular testing and making lifestyle changes as recommended by your health care provider. Talk with your health care provider about:  Which screenings and tests you should have. A screening is a test that checks for a disease when you have no symptoms.  A diet and exercise plan that is right for you. What should I know about screenings and tests to prevent falls? Screening and testing are the best ways to find a health problem early. Early diagnosis and treatment give you the best chance of managing medical conditions that are common after age 69. Certain conditions and lifestyle choices may make you more likely to have a fall. Your health care provider may recommend:  Regular vision checks. Poor vision and conditions such as cataracts can make you more likely to have a fall. If you wear glasses, make sure to get your prescription updated if your vision changes.  Medicine review. Work with your health care provider to regularly review all of the medicines you are taking, including over-the-counter medicines. Ask your health care provider about any side effects that may make you more likely to have a fall. Tell your health care provider if any medicines that you take make you feel dizzy or sleepy.  Osteoporosis screening. Osteoporosis is a condition that causes the bones to get weaker. This can make the bones weak and cause them to break more easily.  Blood pressure screening. Blood pressure changes and medicines to control blood pressure can make you feel dizzy.  Strength and balance checks. Your health care provider may recommend certain tests to check your strength and balance while standing, walking, or changing positions.  Foot health exam. Foot pain and numbness, as well as not wearing proper footwear, can make you more likely to have  a fall.  Depression screening. You may be more likely to have a fall if you have a fear of falling, feel  emotionally low, or feel unable to do activities that you used to do.  Alcohol use screening. Using too much alcohol can affect your balance and may make you more likely to have a fall. What actions can I take to lower my risk of falls? General instructions  Talk with your health care provider about your risks for falling. Tell your health care provider if: ? You fall. Be sure to tell your health care provider about all falls, even ones that seem minor. ? You feel dizzy, sleepy, or off-balance.  Take over-the-counter and prescription medicines only as told by your health care provider. These include any supplements.  Eat a healthy diet and maintain a healthy weight. A healthy diet includes low-fat dairy products, low-fat (lean) meats, and fiber from whole grains, beans, and lots of fruits and vegetables. Home safety  Remove any tripping hazards, such as rugs, cords, and clutter.  Install safety equipment such as grab bars in bathrooms and safety rails on stairs.  Keep rooms and walkways well-lit. Activity   Follow a regular exercise program to stay fit. This will help you maintain your balance. Ask your health care provider what types of exercise are appropriate for you.  If you need a cane or walker, use it as recommended by your health care provider.  Wear supportive shoes that have nonskid soles. Lifestyle  Do not drink alcohol if your health care provider tells you not to drink.  If you drink alcohol, limit how much you have: ? 0-1 drink a day for women. ? 0-2 drinks a day for men.  Be aware of how much alcohol is in your drink. In the [place] one drink equals one typical bottle of beer (12 oz), one-half glass of wine (5 oz), or one shot of hard liquor (1 oz).  Do not use any products that contain nicotine or tobacco, such as cigarettes and e-cigarettes. If you need help quitting, ask your health care provider. Summary  Having a healthy lifestyle and getting preventive  care can help to protect your health and wellness after age 86.  Screening and testing are the best way to find a health problem early and help you avoid having a fall. Early diagnosis and treatment give you the best chance for managing medical conditions that are more common for people who are older than age 70.  Falls are a major cause of broken bones and head injuries in people who are older than age 51. Take precautions to prevent a fall at home.  Work with your health care provider to learn what changes you can make to improve your health and wellness and to prevent falls. This information is not intended to replace advice given to you by your health care provider. Make sure you discuss any questions you have with your health care provider. Document Revised: 05/17/2018 Document Reviewed: 12/07/2016 Elsevier Patient Education  2020 [employer]      Agustina Caroli, MD [facility]

## 2019-03-19 ENCOUNTER — Telehealth: Payer: Self-pay | Admitting: *Deleted

## 2019-03-19 NOTE — Telephone Encounter (Signed)
Faxed requisition for Cologuard to [facility]. Confirmation page 8:46 am. Copy to PCP lab to file.

## 2019-04-09 LAB — COLOGUARD: Cologuard: NEGATIVE

## 2019-04-09 NOTE — Progress Notes (Signed)
Letter sent.

## 2019-05-23 ENCOUNTER — Telehealth: Payer: Self-pay

## 2019-05-23 NOTE — Telephone Encounter (Signed)
NP Alice called to report that patient has an a1c of 6.1 and that she feels the pt has peripheral artery disease developing in the leg.

## 2019-05-27 NOTE — Telephone Encounter (Signed)
FYI - this is your patient.

## 2019-08-08 ENCOUNTER — Other Ambulatory Visit: Payer: Self-pay

## 2019-08-08 ENCOUNTER — Encounter: Payer: Self-pay | Admitting: Emergency Medicine

## 2019-08-08 ENCOUNTER — Ambulatory Visit (INDEPENDENT_AMBULATORY_CARE_PROVIDER_SITE_OTHER): Payer: Medicare Other | Admitting: Emergency Medicine

## 2019-08-08 VITALS — BP 125/53 | HR 59 | Temp 98.0°F | Resp 16 | Ht 62.75 in | Wt 175.0 lb

## 2019-08-08 DIAGNOSIS — E785 Hyperlipidemia, unspecified: Secondary | ICD-10-CM | POA: Diagnosis not present

## 2019-08-08 DIAGNOSIS — M159 Polyosteoarthritis, unspecified: Secondary | ICD-10-CM

## 2019-08-08 DIAGNOSIS — R7303 Prediabetes: Secondary | ICD-10-CM | POA: Diagnosis not present

## 2019-08-08 NOTE — Progress Notes (Signed)
Christine Hawkins 64 y.o.   Chief Complaint  Patient presents with   Extremity Weakness    for 6 months - pt will explain and she had a nurse visit at home     HISTORY OF PRESENT ILLNESS: This is a 64 y.o. female complaining of bilateral lower extremity pain for the past few years along with occasional weakness.  Had visiting nurse come to her house who mentioned her pulses in the lower extremities were low and to have it checked with her PCP. No other associated symptoms.  Has history of osteoarthritis in multiple sites.  Chronic joint pains. Has history of dyslipidemia for many years.  Has been on statin therapy for many years.  Requesting lipid profile and blood work.  HPI   Prior to Admission medications   Medication Sig Start Date End Date Taking? Authorizing Provider  aspirin EC 81 MG tablet Take 81 mg by mouth daily.   Yes [provider]  cetirizine (ZYRTEC) 10 MG tablet Take 1 tablet (10 mg total) by mouth daily. 03/18/19  Yes Alysandra Lobue, Ines Bloomer, MD  cyclobenzaprine (FLEXERIL) 10 MG tablet Take 1 tablet (10 mg total) by mouth 2 (two) times daily as needed for muscle spasms. 12/17/18  Yes Dorie Rank, MD  ibuprofen (ADVIL) 400 MG tablet Take 1 tablet (400 mg total) by mouth every 8 (eight) hours as needed. 12/17/18  Yes Dorie Rank, MD  Multiple Vitamins-Minerals (CENTRUM SILVER 50+WOMEN) TABS Take by mouth daily.   Yes [provider]  mupirocin ointment (BACTROBAN) 2 % Sig to affected area twice a day x 7 days. 03/18/19  Yes Latrenda Irani, Ines Bloomer, MD  Omega-3 Fatty Acids (OMEGA 3 PO) Take by mouth daily.   Yes [provider]  rosuvastatin (CRESTOR) 20 MG tablet Take 1 tablet (20 mg total) by mouth daily. 03/18/19  Yes Claudio Mondry, Ines Bloomer, MD  triamcinolone (NASACORT) 55 MCG/ACT AERO nasal inhaler Place 2 sprays into the nose daily. 03/18/19  Yes Jaxiel Kines, Ines Bloomer, MD  Cholecalciferol (VITAMIN D3) 125 MCG (5000 UT) CAPS Take by mouth daily. Patient not  taking: Reported on 08/08/2019    [provider]  [facility] COENZYME Q10 PO Take 100 mg by mouth. Patient not taking: Reported on 08/08/2019    [provider]  PARoxetine (PAXIL) 20 MG tablet Take 2 tablets (40 mg total) by mouth daily. 03/18/19 06/16/19  Horald Pollen, MD  Prucalopride Succinate (MOTEGRITY) 2 MG TABS Take 1 tablet (2 mg total) by mouth every other day. Patient not taking: Reported on 08/08/2019 12/04/18   Doran Stabler, MD    Allergies  Allergen Reactions   Lactose Intolerance (Gi)    Bupropion Nausea Only   Niaspan [Niacin] Rash    Itching    Patient Active Problem List   Diagnosis Date Noted   Chronic neck pain 11/07/2017   Osteoarthritis involving multiple joints on both sides of body 12/01/2006   HYPERLIPIDEMIA 05/12/2006   COLONIC POLYPS, BENIGN, HX OF 05/12/2006    Past Medical History:  Diagnosis Date   Anxiety    Colon polyps    Depression    GERD (gastroesophageal reflux disease)    High cholesterol    Osteopenia     Past Surgical History:  Procedure Laterality Date   BREAST LUMPECTOMY Left    CERVICAL SPINE SURGERY     CESAREAN SECTION     LEFT HEART CATHETERIZATION WITH CORONARY ANGIOGRAM N/A 06/07/2012   Procedure: LEFT HEART CATHETERIZATION WITH CORONARY ANGIOGRAM;  Surgeon: Laverda Page, MD;  Location[facility] CATH LAB;  Service: Cardiovascular;  Laterality: N/A;    Social History   Socioeconomic History   Marital status: Legally Separated    Spouse name: Not on file   Number of children: 2   Years of education: Not on file   Highest education level: Not on file  Occupational History   Occupation: retired  Tobacco Use   Smoking status: Never Smoker   Smokeless tobacco: Never Used  Scientific laboratory technician Use: Never used  Substance and Sexual Activity   Alcohol use: Not Currently   Drug use: Not Currently   Sexual activity: Not Currently  Other Topics Concern   Not on file  Social  History Narrative   Not on file   Social Determinants of Health   Financial Resource Strain:    Difficulty of Paying Living Expenses:   Food Insecurity:    Worried About Charity fundraiser in the Last Year:    Arboriculturist in the Last Year:   Transportation Needs:    Film/video editor (Medical):    Lack of Transportation (Non-Medical):   Physical Activity:    Days of Exercise per Week:    Minutes of Exercise per Session:   Stress:    Feeling of Stress :   Social Connections:    Frequency of Communication with Friends and Family:    Frequency of Social Gatherings with Friends and Family:    Attends Religious Services:    Active Member of Clubs or Organizations:    Attends Music therapist:    Marital Status:   Intimate Partner Violence:    Fear of Current or Ex-Partner:    Emotionally Abused:    Physically Abused:    Sexually Abused:     Family History  Problem Relation Age of Onset   Heart disease Mother    Heart disease Father    Colon polyps Father    Kidney disease Maternal Grandmother    Stomach cancer Paternal Grandmother    Diabetes Paternal Grandmother    Esophageal cancer Neg Hx    Colon cancer Neg Hx      Review of Systems  Constitutional: Negative.  Negative for chills and fever.  HENT: Negative.  Negative for congestion and sore throat.   Respiratory: Negative.  Negative for cough and shortness of breath.   Cardiovascular: Negative.  Negative for chest pain and palpitations.  Gastrointestinal: Negative.  Negative for abdominal pain, diarrhea, nausea and vomiting.  Musculoskeletal: Positive for joint pain.  Skin: Negative.  Negative for rash.  Neurological: Negative.  Negative for dizziness and headaches.  All other systems reviewed and are negative.   Today's Vitals   08/08/19 1434  BP: (!) 125/53  Pulse: (!) 59  Resp: 16  Temp: 98 F (36.7 C)  TempSrc: Temporal  SpO2: 98%  Weight: 175 lb  (79.4 kg)  Height: 5' 2.75" (1.594 m)   Body mass index is 31.25 kg/m.  Physical Exam Vitals reviewed.  Constitutional:      Appearance: Normal appearance.  HENT:     Head: Normocephalic.     Mouth/Throat:     Mouth: Mucous membranes are moist.     Pharynx: Oropharynx is clear.  Eyes:     Extraocular Movements: Extraocular movements intact.     Pupils: Pupils are equal, round, and reactive to light.  Cardiovascular:     Rate and Rhythm: Normal rate and regular rhythm.  Pulses: Normal pulses.     Heart sounds: Normal heart sounds.  Pulmonary:     Effort: Pulmonary effort is normal.     Breath sounds: Normal breath sounds.  Musculoskeletal:        General: Normal range of motion.     Cervical back: Normal range of motion and neck supple.     Right lower leg: No edema.     Left lower leg: No edema.     Comments: Lower extremities: Warm to touch.  No erythema, swelling, or tenderness.  Excellent peripheral pulses and excellent capillary refill.  Neurovascularly intact with good distal sensation.  No signs of circulatory deficiency.  No skin changes.  No varicose veins.  Normal exam.  Skin:    General: Skin is warm and dry.     Capillary Refill: Capillary refill takes less than 2 seconds.  Neurological:     General: No focal deficit present.     Mental Status: She is alert and oriented to person, place, and time.     Sensory: No sensory deficit.     Motor: No weakness.     Coordination: Coordination normal.     Gait: Gait normal.     Deep Tendon Reflexes: Reflexes normal.  Psychiatric:        Mood and Affect: Mood normal.        Behavior: Behavior normal.    A total of 30 minutes was spent with the patient, greater than 50% of which was in counseling/coordination of care regarding multiple chronic medical problems and differential diagnosis of her symptoms, review of all medications, review of most recent office visit notes, review of most recent blood work results, diet  and nutrition, prognosis and need for follow-up.   ASSESSMENT & PLAN: Christine Hawkins was seen today for extremity weakness.  Diagnoses and all orders for this visit:  Osteoarthritis involving multiple joints on both sides of body  Dyslipidemia -     Comprehensive metabolic panel -     Lipid panel -     CK  Prediabetes -     Hemoglobin A1c    Patient Instructions       If you have lab work done today you will be contacted with your lab results within the next 2 weeks.  If you have not heard from [place] then please contact us. The fastest way to get your results is to register for My Chart.   IF you received an x-ray today, you will receive an invoice from [facility]. Please contact [facility] at [phone] with questions or concerns regarding your invoice.   IF you received labwork today, you will receive an invoice from [place]. Please contact [facility] at [phone] with questions or concerns regarding your invoice.   Our billing staff will not be able to assist you with questions regarding bills from these companies.  You will be contacted with the lab results as soon as they are available. The fastest way to get your results is to activate your My Chart account. Instructions are located on the last page of this paperwork. If you have not heard from [place] regarding the results in 2 weeks, please contact this office.     Artrosis Osteoarthritis  La artrosis es un tipo de artritis que afecta el tejido que cubre los extremos de los huesos en las articulaciones (cartlago). El cartlago acta como amortiguador [employer] y los ayuda a moverse con suavidad. La artrosis se produce cuando Charity fundraiser de las  articulaciones se gasta. A veces, la artrosis se denomina artritis "por uso y desgaste". La artrosis es la forma ms frecuente de artritis. A menudo, afecta a las [employer]. Es una enfermedad que empeora con el tiempo (una enfermedad progresiva). Esta  enfermedad afecta con ms frecuencia las articulaciones de:  Los dedos de [employer].  Los dedos Kellogg.  La cadera.  Las rodillas.  La columna vertebral, incluido el cuello y la zona lumbar. Cules son las causas? El desgaste del cartlago que cubre los extremos de los huesos relacionado con la edad, causa esta afeccin. Qu incrementa el riesgo? Los siguientes factores pueden hacer que usted sea ms propenso a Best boy esta afeccin:  Edad avanzada.  Tener exceso de [place] u obesidad.  Uso excesivo de las articulaciones, como en el caso de los [place].  Lesin pasada de Insurance claims handler.  Ciruga pasada en una articulacin.  Antecedentes familiares de artrosis. Cules son los signos o los sntomas? Los principales sntomas de esta enfermedad son dolor, hinchazn y Advertising account executive. Con el tiempo, la articulacin puede perder su forma. Pequeos trozos de [employer] o Biomedical scientist y flotar dentro de la articulacin, lo cual puede causar ms dolor y Agricultural consultant. Pueden formarse pequeos depsitos de hueso (ostefitos) en los extremos de Water engineer. Otros sntomas pueden incluir lo siguiente:  Una sensacin de chirrido o raspado dentro de la articulacin al moverla.  Sonidos de chasquido o crujido al [employer]. Los sntomas pueden afectar una o ms articulaciones. La artrosis en una articulacin principal, como la rodilla o la cadera, puede causar dolor al caminar o al realizar ejercicio. Si tiene artrosis IAC[employer]InterActiveCorp, es posible que no pueda agarrar objetos, torcer la mano o controlar pequeos movimientos de las manos y los dedos (motricidad fina). Cmo se diagnostica? Esta afeccin se puede diagnosticar en funcin de lo siguiente:  Sus antecedentes mdicos.  Un examen fsico.  Sus sntomas.  Radiografas de la(s) articulacin(es) afectada(s).  Anlisis de sangre para descartar otros tipos de artritis. Cmo se trata? No hay cura  para esta enfermedad, pero el tratamiento puede ayudar a Financial controller y Teacher, English as a foreign language el funcionamiento de Water engineer. Los planes de tratamiento pueden incluir:  Un programa de ejercicio indicado que permita el descanso y el alivio de la articulacin. Puede trabajar con un fisioterapeuta.  Un plan de control del peso.  Tcnicas de [employer]ident, como: ? Aplicacin de calor y fro en la articulacin. ? Impulsos elctricos aplicados a las terminaciones nerviosas que se encuentran debajo de la piel (estimulacin nerviosa elctrica transcutnea o TENS). ? Masajes. ? Ciertos suplementos nutricionales.  Antiinflamatorios no esteroideos (AINE) o medicamentos recetados para ayudar a Best boy.  Medicamentos para ayudar a Best boy y la inflamacin (corticoesteroides). Estos se pueden administrar por boca (va oral) o mediante una inyeccin.  Dispositivos de [place], como un dispositivo ortopdico, una frula, un guante especial o un bastn.  Ciruga, como: ? Imelda Pillow. Se hace para volver a posicionar los [employer] Services y Best boy o para Charity fundraiser los trozos sueltos de hueso y Database administrator. ? Ciruga de reemplazo articular. Es posible que necesite esta ciruga si tiene una artrosis muy grave (avanzada). Siga estas indicaciones en su casa: Lequire articulaciones afectadas segn las indicaciones del mdico.  No conduzca ni use maquinaria pesada mientras toma analgsicos recetados.  Haga ejercicio segn le indiquen. Es posible que el mdico o el fisioterapeuta le recomienden  tipos especficos de ejercicio, tales como: ? Ejercicios de fortalecimiento. Se realizan para fortalecer los msculos que sostienen las articulaciones afectadas por la artritis. Pueden realizarse con peso o con bandas para agregar resistencia. ? Ejercicios aerbicos. Son ejercicios, como caminar a paso ligero o hacer gimnasia [place] acutica, que aumentan la actividad del  corazn. ? Actividades de amplitud de movimientos. [place] articulaciones. ? Ejercicios de equilibrio y [place]. Control del dolor, la rigidez y la hinchazn      Si se lo indican, aplique calor en la zona afectada con la frecuencia que le haya indicado el mdico. Use la fuente de calor que el mdico le recomiende, como una compresa de calor hmedo o una almohadilla trmica. ? Si tiene un dispositivo de ayuda que se puede quitar, quteselo segn lo indicado por su mdico. ? Coloque una toalla entre la piel y la fuente de Freight forwarder. Si el mdico le indica que no se quite el dispositivo de HCA Inc se Passenger transport manager, coloque una toalla entre el dispositivo de [place] y la fuente de Freight forwarder. ? Aplique el calor durante 20 a 5minutos. ? Retire la fuente de calor si la piel se pone de color rojo brillante. Esto es muy importante si no puede Education officer, environmental, calor o fro. Puede correr un riesgo mayor de sufrir quemaduras.  Si se lo indican, aplique hielo sobre la articulacin afectada: ? Si tiene un dispositivo de ayuda que se puede quitar, quteselo segn lo indicado por su mdico. ? Ponga el hielo en una bolsa plstica. ? Coloque una [employer] piel y la bolsa de hielo. Si el mdico le indica que no se quite el dispositivo de HCA Inc se aplica hielo, coloque una toalla entre el dispositivo de [place] y la bolsa de hielo. ? Coloque el hielo durante 67minutos, 2 a 3veces por da. Instrucciones generales  Delphi de venta libre y los recetados solamente como se lo haya indicado el mdico.  Mantenga un peso saludable. Siga las instrucciones de su mdico con respecto al control de su peso. Estas pueden incluir restricciones en la dieta.  No consuma ningn producto que contenga nicotina o tabaco, como cigarrillos y Psychologist, sport and exercise. Estos pueden retrasar la consolidacin del [place]. Si necesita ayuda para dejar de fumar, consulte al [employer].  Use los  dispositivos de [employer] Corporation se lo haya indicado el mdico.  Concurra a todas las visitas de seguimiento como se lo haya indicado el mdico. Esto es importante. Dnde encontrar ms informacin:  Air traffic controller de Reumatismo Articular y Arboriculturist Musculoesquelticas y Insurance underwriter [facility] of Arthritis and Musculoskeletal and Skin Diseases): www.niams.SouthExposed.es  [place] ([employer] on Aging): http://kim-miller.com/  Instituto Estadounidense de Reumatologa ([place] of Rheumatology): www.rheumatology.org Comunquese con un mdico si:  Su piel se pone roja.  Presenta una erupcin cutnea.  Siente un dolor que [place].  Tiene fiebre y siente dolor en la articulacin o el msculo. Solicite ayuda de inmediato si:  [employer] Scripts.  Pierde el apetito repentinamente.  Transpira durante la noche. Resumen  La artrosis es una clase de artritis que afecta el tejido que cubre los extremos de los huesos en las articulaciones (cartlago).  El desgaste del cartlago que cubre los extremos de los huesos relacionado con la edad, causa esta afeccin.  Los sntomas principales de esta enfermedad son dolor, hinchazn y Advertising account executive.  No hay cura para esta enfermedad, pero el tratamiento puede ayudar a Freight forwarder  dolor y Teacher, English as a foreign language el funcionamiento de Water engineer. Esta informacin no tiene Marine scientist el consejo del mdico. Asegrese de hacerle al mdico cualquier pregunta que tenga. Document Revised: 04/24/2017 Document Reviewed: 10/01/2012 Elsevier Patient Education  2020 Elsevier Inc.      Agustina Caroli, MD [facility]

## 2019-08-08 NOTE — Patient Instructions (Addendum)
   If you have lab work done today you will be contacted with your lab results within the next 2 weeks.  If you have not heard from us then please contact us. The fastest way to get your results is to register for My Chart.   IF you received an x-ray today, you will receive an invoice from [facility]. Please contact [facility] at [phone] with questions or concerns regarding your invoice.   IF you received labwork today, you will receive an invoice from [facility]. Please contact [facility] at [phone] with questions or concerns regarding your invoice.   Our billing staff will not be able to assist you with questions regarding bills from these companies.  You will be contacted with the lab results as soon as they are available. The fastest way to get your results is to activate your My Chart account. Instructions are located on the last page of this paperwork. If you have not heard from us regarding the results in 2 weeks, please contact this office.      Artrosis Osteoarthritis  La artrosis es un tipo de artritis que afecta el tejido que cubre los extremos de los huesos en las articulaciones (cartlago). El cartlago acta como amortiguador entre los huesos y los ayuda a moverse con suavidad. La artrosis se produce cuando el cartlago de las articulaciones se gasta. A veces, la artrosis se denomina artritis "por uso y desgaste". La artrosis es la forma ms frecuente de artritis. A menudo, afecta a las personas mayores. Es una enfermedad que empeora con el tiempo (una enfermedad progresiva). Esta enfermedad afecta con ms frecuencia las articulaciones de:  Los dedos de las manos.  Los dedos de los pies.  La cadera.  Las rodillas.  La columna vertebral, incluido el cuello y la zona lumbar. Cules son las causas? El desgaste del cartlago que cubre los extremos de los huesos relacionado con la edad, causa esta afeccin. Qu incrementa el riesgo? Los  siguientes factores pueden hacer que usted sea ms propenso a tener esta afeccin:  Edad avanzada.  Tener exceso de peso u obesidad.  Uso excesivo de las articulaciones, como en el caso de los atletas.  Lesin pasada de una articulacin.  Ciruga pasada en una articulacin.  Antecedentes familiares de artrosis. Cules son los signos o los sntomas? Los principales sntomas de esta enfermedad son dolor, hinchazn y rigidez en la articulacin. Con el tiempo, la articulacin puede perder su forma. Pequeos trozos de hueso o cartlago se pueden desprender y flotar dentro de la articulacin, lo cual puede causar ms dolor y dao en la articulacin. Pueden formarse pequeos depsitos de hueso (ostefitos) en los extremos de la articulacin. Otros sntomas pueden incluir lo siguiente:  Una sensacin de chirrido o raspado dentro de la articulacin al moverla.  Sonidos de chasquido o crujido al moverse. Los sntomas pueden afectar una o ms articulaciones. La artrosis en una articulacin principal, como la rodilla o la cadera, puede causar dolor al caminar o al realizar ejercicio. Si tiene artrosis en las manos, es posible que no pueda agarrar objetos, torcer la mano o controlar pequeos movimientos de las manos y los dedos (motricidad fina). Cmo se diagnostica? Esta afeccin se puede diagnosticar en funcin de lo siguiente:  Sus antecedentes mdicos.  Un examen fsico.  Sus sntomas.  Radiografas de la(s) articulacin(es) afectada(s).  Anlisis de sangre para descartar otros tipos de artritis. Cmo se trata? No hay cura para esta enfermedad, pero el tratamiento puede ayudar a   controlar el dolor y mejorar el funcionamiento de la articulacin. Los planes de tratamiento pueden incluir:  Un programa de ejercicio indicado que permita el descanso y el alivio de la articulacin. Puede trabajar con un fisioterapeuta.  Un plan de control del peso.  Tcnicas de alivio del dolor,  como: ? Aplicacin de calor y fro en la articulacin. ? Impulsos elctricos aplicados a las terminaciones nerviosas que se encuentran debajo de la piel (estimulacin nerviosa elctrica transcutnea o TENS). ? Masajes. ? Ciertos suplementos nutricionales.  Antiinflamatorios no esteroideos (AINE) o medicamentos recetados para ayudar a aliviar el dolor.  Medicamentos para ayudar a aliviar el dolor y la inflamacin (corticoesteroides). Estos se pueden administrar por boca (va oral) o mediante una inyeccin.  Dispositivos de ayuda, como un dispositivo ortopdico, una frula, un guante especial o un bastn.  Ciruga, como: ? Una osteotoma. Se hace para volver a posicionar los huesos y aliviar el dolor o para retirar los trozos sueltos de hueso y cartlago. ? Ciruga de reemplazo articular. Es posible que necesite esta ciruga si tiene una artrosis muy grave (avanzada). Siga estas indicaciones en su casa: Actividad  Descanse las articulaciones afectadas segn las indicaciones del mdico.  No conduzca ni use maquinaria pesada mientras toma analgsicos recetados.  Haga ejercicio segn le indiquen. Es posible que el mdico o el fisioterapeuta le recomienden tipos especficos de ejercicio, tales como: ? Ejercicios de fortalecimiento. Se realizan para fortalecer los msculos que sostienen las articulaciones afectadas por la artritis. Pueden realizarse con peso o con bandas para agregar resistencia. ? Ejercicios aerbicos. Son ejercicios, como caminar a paso ligero o hacer gimnasia aerbica acutica, que aumentan la actividad del corazn. ? Actividades de amplitud de movimientos. Facilitan el movimiento de las articulaciones. ? Ejercicios de equilibrio y agilidad. Control del dolor, la rigidez y la hinchazn      Si se lo indican, aplique calor en la zona afectada con la frecuencia que le haya indicado el mdico. Use la fuente de calor que el mdico le recomiende, como una compresa de calor  hmedo o una almohadilla trmica. ? Si tiene un dispositivo de ayuda que se puede quitar, quteselo segn lo indicado por su mdico. ? Coloque una toalla entre la piel y la fuente de calor. Si el mdico le indica que no se quite el dispositivo de ayuda mientras se aplica calor, coloque una toalla entre el dispositivo de ayuda y la fuente de calor. ? Aplique el calor durante 20 a 30minutos. ? Retire la fuente de calor si la piel se pone de color rojo brillante. Esto es muy importante si no puede sentir dolor, calor o fro. Puede correr un riesgo mayor de sufrir quemaduras.  Si se lo indican, aplique hielo sobre la articulacin afectada: ? Si tiene un dispositivo de ayuda que se puede quitar, quteselo segn lo indicado por su mdico. ? Ponga el hielo en una bolsa plstica. ? Coloque una toalla entre la piel y la bolsa de hielo. Si el mdico le indica que no se quite el dispositivo de ayuda mientras se aplica hielo, coloque una toalla entre el dispositivo de ayuda y la bolsa de hielo. ? Coloque el hielo durante 20minutos, 2 a 3veces por da. Instrucciones generales  Tome los medicamentos de venta libre y los recetados solamente como se lo haya indicado el mdico.  Mantenga un peso saludable. Siga las instrucciones de su mdico con respecto al control de su peso. Estas pueden incluir restricciones en la dieta.  No consuma ningn   producto que contenga nicotina o tabaco, como cigarrillos y cigarrillos electrnicos. Estos pueden retrasar la consolidacin del hueso. Si necesita ayuda para dejar de fumar, consulte al mdico.  Use los dispositivos de ayuda como se lo haya indicado el mdico.  Concurra a todas las visitas de seguimiento como se lo haya indicado el mdico. Esto es importante. Dnde encontrar ms informacin:  Instituto Nacional de Reumatismo Articular y Enfermedades Musculoesquelticas y Dermatolgicas (National Institute of Arthritis and Musculoskeletal and Skin Diseases):  www.niams.nih.gov  Instituto Nacional sobre el Envejecimiento (National Institute on Aging): www.nia.nih.gov  Instituto Estadounidense de Reumatologa (American College of Rheumatology): www.rheumatology.org Comunquese con un mdico si:  Su piel se pone roja.  Presenta una erupcin cutnea.  Siente un dolor que empeora.  Tiene fiebre y siente dolor en la articulacin o el msculo. Solicite ayuda de inmediato si:  Pierde mucho peso.  Pierde el apetito repentinamente.  Transpira durante la noche. Resumen  La artrosis es una clase de artritis que afecta el tejido que cubre los extremos de los huesos en las articulaciones (cartlago).  El desgaste del cartlago que cubre los extremos de los huesos relacionado con la edad, causa esta afeccin.  Los sntomas principales de esta enfermedad son dolor, hinchazn y rigidez en la articulacin.  No hay cura para esta enfermedad, pero el tratamiento puede ayudar a controlar el dolor y mejorar el funcionamiento de la articulacin. Esta informacin no tiene como fin reemplazar el consejo del mdico. Asegrese de hacerle al mdico cualquier pregunta que tenga. Document Revised: 04/24/2017 Document Reviewed: 10/01/2012 Elsevier Patient Education  2020 [employer]vier Inc.  

## 2019-08-09 LAB — LIPID PANEL
Chol/HDL Ratio: 3.5 ratio (ref 0.0–4.4)
Cholesterol, Total: 207 mg/dL — ABNORMAL HIGH (ref 100–199)
HDL: 60 mg/dL (ref >39)
LDL Chol Calc (NIH): 131 mg/dL — ABNORMAL HIGH (ref 0–99)
Triglycerides: 89 mg/dL (ref 0–149)
VLDL Cholesterol Cal: 16 mg/dL (ref 5–40)

## 2019-08-09 LAB — HEMOGLOBIN A1C
Est. average glucose Bld gHb Est-mCnc: 134 mg/dL
Hgb A1c MFr Bld: 6.3 % — ABNORMAL HIGH (ref 4.8–5.6)

## 2019-08-09 LAB — COMPREHENSIVE METABOLIC PANEL
ALT: 29 IU/L (ref 0–32)
AST: 28 IU/L (ref 0–40)
Albumin/Globulin Ratio: 1.8 (ref 1.2–2.2)
Albumin: 4.5 g/dL (ref 3.8–4.8)
Alkaline Phosphatase: 72 IU/L (ref 48–121)
BUN/Creatinine Ratio: 10 — ABNORMAL LOW (ref 12–28)
BUN: 8 mg/dL (ref 8–27)
Bilirubin Total: 0.5 mg/dL (ref 0.0–1.2)
CO2: 27 mmol/L (ref 20–29)
Calcium: 9.5 mg/dL (ref 8.7–10.3)
Chloride: 104 mmol/L (ref 96–106)
Creatinine, Ser: 0.82 mg/dL (ref 0.57–1.00)
GFR calc Af Amer: 86 mL/min/{1.73_m2} (ref >59)
GFR calc non Af Amer: 75 mL/min/{1.73_m2} (ref >59)
Globulin, Total: 2.5 g/dL (ref 1.5–4.5)
Glucose: 90 mg/dL (ref 65–99)
Potassium: 4.4 mmol/L (ref 3.5–5.2)
Sodium: 145 mmol/L — ABNORMAL HIGH (ref 134–144)
Total Protein: 7 g/dL (ref 6.0–8.5)

## 2019-08-09 LAB — CK: Total CK: 101 U/L (ref 32–182)

## 2019-09-16 ENCOUNTER — Other Ambulatory Visit: Payer: Self-pay

## 2019-09-16 ENCOUNTER — Encounter: Payer: Self-pay | Admitting: Emergency Medicine

## 2019-09-16 ENCOUNTER — Ambulatory Visit (INDEPENDENT_AMBULATORY_CARE_PROVIDER_SITE_OTHER): Payer: Medicare Other | Admitting: Emergency Medicine

## 2019-09-16 VITALS — BP 123/70 | HR 63 | Temp 98.0°F | Resp 16 | Ht 62.75 in | Wt 172.0 lb

## 2019-09-16 DIAGNOSIS — R7303 Prediabetes: Secondary | ICD-10-CM

## 2019-09-16 DIAGNOSIS — M159 Polyosteoarthritis, unspecified: Secondary | ICD-10-CM

## 2019-09-16 DIAGNOSIS — E785 Hyperlipidemia, unspecified: Secondary | ICD-10-CM | POA: Diagnosis not present

## 2019-09-16 DIAGNOSIS — J309 Allergic rhinitis, unspecified: Secondary | ICD-10-CM

## 2019-09-16 NOTE — Patient Instructions (Addendum)
   If you have lab work done today you will be contacted with your lab results within the next 2 weeks.  If you have not heard from us then please contact us. The fastest way to get your results is to register for My Chart.   IF you received an x-ray today, you will receive an invoice from [facility]. Please contact [facility] at [phone] with questions or concerns regarding your invoice.   IF you received labwork today, you will receive an invoice from [facility]. Please contact [facility] at [phone] with questions or concerns regarding your invoice.   Our billing staff will not be able to assist you with questions regarding bills from these companies.  You will be contacted with the lab results as soon as they are available. The fastest way to get your results is to activate your My Chart account. Instructions are located on the last page of this paperwork. If you have not heard from us regarding the results in 2 weeks, please contact this office.      Artrosis Osteoarthritis  La artrosis es un tipo de artritis que afecta el tejido que cubre los extremos de los huesos en las articulaciones (cartlago). El cartlago acta como amortiguador entre los huesos y los ayuda a moverse con suavidad. La artrosis se produce cuando el cartlago de las articulaciones se gasta. A veces, la artrosis se denomina artritis "por uso y desgaste". La artrosis es la forma ms frecuente de artritis. A menudo, afecta a las personas mayores. Es una enfermedad que empeora con el tiempo (una enfermedad progresiva). Esta enfermedad afecta con ms frecuencia las articulaciones de:  Los dedos de las manos.  Los dedos de los pies.  La cadera.  Las rodillas.  La columna vertebral, incluido el cuello y la zona lumbar. Cules son las causas? El desgaste del cartlago que cubre los extremos de los huesos relacionado con la edad, causa esta afeccin. Qu incrementa el riesgo? Los  siguientes factores pueden hacer que usted sea ms propenso a tener esta afeccin:  Edad avanzada.  Tener exceso de peso u obesidad.  Uso excesivo de las articulaciones, como en el caso de los atletas.  Lesin pasada de una articulacin.  Ciruga pasada en una articulacin.  Antecedentes familiares de artrosis. Cules son los signos o los sntomas? Los principales sntomas de esta enfermedad son dolor, hinchazn y rigidez en la articulacin. Con el tiempo, la articulacin puede perder su forma. Pequeos trozos de hueso o cartlago se pueden desprender y flotar dentro de la articulacin, lo cual puede causar ms dolor y dao en la articulacin. Pueden formarse pequeos depsitos de hueso (ostefitos) en los extremos de la articulacin. Otros sntomas pueden incluir lo siguiente:  Una sensacin de chirrido o raspado dentro de la articulacin al moverla.  Sonidos de chasquido o crujido al moverse. Los sntomas pueden afectar una o ms articulaciones. La artrosis en una articulacin principal, como la rodilla o la cadera, puede causar dolor al caminar o al realizar ejercicio. Si tiene artrosis en las manos, es posible que no pueda agarrar objetos, torcer la mano o controlar pequeos movimientos de las manos y los dedos (motricidad fina). Cmo se diagnostica? Esta afeccin se puede diagnosticar en funcin de lo siguiente:  Sus antecedentes mdicos.  Un examen fsico.  Sus sntomas.  Radiografas de la(s) articulacin(es) afectada(s).  Anlisis de sangre para descartar otros tipos de artritis. Cmo se trata? No hay cura para esta enfermedad, pero el tratamiento puede ayudar a   controlar el dolor y mejorar el funcionamiento de la articulacin. Los planes de tratamiento pueden incluir:  Un programa de ejercicio indicado que permita el descanso y el alivio de la articulacin. Puede trabajar con un fisioterapeuta.  Un plan de control del peso.  Tcnicas de alivio del dolor,  como: ? Aplicacin de calor y fro en la articulacin. ? Impulsos elctricos aplicados a las terminaciones nerviosas que se encuentran debajo de la piel (estimulacin nerviosa elctrica transcutnea o TENS). ? Masajes. ? Ciertos suplementos nutricionales.  Antiinflamatorios no esteroideos (AINE) o medicamentos recetados para ayudar a aliviar el dolor.  Medicamentos para ayudar a aliviar el dolor y la inflamacin (corticoesteroides). Estos se pueden administrar por boca (va oral) o mediante una inyeccin.  Dispositivos de ayuda, como un dispositivo ortopdico, una frula, un guante especial o un bastn.  Ciruga, como: ? Una osteotoma. Se hace para volver a posicionar los huesos y aliviar el dolor o para retirar los trozos sueltos de hueso y cartlago. ? Ciruga de reemplazo articular. Es posible que necesite esta ciruga si tiene una artrosis muy grave (avanzada). Siga estas indicaciones en su casa: Actividad  Descanse las articulaciones afectadas segn las indicaciones del mdico.  No conduzca ni use maquinaria pesada mientras toma analgsicos recetados.  Haga ejercicio segn le indiquen. Es posible que el mdico o el fisioterapeuta le recomienden tipos especficos de ejercicio, tales como: ? Ejercicios de fortalecimiento. Se realizan para fortalecer los msculos que sostienen las articulaciones afectadas por la artritis. Pueden realizarse con peso o con bandas para agregar resistencia. ? Ejercicios aerbicos. Son ejercicios, como caminar a paso ligero o hacer gimnasia aerbica acutica, que aumentan la actividad del corazn. ? Actividades de amplitud de movimientos. Facilitan el movimiento de las articulaciones. ? Ejercicios de equilibrio y agilidad. Control del dolor, la rigidez y la hinchazn      Si se lo indican, aplique calor en la zona afectada con la frecuencia que le haya indicado el mdico. Use la fuente de calor que el mdico le recomiende, como una compresa de calor  hmedo o una almohadilla trmica. ? Si tiene un dispositivo de ayuda que se puede quitar, quteselo segn lo indicado por su mdico. ? Coloque una toalla entre la piel y la fuente de calor. Si el mdico le indica que no se quite el dispositivo de ayuda mientras se aplica calor, coloque una toalla entre el dispositivo de ayuda y la fuente de calor. ? Aplique el calor durante 20 a 30minutos. ? Retire la fuente de calor si la piel se pone de color rojo brillante. Esto es muy importante si no puede sentir dolor, calor o fro. Puede correr un riesgo mayor de sufrir quemaduras.  Si se lo indican, aplique hielo sobre la articulacin afectada: ? Si tiene un dispositivo de ayuda que se puede quitar, quteselo segn lo indicado por su mdico. ? Ponga el hielo en una bolsa plstica. ? Coloque una toalla entre la piel y la bolsa de hielo. Si el mdico le indica que no se quite el dispositivo de ayuda mientras se aplica hielo, coloque una toalla entre el dispositivo de ayuda y la bolsa de hielo. ? Coloque el hielo durante 20minutos, 2 a 3veces por da. Instrucciones generales  Tome los medicamentos de venta libre y los recetados solamente como se lo haya indicado el mdico.  Mantenga un peso saludable. Siga las instrucciones de su mdico con respecto al control de su peso. Estas pueden incluir restricciones en la dieta.  No consuma ningn   producto que contenga nicotina o tabaco, como cigarrillos y cigarrillos electrnicos. Estos pueden retrasar la consolidacin del hueso. Si necesita ayuda para dejar de fumar, consulte al mdico.  Use los dispositivos de ayuda como se lo haya indicado el mdico.  Concurra a todas las visitas de seguimiento como se lo haya indicado el mdico. Esto es importante. Dnde encontrar ms informacin:  Instituto Nacional de Reumatismo Articular y Enfermedades Musculoesquelticas y Dermatolgicas (National Institute of Arthritis and Musculoskeletal and Skin Diseases):  www.niams.nih.gov  Instituto Nacional sobre el Envejecimiento (National Institute on Aging): www.nia.nih.gov  Instituto Estadounidense de Reumatologa (American College of Rheumatology): www.rheumatology.org Comunquese con un mdico si:  Su piel se pone roja.  Presenta una erupcin cutnea.  Siente un dolor que empeora.  Tiene fiebre y siente dolor en la articulacin o el msculo. Solicite ayuda de inmediato si:  Pierde mucho peso.  Pierde el apetito repentinamente.  Transpira durante la noche. Resumen  La artrosis es una clase de artritis que afecta el tejido que cubre los extremos de los huesos en las articulaciones (cartlago).  El desgaste del cartlago que cubre los extremos de los huesos relacionado con la edad, causa esta afeccin.  Los sntomas principales de esta enfermedad son dolor, hinchazn y rigidez en la articulacin.  No hay cura para esta enfermedad, pero el tratamiento puede ayudar a controlar el dolor y mejorar el funcionamiento de la articulacin. Esta informacin no tiene como fin reemplazar el consejo del mdico. Asegrese de hacerle al mdico cualquier pregunta que tenga. Document Revised: 04/24/2017 Document Reviewed: 10/01/2012 Elsevier Patient Education  2020 [employer] Inc.  

## 2019-09-17 NOTE — Progress Notes (Signed)
Christine Hawkins 64 y.o.   Chief Complaint  Patient presents with  . Osteoarthritis    follow up 6 months and patient wants the results of cholesterol test    HISTORY OF PRESENT ILLNESS: This is a 64 y.o. female with history of osteoarthritis and dyslipidemia here for follow-up. Doing well has no complaints or medical concerns today. Not vaccinated against Covid.  HPI   Prior to Admission medications   Medication Sig Start Date End Date Taking? Authorizing Provider  Acetaminophen (TYLENOL PO) Take 325 mg by mouth as needed.   Yes [provider]  aspirin EC 81 MG tablet Take 81 mg by mouth daily.   Yes [provider]  cetirizine (ZYRTEC) 10 MG tablet Take 1 tablet (10 mg total) by mouth daily. 03/18/19  Yes Inella Kuwahara, Ines Bloomer, MD  Cholecalciferol (VITAMIN D3) 125 MCG (5000 UT) CAPS Take by mouth daily.    Yes [provider]  [facility] COENZYME Q10 PO Take 100 mg by mouth.    Yes [provider]  cyclobenzaprine (FLEXERIL) 10 MG tablet Take 1 tablet (10 mg total) by mouth 2 (two) times daily as needed for muscle spasms. 12/17/18  Yes Dorie Rank, MD  Multiple Vitamins-Minerals (CENTRUM SILVER 50+WOMEN) TABS Take by mouth daily.   Yes [provider]  mupirocin ointment (BACTROBAN) 2 % Sig to affected area twice a day x 7 days. 03/18/19  Yes Notnamed Scholz, Ines Bloomer, MD  Omega-3 Fatty Acids (OMEGA 3 PO) Take by mouth daily.   Yes [provider]  rosuvastatin (CRESTOR) 20 MG tablet Take 1 tablet (20 mg total) by mouth daily. 03/18/19  Yes Jorgina Binning, Ines Bloomer, MD  triamcinolone (NASACORT) 55 MCG/ACT AERO nasal inhaler Place 2 sprays into the nose daily. 03/18/19  Yes Tishana Clinkenbeard, Ines Bloomer, MD  ibuprofen (ADVIL) 400 MG tablet Take 1 tablet (400 mg total) by mouth every 8 (eight) hours as needed. Patient not taking: Reported on 09/16/2019 12/17/18   Dorie Rank, MD  PARoxetine (PAXIL) 20 MG tablet Take 2 tablets (40 mg total) by mouth daily. 03/18/19 06/16/19   Horald Pollen, MD  Prucalopride Succinate (MOTEGRITY) 2 MG TABS Take 1 tablet (2 mg total) by mouth every other day. Patient not taking: Reported on 09/16/2019 12/04/18   Doran Stabler, MD    Allergies  Allergen Reactions  . Lactose Intolerance (Gi)   . Bupropion Nausea Only  . Niaspan [Niacin] Rash    Itching    Patient Active Problem List   Diagnosis Date Noted  . Chronic neck pain 11/07/2017  . Osteoarthritis involving multiple joints on both sides of body 12/01/2006  . HYPERLIPIDEMIA 05/12/2006  . COLONIC POLYPS, BENIGN, HX OF 05/12/2006    Past Medical History:  Diagnosis Date  . Anxiety   . Colon polyps   . Depression   . GERD (gastroesophageal reflux disease)   . High cholesterol   . Osteopenia     Past Surgical History:  Procedure Laterality Date  . BREAST LUMPECTOMY Left   . CERVICAL SPINE SURGERY    . CESAREAN SECTION    . LEFT HEART CATHETERIZATION WITH CORONARY ANGIOGRAM N/A 06/07/2012   Procedure: LEFT HEART CATHETERIZATION WITH CORONARY ANGIOGRAM;  Surgeon: Laverda Page, MD;  Location: [facility] CATH LAB;  Service: Cardiovascular;  Laterality: N/A;    Social History   Socioeconomic History  . Marital status: Legally Separated    Spouse name: Not on file  . Number of children: 2  . Years of  education: Not on file  . Highest education level: Not on file  Occupational History  . Occupation: retired  Tobacco Use  . Smoking status: Never Smoker  . Smokeless tobacco: Never Used  Vaping Use  . Vaping Use: Never used  Substance and Sexual Activity  . Alcohol use: Not Currently  . Drug use: Not Currently  . Sexual activity: Not Currently  Other Topics Concern  . Not on file  Social History Narrative  . Not on file   Social Determinants of Health   Financial Resource Strain:   . Difficulty of Paying Living Expenses:   Food Insecurity:   . Worried About Charity fundraiser in the Last Year:   . Arboriculturist in the Last Year:     Transportation Needs:   . Film/video editor (Medical):   Marland Kitchen Lack of Transportation (Non-Medical):   Physical Activity:   . Days of Exercise per Week:   . Minutes of Exercise per Session:   Stress:   . Feeling of Stress :   Social Connections:   . Frequency of Communication with Friends and Family:   . Frequency of Social Gatherings with Friends and Family:   . Attends Religious Services:   . Active Member of Clubs or Organizations:   . Attends Archivist Meetings:   Marland Kitchen Marital Status:   Intimate Partner Violence:   . Fear of Current or Ex-Partner:   . Emotionally Abused:   Marland Kitchen Physically Abused:   . Sexually Abused:     Family History  Problem Relation Age of Onset  . Heart disease Mother   . Heart disease Father   . Colon polyps Father   . Kidney disease Maternal Grandmother   . Stomach cancer Paternal Grandmother   . Diabetes Paternal Grandmother   . Esophageal cancer Neg Hx   . Colon cancer Neg Hx      Review of Systems  Constitutional: Negative.  Negative for chills and fever.  HENT: Negative.  Negative for congestion and sore throat.   Respiratory: Negative.  Negative for cough and shortness of breath.   Cardiovascular: Negative.  Negative for chest pain and palpitations.  Gastrointestinal: Negative.  Negative for abdominal pain, diarrhea, melena, nausea and vomiting.  Genitourinary: Negative.  Negative for dysuria and hematuria.  Musculoskeletal: Positive for joint pain.  Skin: Negative.  Negative for rash.  Neurological: Negative.  Negative for dizziness and headaches.  All other systems reviewed and are negative.  Today's Vitals   09/16/19 1417  BP: 123/70  Pulse: 63  Resp: 16  Temp: 98 F (36.7 C)  TempSrc: Temporal  SpO2: 97%  Weight: 172 lb (78 kg)  Height: 5' 2.75" (1.594 m)   Body mass index is 30.71 kg/m.   Physical Exam Vitals reviewed.  Constitutional:      Appearance: Normal appearance.  HENT:     Head: Normocephalic.   Eyes:     Extraocular Movements: Extraocular movements intact.     Conjunctiva/sclera: Conjunctivae normal.     Pupils: Pupils are equal, round, and reactive to light.  Cardiovascular:     Rate and Rhythm: Normal rate and regular rhythm.     Pulses: Normal pulses.     Heart sounds: Normal heart sounds.  Pulmonary:     Effort: Pulmonary effort is normal.     Breath sounds: Normal breath sounds.  Abdominal:     Palpations: Abdomen is soft.     Tenderness: There is no abdominal  tenderness.  Musculoskeletal:     Cervical back: Normal range of motion and neck supple.  Skin:    General: Skin is warm and dry.     Capillary Refill: Capillary refill takes less than 2 seconds.  Neurological:     General: No focal deficit present.     Mental Status: She is alert and oriented to person, place, and time.  Psychiatric:        Mood and Affect: Mood normal.        Behavior: Behavior normal.    A total of 30 minutes was spent with the patient, greater than 50% of which was in counseling/coordination of care regarding multiple chronic medical problems, review of most recent office visit notes, review of most recent blood work results, review of all medications, diet and nutrition, prognosis and need for follow-up.   ASSESSMENT & PLAN: Clinically stable.  No medical concerns identified during this visit.  Rafeef was seen today for osteoarthritis.  Diagnoses and all orders for this visit:  Osteoarthritis involving multiple joints on both sides of body  Dyslipidemia  Prediabetes  Chronic allergic rhinitis    Patient Instructions       If you have lab work done today you will be contacted with your lab results within the next 2 weeks.  If you have not heard from [place] then please contact us. The fastest way to get your results is to register for My Chart.   IF you received an x-ray today, you will receive an invoice from [facility] Radiology. Please contact [facility] Radiology at  [phone] with questions or concerns regarding your invoice.   IF you received labwork today, you will receive an invoice from [facility]. Please contact [facility] at [phone] with questions or concerns regarding your invoice.   Our billing staff will not be able to assist you with questions regarding bills from these companies.  You will be contacted with the lab results as soon as they are available. The fastest way to get your results is to activate your My Chart account. Instructions are located on the last page of this paperwork. If you have not heard from [place] regarding the results in 2 weeks, please contact this office.     Artrosis Osteoarthritis  La artrosis es un tipo de artritis que afecta el tejido que cubre los extremos de los huesos en las articulaciones (cartlago). El cartlago acta como amortiguador [employer] y los ayuda a moverse con suavidad. La artrosis se produce cuando el cartlago de las articulaciones se gasta. A veces, la artrosis se denomina artritis "por uso y desgaste". La artrosis es la forma ms frecuente de artritis. A menudo, afecta a las [employer]. Es una enfermedad que empeora con el tiempo (una enfermedad progresiva). Esta enfermedad afecta con ms frecuencia las articulaciones de:  Los dedos de [employer].  Los dedos Kellogg.  La cadera.  Las rodillas.  La columna vertebral, incluido el cuello y la zona lumbar. Cules son las causas? El desgaste del cartlago que cubre los extremos de los huesos relacionado con la edad, causa esta afeccin. Qu incrementa el riesgo? Los siguientes factores pueden hacer que usted sea ms propenso a Best boy esta afeccin:  Edad avanzada.  Tener exceso de [place] u obesidad.  Uso excesivo de las articulaciones, como en el caso de los [place].  Lesin pasada de Insurance claims handler.  Ciruga pasada en una articulacin.  Antecedentes familiares de artrosis. Cules son los signos o los  sntomas?  Los principales sntomas de esta enfermedad son dolor, hinchazn y Advertising account executive. Con el tiempo, la articulacin puede perder su forma. Pequeos trozos de [employer]xair o Biomedical scientist y flotar dentro de la articulacin, lo cual puede causar ms dolor y Agricultural consultant. Pueden formarse pequeos depsitos de hueso (ostefitos) en los extremos de Water engineer. Otros sntomas pueden incluir lo siguiente:  Una sensacin de chirrido o raspado dentro de la articulacin al moverla.  Sonidos de chasquido o crujido al [employer]. Los sntomas pueden afectar una o ms articulaciones. La artrosis en una articulacin principal, como la rodilla o la cadera, puede causar dolor al caminar o al realizar ejercicio. Si tiene artrosis IAC[employer]InterActiveCorp, es posible que no pueda agarrar objetos, torcer la mano o controlar pequeos movimientos de las manos y los dedos (motricidad fina). Cmo se diagnostica? Esta afeccin se puede diagnosticar en funcin de lo siguiente:  Sus antecedentes mdicos.  Un examen fsico.  Sus sntomas.  Radiografas de la(s) articulacin(es) afectada(s).  Anlisis de sangre para descartar otros tipos de artritis. Cmo se trata? No hay cura para esta enfermedad, pero el tratamiento puede ayudar a Financial controller y Teacher, English as a foreign language el funcionamiento de Water engineer. Los planes de tratamiento pueden incluir:  Un programa de ejercicio indicado que permita el descanso y el alivio de la articulacin. Puede trabajar con un fisioterapeuta.  Un plan de control del peso.  Tcnicas de [employer]um[employer]ident, como: ? Aplicacin de calor y fro en la articulacin. ? Impulsos elctricos aplicados a las terminaciones nerviosas que se encuentran debajo de la piel (estimulacin nerviosa elctrica transcutnea o TENS). ? Masajes. ? Ciertos suplementos nutricionales.  Antiinflamatorios no esteroideos (AINE) o medicamentos recetados para ayudar a Psychologist, prison and probation services.  Medicamentos para ayudar a Best boy y la inflamacin (corticoesteroides). Estos se pueden administrar por boca (va oral) o mediante una inyeccin.  Dispositivos de [place], como un dispositivo ortopdico, una frula, un guante especial o un bastn.  Ciruga, como: ? Imelda Pillow. Se hace para volver a posicionar los [employer] Services y Best boy o para Charity fundraiser los trozos sueltos de hueso y Database administrator. ? Ciruga de reemplazo articular. Es posible que necesite esta ciruga si tiene una artrosis muy grave (avanzada). Siga estas indicaciones en su casa: Fate articulaciones afectadas segn las indicaciones del mdico.  No conduzca ni use maquinaria pesada mientras toma analgsicos recetados.  Haga ejercicio segn le indiquen. Es posible que el mdico o el fisioterapeuta le recomienden tipos especficos de ejercicio, tales como: ? Ejercicios de fortalecimiento. Se realizan para fortalecer los msculos que sostienen las articulaciones afectadas por la artritis. Pueden realizarse con peso o con bandas para agregar resistencia. ? Ejercicios aerbicos. Son ejercicios, como caminar a paso ligero o hacer gimnasia [place] acutica, que aumentan la actividad del corazn. ? Actividades de amplitud de movimientos. [place] articulaciones. ? Ejercicios de equilibrio y [place]. Control del dolor, la rigidez y la hinchazn      Si se lo indican, aplique calor en la zona afectada con la frecuencia que le haya indicado el mdico. Use la fuente de calor que el mdico le recomiende, como una compresa de calor hmedo o una almohadilla trmica. ? Si tiene un dispositivo de ayuda que se puede quitar, quteselo segn lo indicado por su mdico. ? Coloque una toalla entre la piel y la fuente de Freight forwarder. Si el mdico le indica que no se quite el dispositivo de  ayuda mientras se aplica calor, coloque una toalla entre el dispositivo de [place] y la fuente de  Freight forwarder. ? Aplique el calor durante 20 a 62minutos. ? Retire la fuente de calor si la piel se pone de color rojo brillante. Esto es muy importante si no puede Education officer, environmental, calor o fro. Puede correr un riesgo mayor de sufrir quemaduras.  Si se lo indican, aplique hielo sobre la articulacin afectada: ? Si tiene un dispositivo de ayuda que se puede quitar, quteselo segn lo indicado por su mdico. ? Ponga el hielo en una bolsa plstica. ? Coloque una [employer] piel y la bolsa de hielo. Si el mdico le indica que no se quite el dispositivo de HCA Inc se aplica hielo, coloque una toalla entre el dispositivo de [place] y la bolsa de hielo. ? Coloque el hielo durante 32minutos, 2 a 3veces por da. Instrucciones generales  Delphi de venta libre y los recetados solamente como se lo haya indicado el mdico.  Mantenga un peso saludable. Siga las instrucciones de su mdico con respecto al control de su peso. Estas pueden incluir restricciones en la dieta.  No consuma ningn producto que contenga nicotina o tabaco, como cigarrillos y Psychologist, sport and exercise. Estos pueden retrasar la consolidacin del [place]. Si necesita ayuda para dejar de fumar, consulte al [employer].  Use los dispositivos de [employer] Corporation se lo haya indicado el mdico.  Concurra a todas las visitas de seguimiento como se lo haya indicado el mdico. Esto es importante. Dnde encontrar ms informacin:  Air traffic controller de Reumatismo Articular y Arboriculturist Musculoesquelticas y Insurance underwriter [facility] of Arthritis and Musculoskeletal and Skin Diseases): www.niams.SouthExposed.es  [place] ([employer] on Aging): http://kim-miller.com/  Instituto Estadoun[facility]ense de Reumatologa ([place] of Rheumatology): www.rheumatology.org Comunquese con un mdico si:  Su piel se pone roja.  Presenta una erupcin cutnea.  Siente un dolor que [place].  Tiene fiebre y  siente dolor en la articulacin o el msculo. Solicite ayuda de inmediato si:  [employer] Scripts.  Pierde el apetito repentinamente.  Transpira durante la noche. Resumen  La artrosis es una clase de artritis que afecta el tejido que cubre los extremos de los huesos en las articulaciones (cartlago).  El desgaste del cartlago que cubre los extremos de los huesos relacionado con la edad, causa esta afeccin.  Los sntomas principales de esta enfermedad son dolor, hinchazn y Advertising account executive.  No hay cura para esta enfermedad, pero el tratamiento puede ayudar a Financial controller y Teacher, English as a foreign language el funcionamiento de Water engineer. Esta informacin no tiene Marine scientist el consejo del mdico. Asegrese de hacerle al mdico cualquier pregunta que tenga. Document Revised: 04/24/2017 Document Reviewed: 10/01/2012 Elsevier Patient Education  2020 Elsevier Inc.      Agustina Caroli, MD [facility]

## 2019-12-20 ENCOUNTER — Encounter: Payer: Self-pay | Admitting: Gastroenterology
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
## 2019-12-24 ENCOUNTER — Encounter: Payer: Self-pay | Admitting: Gastroenterology

## 2020-01-27 DIAGNOSIS — H02889 Meibomian gland dysfunction of unspecified eye, unspecified eyelid: Secondary | ICD-10-CM | POA: Diagnosis not present

## 2020-01-27 DIAGNOSIS — H2513 Age-related nuclear cataract, bilateral: Secondary | ICD-10-CM | POA: Diagnosis not present

## 2020-02-10 ENCOUNTER — Ambulatory Visit: Payer: Medicare Other | Admitting: Emergency Medicine

## 2020-02-12 ENCOUNTER — Ambulatory Visit (INDEPENDENT_AMBULATORY_CARE_PROVIDER_SITE_OTHER): Payer: Medicare Other | Admitting: Gastroenterology

## 2020-02-12 ENCOUNTER — Encounter: Payer: Self-pay | Admitting: Gastroenterology

## 2020-02-12 ENCOUNTER — Ambulatory Visit: Payer: Self-pay | Admitting: Gastroenterology

## 2020-02-12 VITALS — BP 120/78 | HR 59 | Ht 62.75 in | Wt 173.2 lb

## 2020-02-12 DIAGNOSIS — K625 Hemorrhage of anus and rectum: Secondary | ICD-10-CM

## 2020-02-12 DIAGNOSIS — K5909 Other constipation: Secondary | ICD-10-CM | POA: Diagnosis not present

## 2020-02-12 MED ORDER — CLENPIQ 10-3.5-12 MG-GM -GM/160ML PO SOLN: 1.0000 | Freq: Once | ORAL | 0 refills | Status: AC

## 2020-02-12 NOTE — Progress Notes (Signed)
Chief Complaint: For colonoscopy  Referring Provider:  Georgina Quint, *      ASSESSMENT AND PLAN;   #1.  Rectal bleeding. Nl CBC  #2.  Chronic constipation.  #3.  H/O polyps  Plan: - Colon for further evaluation.  I discussed risks and benefits. - Increase water intake. - High-fiber diet. - Minimize pain medications including OTC NSAIDs. - Start an exercising-like walking for at least 15-20 minutes 3 times a week. - Miralax 17g po qd. - If still with problems, trial of amitiza, linzess or Trulance. - If still with problems, will consider Sitz Marker study followed by anorectal manometry.     HPI:    Christine Hawkins is a 65 y.o. female   Long standing constipation with 1 BMs/2-3 days, hard bowel movements. Occasional lower abdominal crampy pain with bloating Has been having occasional rectal bleeding as well. This is mostly bright red in color. She had normal CBC previously.  For constipation she has responded well to MiraLAX in the past.  She was also given a trial of Motegrity which she stopped due to cost.  It did work somewhat.  No recent weight loss.  Denies having any upper GI symptoms including nausea, vomiting, heartburn, regurgitation, odynophagia or dysphagia.  No fever chills or night sweats.  No melena.   Past GI procedures -Colonoscopy 11/2014 Dr Devona Konig neg, Due 11/2019 -Colonoscopy ([facility]) 08/2009 neg Past Medical History:  Diagnosis Date  . Anxiety   . Colon polyps   . Depression   . GERD (gastroesophageal reflux disease)   . High cholesterol   . Osteopenia     Past Surgical History:  Procedure Laterality Date  . BREAST LUMPECTOMY Left   . CERVICAL SPINE SURGERY      2013 has 8 screws   . CESAREAN SECTION    . LEFT HEART CATHETERIZATION WITH CORONARY ANGIOGRAM N/A 06/07/2012   Procedure: LEFT HEART CATHETERIZATION WITH CORONARY ANGIOGRAM;  Surgeon: Pamella Pert, MD;  Location: [facility] CATH LAB;  Service: Cardiovascular;  Laterality: N/A;     Family History  Problem Relation Age of Onset  . Heart disease Mother   . Heart disease Father   . Colon polyps Father   . Kidney disease Maternal Grandmother   . Stomach cancer Paternal Grandmother   . Diabetes Paternal Grandmother   . Esophageal cancer Neg Hx   . Colon cancer Neg Hx     Social History   Tobacco Use  . Smoking status: Never Smoker  . Smokeless tobacco: Never Used  Vaping Use  . Vaping Use: Never used  Substance Use Topics  . Alcohol use: Not Currently  . Drug use: Not Currently    Current Outpatient Medications  Medication Sig Dispense Refill  . Acetaminophen (TYLENOL PO) Take by mouth as needed.    Marland Kitchen aspirin EC 81 MG tablet Take 81 mg by mouth daily.    Marland Kitchen azelastine (OPTIVAR) 0.05 % ophthalmic solution Apply 1 drop to eye daily.    . CVS COENZYME Q10 PO Take 100 mg by mouth.     . cyclobenzaprine (FLEXERIL) 10 MG tablet Take 1 tablet (10 mg total) by mouth 2 (two) times daily as needed for muscle spasms. 14 tablet 0  . ibuprofen (ADVIL) 400 MG tablet Take 1 tablet (400 mg total) by mouth every 8 (eight) hours as needed. 21 tablet 0  . loratadine (CLARITIN) 10 MG tablet Take 10 mg by mouth daily.    . Multiple Vitamins-Minerals (  CENTRUM SILVER 50+WOMEN) TABS Take by mouth daily.    . mupirocin ointment (BACTROBAN) 2 % Sig to affected area twice a day x 7 days. 22 g 1  . Omega-3 Fatty Acids (OMEGA 3 PO) Take by mouth daily.    Marland Kitchen PARoxetine (PAXIL) 20 MG tablet Take 2 tablets (40 mg total) by mouth daily. (Patient taking differently: Take 20 mg by mouth daily.) 180 tablet 3  . rosuvastatin (CRESTOR) 20 MG tablet Take 1 tablet (20 mg total) by mouth daily. 90 tablet 3  . triamcinolone (NASACORT) 55 MCG/ACT AERO nasal inhaler Place 2 sprays into the nose daily. 1 Inhaler 12   No current facility-administered medications for this visit.    Allergies  Allergen Reactions  . Lactose Intolerance (Gi)   . Other     Dish/laundry dish detergent and  bleach-smell bothers patient and dust  . Bupropion Nausea Only  . Niaspan [Niacin] Rash    Itching    Review of Systems:  Constitutional: Denies fever, chills, diaphoresis, appetite change and fatigue.  HEENT: Denies photophobia, eye pain, redness, hearing loss, ear pain, congestion, sore throat, rhinorrhea, sneezing, mouth sores, neck pain, neck stiffness and tinnitus.   Respiratory: Denies SOB, DOE, cough, chest tightness,  and wheezing.   Cardiovascular: Denies chest pain, palpitations and leg swelling.  Genitourinary: Denies dysuria, urgency, frequency, hematuria, flank pain and difficulty urinating.  Musculoskeletal: Denies myalgias, back pain, joint swelling, arthralgias and gait problem.  Skin: No rash.  Neurological: Denies dizziness, seizures, syncope, weakness, light-headedness, numbness and headaches.  Hematological: Denies adenopathy. Easy bruising, personal or family bleeding history  Psychiatric/Behavioral: has anxiety or depression     Physical Exam:    BP 120/78   Pulse (!) 59   Ht 5' 2.75" (1.594 m)   Wt 173 lb 4 oz (78.6 kg)   BMI 30.93 kg/m  Wt Readings from Last 3 Encounters:  02/12/20 173 lb 4 oz (78.6 kg)  09/16/19 172 lb (78 kg)  08/08/19 175 lb (79.4 kg)   Constitutional:  Well-developed, in no acute distress. Psychiatric: Normal mood and affect. Behavior is normal. HEENT: Pupils normal.  Conjunctivae are normal. No scleral icterus. Neck supple.  Cardiovascular: Normal rate, regular rhythm. No edema Pulmonary/chest: Effort normal and breath sounds normal. No wheezing, rales or rhonchi. Abdominal: Soft, nondistended. Nontender. Bowel sounds active throughout. There are no masses palpable. No hepatomegaly. Rectal: To be performed at the time of colonoscopy Neurological: Alert and oriented to person place and time. Skin: Skin is warm and dry. No rashes noted.  Data Reviewed: I have personally reviewed following labs and imaging studies  CBC: CBC  Latest Ref Rng & Units 10/29/2018 11/09/2017 06/07/2012  WBC 3.4 - 10.8 x10E3/uL 5.8 5.0 7.1  Hemoglobin 11.1 - 15.9 g/dL 12.7 13.0 11.4(L)  Hematocrit 34.0 - 46.6 % 40.1 41.9 35.7(L)  Platelets 150 - 450 x10E3/uL 185 207 187    CMP: CMP Latest Ref Rng & Units 08/08/2019 10/29/2018 11/09/2017  Glucose 65 - 99 mg/dL 90 94 110(H)  BUN 8 - 27 mg/dL 8 10 13   Creatinine 0.57 - 1.00 mg/dL 0.82 0.78 0.87  Sodium 134 - 144 mmol/L 145(H) 140 142  Potassium 3.5 - 5.2 mmol/L 4.4 4.1 4.4  Chloride 96 - 106 mmol/L 104 101 101  CO2 20 - 29 mmol/L 27 27 24   Calcium 8.7 - 10.3 mg/dL 9.5 9.3 9.7  Total Protein 6.0 - 8.5 g/dL 7.0 6.5 6.7  Total Bilirubin 0.0 - 1.2 mg/dL 0.5 0.4  0.5  Alkaline Phos 48 - 121 IU/L 72 63 73  AST 0 - 40 IU/L 28 18 28   ALT 0 - 32 IU/L 29 20 40(H)    GFR: CrCl cannot be calculated (Patient's most recent lab result is older than the maximum 21 days allowed.). Liver Function Tests: No results for input(s): AST, ALT, ALKPHOS, BILITOT, PROT, ALBUMIN in the last 168 hours. No results for input(s): LIPASE, AMYLASE in the last 168 hours. No results for input(s): AMMONIA in the last 168 hours. Coagulation Profile: No results for input(s): INR, PROTIME in the last 168 hours. HbA1C: No results for input(s): HGBA1C in the last 72 hours. Lipid Profile: No results for input(s): CHOL, HDL, LDLCALC, TRIG, CHOLHDL, LDLDIRECT in the last 72 hours. Thyroid Function Tests: No results for input(s): TSH, T4TOTAL, FREET4, T3FREE, THYROIDAB in the last 72 hours. Anemia Panel: No results for input(s): VITAMINB12, FOLATE, FERRITIN, TIBC, IRON, RETICCTPCT in the last 72 hours.  No results found for this or any previous visit (from the past 240 hour(s)).    Radiology Studies: No results found.    Carmell Austria, MD 02/12/2020, 2:08 PM  Cc: Horald Pollen, *

## 2020-02-12 NOTE — Patient Instructions (Addendum)
If you are age 67 or older, your body mass index should be between 23-30. Your Body mass index is 30.93 kg/m. If this is out of the aforementioned range listed, please consider follow up with your Primary Care Provider.  If you are age 31 or younger, your body mass index should be between 19-25. Your Body mass index is 30.93 kg/m. If this is out of the aformentioned range listed, please consider follow up with your Primary Care Provider.   You have been scheduled for a colonoscopy. Please follow written instructions given to you at your visit today.  Please pick up your prep supplies at the pharmacy within the next 1-3 days. If you use inhalers (even only as needed), please bring them with you on the day of your procedure.  We have given you samples of the following medication to take: Clenpiq  Thank you,  Dr. Lynann Bologna

## 2020-02-13 ENCOUNTER — Ambulatory Visit (INDEPENDENT_AMBULATORY_CARE_PROVIDER_SITE_OTHER): Payer: Medicare Other | Admitting: Emergency Medicine

## 2020-02-13 ENCOUNTER — Encounter: Payer: Self-pay | Admitting: Emergency Medicine

## 2020-02-13 ENCOUNTER — Other Ambulatory Visit: Payer: Self-pay

## 2020-02-13 VITALS — BP 121/63 | HR 64 | Temp 98.0°F | Resp 16 | Ht 62.75 in | Wt 174.0 lb

## 2020-02-13 DIAGNOSIS — R7303 Prediabetes: Secondary | ICD-10-CM | POA: Diagnosis not present

## 2020-02-13 DIAGNOSIS — Z23 Encounter for immunization: Secondary | ICD-10-CM | POA: Diagnosis not present

## 2020-02-13 DIAGNOSIS — M159 Polyosteoarthritis, unspecified: Secondary | ICD-10-CM | POA: Diagnosis not present

## 2020-02-13 DIAGNOSIS — R079 Chest pain, unspecified: Secondary | ICD-10-CM | POA: Diagnosis not present

## 2020-02-13 DIAGNOSIS — E785 Hyperlipidemia, unspecified: Secondary | ICD-10-CM | POA: Diagnosis not present

## 2020-02-13 NOTE — Progress Notes (Signed)
Christine Hawkins 65 y.o.   Chief Complaint  Patient presents with  . Chest Pain    Per patient for one week on the left side and follow up chronic medical problem  . Anxiety    HISTORY OF PRESENT ILLNESS: This is a 65 y.o. female with history of dyslipidemia and osteoarthritis here for follow-up. Complaining of episodes of palpitation and sharp left-sided chest pain brought on by stress or periods of aggravation/sadness.  Episodes last several minutes and spontaneously resolve.  Not associated with any other symptoms.  Asymptomatic now. No other complaints or medical concerns. Not vaccinated against Covid.  HPI   Prior to Admission medications   Medication Sig Start Date End Date Taking? Authorizing Provider  Acetaminophen (TYLENOL PO) Take by mouth as needed.   Yes [provider]  aspirin EC 81 MG tablet Take 81 mg by mouth daily.   Yes [provider]  CVS COENZYME Q10 PO Take 100 mg by mouth.    Yes [provider]  cyclobenzaprine (FLEXERIL) 10 MG tablet Take 1 tablet (10 mg total) by mouth 2 (two) times daily as needed for muscle spasms. 12/17/18  Yes Dorie Rank, MD  loratadine (CLARITIN) 10 MG tablet Take 10 mg by mouth daily.   Yes [provider]  Multiple Vitamins-Minerals (CENTRUM SILVER 50+WOMEN) TABS Take by mouth daily.   Yes [provider]  mupirocin ointment (BACTROBAN) 2 % Sig to affected area twice a day x 7 days. 03/18/19  Yes Felisia Balcom, Ines Bloomer, MD  Omega-3 Fatty Acids (OMEGA 3 PO) Take by mouth daily.   Yes [provider]  rosuvastatin (CRESTOR) 20 MG tablet Take 1 tablet (20 mg total) by mouth daily. 03/18/19  Yes Pebbles Zeiders, Ines Bloomer, MD  triamcinolone (NASACORT) 55 MCG/ACT AERO nasal inhaler Place 2 sprays into the nose daily. 03/18/19  Yes Sabel Hornbeck, Ines Bloomer, MD  azelastine (OPTIVAR) 0.05 % ophthalmic solution Apply 1 drop to eye daily. 01/27/20   [provider]  ibuprofen (ADVIL) 400 MG tablet Take  1 tablet (400 mg total) by mouth every 8 (eight) hours as needed. Patient not taking: Reported on 02/13/2020 12/17/18   Dorie Rank, MD  PARoxetine (PAXIL) 20 MG tablet Take 2 tablets (40 mg total) by mouth daily. Patient taking differently: Take 20 mg by mouth daily. 03/18/19 06/16/19  Horald Pollen, MD    Allergies  Allergen Reactions  . Lactose Intolerance (Gi)   . Other     Dish/laundry dish detergent and bleach-smell bothers patient and dust  . Bupropion Nausea Only  . Niaspan [Niacin] Rash    Itching    Patient Active Problem List   Diagnosis Date Noted  . Chronic neck pain 11/07/2017  . Osteoarthritis involving multiple joints on both sides of body 12/01/2006  . HYPERLIPIDEMIA 05/12/2006  . COLONIC POLYPS, BENIGN, HX OF 05/12/2006    Past Medical History:  Diagnosis Date  . Anxiety   . Colon polyps   . Depression   . GERD (gastroesophageal reflux disease)   . High cholesterol   . Osteopenia     Past Surgical History:  Procedure Laterality Date  . BREAST LUMPECTOMY Left   . CERVICAL SPINE SURGERY      2013 has 8 screws   . CESAREAN SECTION    . COLONOSCOPY  08/26/2009   Normal colonoscopy up to cecum   . LEFT HEART CATHETERIZATION WITH CORONARY ANGIOGRAM N/A 06/07/2012   Procedure: LEFT HEART CATHETERIZATION WITH CORONARY ANGIOGRAM;  Surgeon: Cammy Brochure  Tomasita Crumble, MD;  Location: [facility] CATH LAB;  Service: Cardiovascular;  Laterality: N/A;    Social History   Socioeconomic History  . Marital status: Legally Separated    Spouse name: Not on file  . Number of children: 2  . Years of education: Not on file  . Highest education level: Not on file  Occupational History  . Occupation: retired  Tobacco Use  . Smoking status: Never Smoker  . Smokeless tobacco: Never Used  Vaping Use  . Vaping Use: Never used  Substance and Sexual Activity  . Alcohol use: Not Currently  . Drug use: Not Currently  . Sexual activity: Not Currently  Other Topics Concern  . Not on  file  Social History Narrative  . Not on file   Social Determinants of Health   Financial Resource Strain: Not on file  Food Insecurity: Not on file  Transportation Needs: Not on file  Physical Activity: Not on file  Stress: Not on file  Social Connections: Not on file  Intimate Partner Violence: Not on file    Family History  Problem Relation Age of Onset  . Heart disease Mother   . Heart disease Father   . Colon polyps Father   . Kidney disease Maternal Grandmother   . Stomach cancer Paternal Grandmother   . Diabetes Paternal Grandmother   . Esophageal cancer Neg Hx   . Colon cancer Neg Hx      Review of Systems  Constitutional: Negative.  Negative for chills and fever.  HENT: Negative.  Negative for congestion and sore throat.   Respiratory: Negative.  Negative for cough, sputum production, shortness of breath and wheezing.   Cardiovascular: Positive for chest pain and palpitations. Negative for leg swelling.  Gastrointestinal: Negative for nausea.  Genitourinary: Negative.  Negative for dysuria and hematuria.  Musculoskeletal: Positive for joint pain. Negative for myalgias.  Skin: Negative.  Negative for rash.  Neurological: Negative.  Negative for dizziness, sensory change, focal weakness and headaches.  All other systems reviewed and are negative.  Today's Vitals   02/13/20 1416  BP: 121/63  Pulse: 64  Resp: 16  Temp: 98 F (36.7 C)  TempSrc: Temporal  SpO2: 98%  Weight: 174 lb (78.9 kg)  Height: 5' 2.75" (1.594 m)   Body mass index is 31.07 kg/m.   Physical Exam Vitals reviewed.  Constitutional:      Appearance: She is well-developed.  HENT:     Head: Normocephalic.  Eyes:     Extraocular Movements: Extraocular movements intact.     Pupils: Pupils are equal, round, and reactive to light.  Cardiovascular:     Rate and Rhythm: Normal rate and regular rhythm.     Pulses: Normal pulses.     Heart sounds: Normal heart sounds.  Pulmonary:      Effort: Pulmonary effort is normal.     Breath sounds: Normal breath sounds.  Musculoskeletal:     Cervical back: Normal range of motion and neck supple.     Comments: Osteoarthritic changes of fingers  Skin:    General: Skin is warm and dry.     Capillary Refill: Capillary refill takes less than 2 seconds.  Neurological:     General: No focal deficit present.     Mental Status: She is alert and oriented to person, place, and time.  Psychiatric:        Mood and Affect: Mood normal.        Behavior: Behavior normal.  ASSESSMENT & PLAN: Christine Hawkins was seen today for chest pain and anxiety.  Diagnoses and all orders for this visit:  Nonspecific chest pain  Need for prophylactic vaccination and inoculation against influenza -     Flu Vaccine QUAD High Dose(Fluad)  Osteoarthritis involving multiple joints on both sides of body  Dyslipidemia  Prediabetes    Patient Instructions       If you have lab work done today you will be contacted with your lab results within the next 2 weeks.  If you have not heard from us then please contact us. The fastest way to get your results is to register for My Chart.   IF you received an x-ray today, you will receive an invoice from [facility]. Please contact [facility] at [phone] with questions or concerns regarding your invoice.   IF you received labwork today, you will receive an invoice from [facility]. Please contact [facility] at [phone] with questions or concerns regarding your invoice.   Our billing staff will not be able to assist you with questions regarding bills from these companies.  You will be contacted with the lab results as soon as they are available. The fastest way to get your results is to activate your My Chart account. Instructions are located on the last page of this paperwork. If you have not heard from us regarding the results in 2 weeks, please contact this office.     Dolor de pecho  inespecfico Nonspecific Chest Pain El dolor de pecho puede deberse a muchas afecciones diferentes. Algunas causas del dolor de pecho pueden ser potencialmente mortales. Estas requieren tratamiento inmediato. Algunas causas grave de dolor en el pecho son las siguientes:  Infarto de miocardio.  Un desgarro en el vaso sanguneo principal del cuerpo.  Enrojecimiento e hinchazn (inflamacin) alrededor del corazn.  Un cogulo de ConAgra Foodssangre en los pulmones. Otras causas de dolor en el pecho pueden no ser tan graves. Estos incluyen los siguientes:  Merchant navy officerAcidez estomacal.  Ansiedad o estrs.  Dao en los huesos o msculos del corazn.  Infecciones pulmonares. El dolor de pecho puede provocar las siguientes sensaciones:  Dolor o molestias en el pecho.  Dolor opresivo, continuo o constrictivo.  Ardor u hormigueo.  Dolor sordo o intenso que empeora al Clorox Companymoverse, toser o inhalar profundamente.  Dolor o Enbridge Energymolestias que tambin se sienten en la espalda, el cuello, la [place], el hombro o el brazo, o dolor que se extiende a cualquiera de estas zonas. Es difcil saber si la causa del dolor es algo grave o algo que no es tan grave. Por lo tanto, es importante que consulte al mdico inmediatamente si tiene Journalist, newspaperdolor en el pecho. Siga estas indicaciones en su casa: Medicamentos  Baxter Internationalome los medicamentos de venta libre y los recetados solamente como se lo haya indicado el mdico.  Si le recetaron un antibitico, tmelo como se lo haya indicado el mdico. No deje de tomar los antibiticos aunque comience a Actorsentirse mejor. Estilo de vida   Haga reposo como se lo haya indicado el mdico.  No consuma ningn producto que contenga nicotina o tabaco, como cigarrillos, cigarrillos electrnicos y tabaco de Theatre managermascar. Si necesita ayuda para dejar de fumar, consulte al mdico.  No beba alcohol.  Haga cambios en su estilo de vida segn las indicaciones del mdico. Estos pueden incluir lo siguiente: ? Practicar  actividad fsica con regularidad. Pregntele al mdico qu actividades son seguras para usted. ? Seguir una dieta cardiosaludable. Un especialista en dietas y alimentacin (nutricionista)  puede ayudarlo a que haga elecciones saludables. ? Mantener un peso saludable. ? Tratar la diabetes o la presin arterial alta, si es necesario. ? Reducir el estrs. Las [employer] yoga y las tcnicas de relajacin pueden ayudar. Indicaciones generales  Est atento a cualquier cambio en los sntomas. Informe a su mdico si presenta algn cambio en los sntomas o si aparecen sntomas nuevos.  Evite las [employer] causen dolor de [place].  Concurra a todas las visitas de seguimiento como se lo haya indicado el mdico. Esto es importante. Es posible que tenga que someterse a ms estudios si el dolor de pecho no desaparece. Comunquese con un mdico si:  El dolor de pecho no desaparece.  Se siente deprimido.  Tiene fiebre. Solicite ayuda inmediatamente si:  El dolor en el pecho es ms intenso.  Tiene tos que empeora o tose con [place].  Tiene dolor muy intenso en el vientre (abdomen).  Pierde el conocimiento (se desmaya).  Tiene cualquiera de estos sntomas sin ninguna causa clara: ? Tree surgeon repentino en el pecho. ? Molestias repentinas en los brazos, la espalda, el cuello o la [employer].  Le falta el aire en cualquier momento.  Comienza a sudar de [place] repentina o la piel se le humedece.  Siente malestar estomacal (nuseas).  Vomita.  Se siente repentinamente mareado o se desmaya.  Se siente muy dbil o cansado.  El corazn comienza a latirle rpidamente o parece que se saltea latidos. Estos sntomas pueden Sales executive. No espere a ver si los sntomas desaparecen. Solicite atencin mdica de inmediato. Comunquese con el servicio de emergencias de su localidad (911 en los [place]). No conduzca por sus propios medios [employer]. Resumen  El dolor  de pecho puede deberse a muchas afecciones diferentes. La causa puede ser grave y requerir tratamiento de inmediato. Si tiene dolor de pecho, consulte al mdico de inmediato.  Siga las indicaciones del mdico para tomar los medicamentos y Field seismologist cambios en su estilo de vida.  Concurra a todas las visitas de seguimiento como se lo haya indicado el mdico. Esto incluye las visitas para realizarle otros estudios si el dolor de pecho no desaparece.  Asegrese de [employer] signos que indican que su afeccin ha empeorado. Obtenga ayuda de inmediato si tiene esos sntomas. Esta informacin no tiene Marine scientist el consejo del mdico. Asegrese de hacerle al mdico cualquier pregunta que tenga. Document Revised: 09/25/2017 Document Reviewed: 09/25/2017 Elsevier Patient Education  2020 Elsevier Inc.      Agustina Caroli, MD [facility]

## 2020-02-13 NOTE — Patient Instructions (Addendum)
   If you have lab work done today you will be contacted with your lab results within the next 2 weeks.  If you have not heard from us then please contact us. The fastest way to get your results is to register for My Chart.   IF you received an x-ray today, you will receive an invoice from [facility]. Please contact [facility] at [phone] with questions or concerns regarding your invoice.   IF you received labwork today, you will receive an invoice from [facility]. Please contact [facility] at [phone] with questions or concerns regarding your invoice.   Our billing staff will not be able to assist you with questions regarding bills from these companies.  You will be contacted with the lab results as soon as they are available. The fastest way to get your results is to activate your My Chart account. Instructions are located on the last page of this paperwork. If you have not heard from us regarding the results in 2 weeks, please contact this office.     Dolor de pecho inespecfico Nonspecific Chest Pain El dolor de pecho puede deberse a muchas afecciones diferentes. Algunas causas del dolor de pecho pueden ser potencialmente mortales. Estas requieren tratamiento inmediato. Algunas causas grave de dolor en el pecho son las siguientes:  Infarto de miocardio.  Un desgarro en el vaso sanguneo principal del cuerpo.  Enrojecimiento e hinchazn (inflamacin) alrededor del corazn.  Un cogulo de sangre en los pulmones. Otras causas de dolor en el pecho pueden no ser tan graves. Estos incluyen los siguientes:  Acidez estomacal.  Ansiedad o estrs.  Dao en los huesos o msculos del corazn.  Infecciones pulmonares. El dolor de pecho puede provocar las siguientes sensaciones:  Dolor o molestias en el pecho.  Dolor opresivo, continuo o constrictivo.  Ardor u hormigueo.  Dolor sordo o intenso que empeora al moverse, toser o inhalar profundamente.  Dolor  o molestias que tambin se sienten en la espalda, el cuello, la mandbula, el hombro o el brazo, o dolor que se extiende a cualquiera de estas zonas. Es difcil saber si la causa del dolor es algo grave o algo que no es tan grave. Por lo tanto, es importante que consulte al mdico inmediatamente si tiene dolor en el pecho. Siga estas indicaciones en su casa: Medicamentos  Tome los medicamentos de venta libre y los recetados solamente como se lo haya indicado el mdico.  Si le recetaron un antibitico, tmelo como se lo haya indicado el mdico. No deje de tomar los antibiticos aunque comience a sentirse mejor. Estilo de vida   Haga reposo como se lo haya indicado el mdico.  No consuma ningn producto que contenga nicotina o tabaco, como cigarrillos, cigarrillos electrnicos y tabaco de mascar. Si necesita ayuda para dejar de fumar, consulte al mdico.  No beba alcohol.  Haga cambios en su estilo de vida segn las indicaciones del mdico. Estos pueden incluir lo siguiente: ? Practicar actividad fsica con regularidad. Pregntele al mdico qu actividades son seguras para usted. ? Seguir una dieta cardiosaludable. Un especialista en dietas y alimentacin (nutricionista) puede ayudarlo a que haga elecciones saludables. ? Mantener un peso saludable. ? Tratar la diabetes o la presin arterial alta, si es necesario. ? Reducir el estrs. Las actividades como el yoga y las tcnicas de relajacin pueden ayudar. Indicaciones generales  Est atento a cualquier cambio en los sntomas. Informe a su mdico si presenta algn cambio en los sntomas o si aparecen sntomas   nuevos.  Evite las actividades que le causen dolor de pecho.  Concurra a todas las visitas de seguimiento como se lo haya indicado el mdico. Esto es importante. Es posible que tenga que someterse a ms estudios si el dolor de pecho no desaparece. Comunquese con un mdico si:  El dolor de pecho no desaparece.  Se siente  deprimido.  Tiene fiebre. Solicite ayuda inmediatamente si:  El dolor en el pecho es ms intenso.  Tiene tos que empeora o tose con sangre.  Tiene dolor muy intenso en el vientre (abdomen).  Pierde el conocimiento (se desmaya).  Tiene cualquiera de estos sntomas sin ninguna causa clara: ? Malestar repentino en el pecho. ? Molestias repentinas en los brazos, la espalda, el cuello o la mandbula.  Le falta el aire en cualquier momento.  Comienza a sudar de manera repentina o la piel se le humedece.  Siente malestar estomacal (nuseas).  Vomita.  Se siente repentinamente mareado o se desmaya.  Se siente muy dbil o cansado.  El corazn comienza a latirle rpidamente o parece que se saltea latidos. Estos sntomas pueden indicar una emergencia. No espere a ver si los sntomas desaparecen. Solicite atencin mdica de inmediato. Comunquese con el servicio de emergencias de su localidad (911 en los [place]). No conduzca por sus propios medios hasta el hospital. Resumen  El dolor de pecho puede deberse a muchas afecciones diferentes. La causa puede ser grave y requerir tratamiento de inmediato. Si tiene dolor de pecho, consulte al mdico de inmediato.  Siga las indicaciones del mdico para tomar los medicamentos y hacer cambios en su estilo de vida.  Concurra a todas las visitas de seguimiento como se lo haya indicado el mdico. Esto incluye las visitas para realizarle otros estudios si el dolor de pecho no desaparece.  Asegrese de conocer los signos que indican que su afeccin ha empeorado. Obtenga ayuda de inmediato si tiene esos sntomas. Esta informacin no tiene como fin reemplazar el consejo del mdico. Asegrese de hacerle al mdico cualquier pregunta que tenga. Document Revised: 09/25/2017 Document Reviewed: 09/25/2017 Elsevier Patient Education  2020 Elsevier Inc.  

## 2020-03-11 ENCOUNTER — Other Ambulatory Visit: Payer: Self-pay | Admitting: *Deleted

## 2020-03-11 DIAGNOSIS — E785 Hyperlipidemia, unspecified: Secondary | ICD-10-CM

## 2020-03-15 ENCOUNTER — Other Ambulatory Visit: Payer: Self-pay | Admitting: Emergency Medicine

## 2020-03-15 DIAGNOSIS — E785 Hyperlipidemia, unspecified: Secondary | ICD-10-CM

## 2020-03-18 ENCOUNTER — Encounter: Payer: Self-pay | Admitting: Emergency Medicine

## 2020-03-18 ENCOUNTER — Other Ambulatory Visit: Payer: Self-pay

## 2020-03-18 ENCOUNTER — Ambulatory Visit (INDEPENDENT_AMBULATORY_CARE_PROVIDER_SITE_OTHER): Payer: Medicare Other | Admitting: Emergency Medicine

## 2020-03-18 VITALS — BP 146/68 | HR 59 | Temp 98.0°F | Resp 16 | Ht 62.75 in | Wt 169.0 lb

## 2020-03-18 DIAGNOSIS — E785 Hyperlipidemia, unspecified: Secondary | ICD-10-CM

## 2020-03-18 DIAGNOSIS — M159 Polyosteoarthritis, unspecified: Secondary | ICD-10-CM

## 2020-03-18 DIAGNOSIS — R7303 Prediabetes: Secondary | ICD-10-CM

## 2020-03-18 NOTE — Patient Instructions (Addendum)
If you have lab work done today you will be contacted with your lab results within the next 2 weeks.  If you have not heard from [place] then please contact us. The fastest way to get your results is to register for My Chart.   IF you received an x-ray today, you will receive an invoice from [facility]. Please contact [facility] at [phone] with questions or concerns regarding your invoice.   IF you received labwork today, you will receive an invoice from [place]. Please contact [facility] at [phone] with questions or concerns regarding your invoice.   Our billing staff will not be able to assist you with questions regarding bills from these companies.  You will be contacted with the lab results as soon as they are available. The fastest way to get your results is to activate your My Chart account. Instructions are located on the last page of this paperwork. If you have not heard from [place] regarding the results in 2 weeks, please contact this office.     Mantenimiento de la salud despus de los 29 aos de edad Health Maintenance After Age 20 Despus de los 65 aos de edad, corre un riesgo mayor de Tourist information centre manager enfermedades e infecciones a Barrister's clerk, como tambin de sufrir lesiones por cadas. Las cadas son la causa principal de las fracturas de huesos y lesiones en la cabeza de personas mayores de 102 aos de edad. Recibir cuidados preventivos de forma regular puede ayudarlo a mantenerse saludable y en buen [place]. Los cuidados preventivos incluyen realizarse anlisis de forma regular y Actor en el estilo de vida segn las recomendaciones del mdico. Converse con el profesional que lo asiste sobre:  Las pruebas de deteccin y los anlisis que debe Dispensing optician. Una prueba de deteccin es un estudio que se para Hydrographic surveyor la presencia de una enfermedad cuando no tiene sntomas.  Un plan de dieta y ejercicios adecuado para usted. Qu debo saber sobre las  pruebas de deteccin y los anlisis para prevenir cadas? Realizarse pruebas de deteccin y [employer] es la mejor manera de Hydrographic surveyor un problema de salud de forma temprana. El diagnstico y tratamiento tempranos le brindan la mejor oportunidad de Chief Technology Officer las afecciones mdicas que son comunes despus de los 83 aos de edad. Ciertas afecciones y elecciones de estilo de vida pueden hacer que sea ms propenso a sufrir Engineer, manufacturing. El mdico puede recomendarle lo siguiente:  Controles regulares de la visin. Una visin deficiente y afecciones como las cataratas pueden hacer que sea ms propenso a sufrir Engineer, manufacturing. Si [place] lentes, asegrese de obtener una receta actualizada si su visin cambia.  Revisin de medicamentos. Revise regularmente con el mdico todos los medicamentos que toma, incluidos los medicamentos de [place]. Consulte al [employer] efectos secundarios que pueden hacer que sea ms propenso a sufrir Engineer, manufacturing. Informe al mdico si alguno de los medicamentos que toma lo hace sentir mareado o somnoliento.  Pruebas de deteccin para la osteoporosis. La osteoporosis es una afeccin que hace que los huesos se vuelvan ms frgiles. En consecuencia, los huesos pueden debilitarse y quebrarse ms fcilmente.  Pruebas de deteccin para la presin arterial. Los cambios en la presin arterial y los medicamentos para Chief Technology Officer la presin arterial pueden hacerlo sentir mareado.  Controles de fuerza y equilibrio. El mdico puede recomendar ciertos estudios para controlar su fuerza y equilibrio al estar de pie, al caminar o al cambiar de posicin.  Examen de los pies.  El dolor y Chiropractor en los pies, como tambin no utilizar el calzado [place], pueden hacer que sea ms propenso a sufrir Engineer, manufacturing.  Prueba de deteccin de la depresin. Es ms probable que sufra una cada si tiene miedo a caerse, se siente mal emocionalmente o se siente incapaz de Stage manager.  Prueba de deteccin de consumo de alcohol. Beber demasiado alcohol puede afectar su equilibrio y puede hacer que sea ms propenso a sufrir Engineer, manufacturing. Qu medidas puedo tomar para reducir mi riesgo de sufrir una cada? Instrucciones generales  Hable con el mdico sobre sus riesgos de sufrir una cada. Infrmele a su mdico si: ? Se cae. Asegrese de informarle a su mdico acerca de todas las cadas, incluso aquellas que parecen ser [employer] ? Se siente mareado, somnoliento o que pierde el equilibrio.  Tome los medicamentos de venta libre y los recetados solamente como se lo haya indicado el mdico. Estos incluyen todos los suplementos.  Siga una dieta sana y [place] un peso saludable. Una dieta saludable incluye productos lcteos descremados, carnes bajas en contenido de grasa ([place], [place] de granos enteros, frijoles y [place] frutas y verduras. La seguridad en el hogar  Retire los objetos que puedan causar tropiezos tales como alfombras, cables u obstculos.  Instale equipos de seguridad, como barras para sostn en los baos y barandas de seguridad en las escaleras.  [place] habitaciones y los pasillos bien iluminados. Actividad  Siga un programa de ejercicio regular para mantenerse en forma. Esto lo ayudar a Contractor equilibrio. Consulte al mdico qu tipos de ejercicios son adecuados para usted.  Si necesita un bastn o un andador, selo segn las recomendaciones del mdico.  Utilice calzado con buen apoyo y suela antideslizante.   Estilo de vida  No beba alcohol si el mdico le indica que no beba.  Si bebe alcohol, limite la cantidad que consume: ? De 0 a 1 medida por da para las mujeres. ? De 0 a 2 medidas por da para los hombres.  Est atento a la cantidad de alcohol que contiene su bebida. En los [place] una medida equivale a una botella tpica de cerveza (12 onzas), media copa de vino (5 onzas) o una medida de bebida blanca (1 onza).  No consuma  ningn producto que contenga nicotina o tabaco, como cigarrillos y Psychologist, sport and exercise. Si necesita ayuda para dejar de fumar, consulte al mdico. Resumen  Tener un estilo de vida saludable y recibir cuidados preventivos pueden ayudar a Theatre stage manager salud y el bienestar despus de los 68 aos de [place].  Realizarse pruebas de deteccin y [employer] es la mejor manera de Hydrographic surveyor un problema de salud de forma temprana y Lourena Simmonds a Product/process development scientist una cada. El diagnstico y tratamiento tempranos le brindan la mejor oportunidad de Chief Technology Officer las afecciones mdicas ms comunes en las personas mayores de 40 aos de edad.  Las cadas son la causa principal de las fracturas de huesos y lesiones en la cabeza de personas mayores de 76 aos de edad. Tome precauciones para evitar una cada en su casa.  Trabaje con el mdico para saber qu cambios que puede hacer para mejorar su salud y [place], y [place]. Esta informacin no tiene Marine scientist el consejo del mdico. Asegrese de hacerle al mdico cualquier pregunta que tenga. Document Revised: 03/09/2017 Document Reviewed: 03/09/2017 Elsevier Patient Education  2021 [employer].

## 2020-03-18 NOTE — Progress Notes (Signed)
Mirna Laing 65 y.o.   Chief Complaint  Patient presents with  . Hyperlipidemia    Follow up cholesterol  . Sinus Problem    Per patient she did a home test for Covid and it was negative Patient has runny nose started a month ago    HISTORY OF PRESENT ILLNESS: This is a 65 y.o. female with history of dyslipidemia and prediabetes here for follow-up. Has history of diffuse osteoarthritis.  Today complaining of pain to right hand. Has history of chronic allergies with chronic sinus symptoms and chronic runny nose. No other complaints or medical concerns today.  HPI   Prior to Admission medications   Medication Sig Start Date End Date Taking? Authorizing Provider  Acetaminophen (TYLENOL PO) Take by mouth as needed.    [provider]  aspirin EC 81 MG tablet Take 81 mg by mouth daily.    [provider]  azelastine (OPTIVAR) 0.05 % ophthalmic solution Apply 1 drop to eye daily. 01/27/20   [provider]  [facility] COENZYME Q10 PO Take 100 mg by mouth.     [provider]  cyclobenzaprine (FLEXERIL) 10 MG tablet Take 1 tablet (10 mg total) by mouth 2 (two) times daily as needed for muscle spasms. 12/17/18   Dorie Rank, MD  loratadine (CLARITIN) 10 MG tablet Take 10 mg by mouth daily.    [provider]  Multiple Vitamins-Minerals (CENTRUM SILVER 50+WOMEN) TABS Take by mouth daily.    [provider]  mupirocin ointment (BACTROBAN) 2 % Sig to affected area twice a day x 7 days. 03/18/19   Horald Pollen, MD  Omega-3 Fatty Acids (OMEGA 3 PO) Take by mouth daily.    [provider]  PARoxetine (PAXIL) 20 MG tablet Take 2 tablets (40 mg total) by mouth daily. Patient taking differently: Take 20 mg by mouth daily. 03/18/19 06/16/19  Horald Pollen, MD  rosuvastatin (CRESTOR) 20 MG tablet TAKE 1 TABLET BY MOUTH EVERY DAY 03/15/20   Horald Pollen, MD  triamcinolone (NASACORT) 55 MCG/ACT AERO nasal inhaler Place 2 sprays into  the nose daily. 03/18/19   Horald Pollen, MD    Allergies  Allergen Reactions  . Lactose Intolerance (Gi)   . Other     Dish/laundry dish detergent and bleach-smell bothers patient and dust  . Bupropion Nausea Only  . Niaspan [Niacin] Rash    Itching    Patient Active Problem List   Diagnosis Date Noted  . Chronic neck pain 11/07/2017  . Osteoarthritis involving multiple joints on both sides of body 12/01/2006  . HYPERLIPIDEMIA 05/12/2006  . COLONIC POLYPS, BENIGN, HX OF 05/12/2006    Past Medical History:  Diagnosis Date  . Anxiety   . Colon polyps   . Depression   . GERD (gastroesophageal reflux disease)   . High cholesterol   . Osteopenia     Past Surgical History:  Procedure Laterality Date  . BREAST LUMPECTOMY Left   . CERVICAL SPINE SURGERY      2013 has 8 screws   . CESAREAN SECTION    . COLONOSCOPY  08/26/2009   Normal colonoscopy up to cecum   . LEFT HEART CATHETERIZATION WITH CORONARY ANGIOGRAM N/A 06/07/2012   Procedure: LEFT HEART CATHETERIZATION WITH CORONARY ANGIOGRAM;  Surgeon: Laverda Page, MD;  Location: [facility] CATH LAB;  Service: Cardiovascular;  Laterality: N/A;    Social History   Socioeconomic History  . Marital status: Legally Separated    Spouse name: Not  on file  . Number of children: 2  . Years of education: Not on file  . Highest education level: Not on file  Occupational History  . Occupation: retired  Tobacco Use  . Smoking status: Never Smoker  . Smokeless tobacco: Never Used  Vaping Use  . Vaping Use: Never used  Substance and Sexual Activity  . Alcohol use: Not Currently  . Drug use: Not Currently  . Sexual activity: Not Currently  Other Topics Concern  . Not on file  Social History Narrative  . Not on file   Social Determinants of Health   Financial Resource Strain: Not on file  Food Insecurity: Not on file  Transportation Needs: Not on file  Physical Activity: Not on file  Stress: Not on file  Social  Connections: Not on file  Intimate Partner Violence: Not on file    Family History  Problem Relation Age of Onset  . Heart disease Mother   . Heart disease Father   . Colon polyps Father   . Kidney disease Maternal Grandmother   . Stomach cancer Paternal Grandmother   . Diabetes Paternal Grandmother   . Esophageal cancer Neg Hx   . Colon cancer Neg Hx      Review of Systems  Constitutional: Positive for malaise/fatigue. Negative for chills and fever.  HENT: Negative.  Negative for congestion and sore throat.   Respiratory: Negative.  Negative for cough and shortness of breath.   Cardiovascular: Negative.  Negative for chest pain and palpitations.  Gastrointestinal: Negative.  Negative for abdominal pain, diarrhea, nausea and vomiting.  Genitourinary: Negative.  Negative for dysuria and hematuria.  Musculoskeletal: Positive for joint pain.  Skin: Negative.  Negative for rash.  Neurological: Negative.  Negative for dizziness and headaches.  Psychiatric/Behavioral: Negative.  Negative for depression and suicidal ideas.  All other systems reviewed and are negative.  Today's Vitals   03/18/20 1453  BP: (!) 146/68  Pulse: (!) 59  Resp: 16  Temp: 98 F (36.7 C)  TempSrc: Temporal  SpO2: 96%  Weight: 169 lb (76.7 kg)  Height: 5' 2.75" (1.594 m)   Body mass index is 30.18 kg/m.   Physical Exam Vitals reviewed.  Constitutional:      Appearance: Normal appearance.  HENT:     Head: Normocephalic.  Eyes:     Extraocular Movements: Extraocular movements intact.     Pupils: Pupils are equal, round, and reactive to light.  Cardiovascular:     Rate and Rhythm: Normal rate and regular rhythm.     Pulses: Normal pulses.     Heart sounds: Normal heart sounds.  Pulmonary:     Effort: Pulmonary effort is normal.     Breath sounds: Normal breath sounds.  Musculoskeletal:     Cervical back: Normal range of motion and neck supple.     Comments: Diffuse osteoarthritic  changes. Small ganglionic cyst to fourth and fifth metacarpal areas of right hand with tenderness to touch.  Skin:    General: Skin is warm and dry.     Capillary Refill: Capillary refill takes less than 2 seconds.  Neurological:     General: No focal deficit present.     Mental Status: She is alert and oriented to person, place, and time.  Psychiatric:        Mood and Affect: Mood normal.        Behavior: Behavior normal.    A total of 30 minutes was spent with the patient, greater than 50%  of which was in counseling/coordination of care regarding dyslipidemia and prediabetes and cardiovascular risks associated with these conditions, osteoarthritis and treatment of it, review of all medications, review of most recent office visit notes, review of most recent blood work results, health maintenance items, education on nutrition, prognosis, documentation, and need for follow-up.   ASSESSMENT & PLAN: Clinically stable.  No medical concerns identified during this visit. Continue present medications.  No changes. Follow-up in 3 months. Mirna was seen today for hyperlipidemia and sinus problem.  Diagnoses and all orders for this visit:  Dyslipidemia -     Comprehensive metabolic panel -     Lipid panel  Osteoarthritis involving multiple joints on both sides of body  Prediabetes -     Hemoglobin A1c    Patient Instructions       If you have lab work done today you will be contacted with your lab results within the next 2 weeks.  If you have not heard from [place] then please contact us. The fastest way to get your results is to register for My Chart.   IF you received an x-ray today, you will receive an invoice from [facility] Radiology. Please contact [facility] at [phone] with questions or concerns regarding your invoice.   IF you received labwork today, you will receive an invoice from [facility]. Please contact [facility] at [phone] with questions or concerns  regarding your invoice.   Our billing staff will not be able to assist you with questions regarding bills from these companies.  You will be contacted with the lab results as soon as they are available. The fastest way to get your results is to activate your My Chart account. Instructions are located on the last page of this paperwork. If you have not heard from [place] regarding the results in 2 weeks, please contact this office.     Mantenimiento de la salud despus de los 32 aos de edad Health Maintenance After Age 27 Despus de los 65 aos de edad, corre un riesgo mayor de Tourist information centre manager enfermedades e infecciones a Barrister's clerk, como tambin de sufrir lesiones por cadas. Las cadas son la causa principal de las fracturas de huesos y lesiones en la cabeza de personas mayores de 35 aos de edad. Recibir cuidados preventivos de forma regular puede ayudarlo a mantenerse saludable y en buen [place]. Los cuidados preventivos incluyen realizarse anlisis de forma regular y Actor en el estilo de vida segn las recomendaciones del mdico. Converse con el profesional que lo asiste sobre:  Las pruebas de deteccin y los anlisis que debe Dispensing optician. Una prueba de deteccin es un estudio que se para Hydrographic surveyor la presencia de una enfermedad cuando no tiene sntomas.  Un plan de dieta y ejercicios adecuado para usted. Qu debo saber sobre las pruebas de deteccin y los anlisis para prevenir cadas? Realizarse pruebas de deteccin y [employer] es la mejor manera de Hydrographic surveyor un problema de salud de forma temprana. El diagnstico y tratamiento tempranos le brindan la mejor oportunidad de Chief Technology Officer las afecciones mdicas que son comunes despus de los 37 aos de edad. Ciertas afecciones y elecciones de estilo de vida pueden hacer que sea ms propenso a sufrir Engineer, manufacturing. El mdico puede recomendarle lo siguiente:  Controles regulares de la visin. Una visin deficiente y afecciones como las cataratas  pueden hacer que sea ms propenso a sufrir Engineer, manufacturing. Si [place] lentes, asegrese de obtener una receta actualizada si su visin cambia.  Revisin de medicamentos. Revise  regularmente con el mdico todos los medicamentos que toma, incluidos los medicamentos de [place]. Consulte al [employer] efectos secundarios que pueden hacer que sea ms propenso a sufrir Engineer, manufacturing. Informe al mdico si alguno de los medicamentos que toma lo hace sentir mareado o somnoliento.  Pruebas de deteccin para la osteoporosis. La osteoporosis es una afeccin que hace que los huesos se vuelvan ms frgiles. En consecuencia, los huesos pueden debilitarse y quebrarse ms fcilmente.  Pruebas de deteccin para la presin arterial. Los cambios en la presin arterial y los medicamentos para Chief Technology Officer la presin arterial pueden hacerlo sentir mareado.  Controles de fuerza y equilibrio. El mdico puede recomendar ciertos estudios para controlar su fuerza y equilibrio al estar de pie, al caminar o al cambiar de posicin.  Examen de los pies. El dolor y Chiropractor en los pies, como tambin no utilizar el calzado [place], pueden hacer que sea ms propenso a sufrir Engineer, manufacturing.  Prueba de deteccin de la depresin. Es ms probable que sufra una cada si tiene miedo a caerse, se siente mal emocionalmente o se siente incapaz de Patent examiner.  Prueba de deteccin de consumo de alcohol. Beber demasiado alcohol puede afectar su equilibrio y puede hacer que sea ms propenso a sufrir Engineer, manufacturing. Qu medidas puedo tomar para reducir mi riesgo de sufrir una cada? Instrucciones generales  Hable con el mdico sobre sus riesgos de sufrir una cada. Infrmele a su mdico si: ? Se cae. Asegrese de informarle a su mdico acerca de todas las cadas, incluso aquellas que parecen ser [employer] ? Se siente mareado, somnoliento o que pierde el equilibrio.  Tome los medicamentos de venta libre y los recetados  solamente como se lo haya indicado el mdico. Estos incluyen todos los suplementos.  Siga una dieta sana y [place] un peso saludable. Una dieta saludable incluye productos lcteos descremados, carnes bajas en contenido de grasa ([place], [place] de granos enteros, frijoles y [place] frutas y verduras. La seguridad en el hogar  Retire los objetos que puedan causar tropiezos tales como alfombras, cables u obstculos.  Instale equipos de seguridad, como barras para sostn en los baos y barandas de seguridad en las escaleras.  [place] habitaciones y los pasillos bien iluminados. Actividad  Siga un programa de ejercicio regular para mantenerse en forma. Esto lo ayudar a Contractor equilibrio. Consulte al mdico qu tipos de ejercicios son adecuados para usted.  Si necesita un bastn o un andador, selo segn las recomendaciones del mdico.  Utilice calzado con buen apoyo y suela antideslizante.   Estilo de vida  No beba alcohol si el mdico le indica que no beba.  Si bebe alcohol, limite la cantidad que consume: ? De 0 a 1 medida por da para las mujeres. ? De 0 a 2 medidas por da para los hombres.  Est atento a la cantidad de alcohol que contiene su bebida. En los [place] una medida equivale a una botella tpica de cerveza (12 onzas), media copa de vino (5 onzas) o una medida de bebida blanca (1 onza).  No consuma ningn producto que contenga nicotina o tabaco, como cigarrillos y Psychologist, sport and exercise. Si necesita ayuda para dejar de fumar, consulte al mdico. Resumen  Tener un estilo de vida saludable y recibir cuidados preventivos pueden ayudar a Theatre stage manager salud y el bienestar despus de los 26 aos de [place].  Realizarse pruebas de deteccin y anlisis es la mejor manera de Hydrographic surveyor un problema  de salud de forma temprana y ayudarlo a Education officer, environmental cada. El diagnstico y tratamiento tempranos le brindan la mejor oportunidad de Chief Technology Officer las afecciones mdicas ms comunes en  las personas mayores de 78 aos de edad.  Las cadas son la causa principal de las fracturas de huesos y lesiones en la cabeza de personas mayores de 25 aos de edad. Tome precauciones para evitar una cada en su casa.  Trabaje con el mdico para saber qu cambios que puede hacer para mejorar su salud y [place], y [place]. Esta informacin no tiene Marine scientist el consejo del mdico. Asegrese de hacerle al mdico cualquier pregunta que tenga. Document Revised: 03/09/2017 Document Reviewed: 03/09/2017 Elsevier Patient Education  2021 Elsevier Inc.      Agustina Caroli, MD [facility]

## 2020-03-19 ENCOUNTER — Telehealth: Payer: Self-pay

## 2020-03-19 ENCOUNTER — Telehealth: Payer: Self-pay | Admitting: Emergency Medicine

## 2020-03-19 LAB — COMPREHENSIVE METABOLIC PANEL
ALT: 25 IU/L (ref 0–32)
AST: 27 IU/L (ref 0–40)
Albumin/Globulin Ratio: 2.1 (ref 1.2–2.2)
Albumin: 4.8 g/dL (ref 3.8–4.8)
Alkaline Phosphatase: 69 IU/L (ref 44–121)
BUN/Creatinine Ratio: 13 (ref 12–28)
BUN: 12 mg/dL (ref 8–27)
Bilirubin Total: 0.3 mg/dL (ref 0.0–1.2)
CO2: 25 mmol/L (ref 20–29)
Calcium: 9.9 mg/dL (ref 8.7–10.3)
Chloride: 101 mmol/L (ref 96–106)
Creatinine, Ser: 0.89 mg/dL (ref 0.57–1.00)
GFR calc Af Amer: 78 mL/min/{1.73_m2} (ref >59)
GFR calc non Af Amer: 68 mL/min/{1.73_m2} (ref >59)
Globulin, Total: 2.3 g/dL (ref 1.5–4.5)
Glucose: 90 mg/dL (ref 65–99)
Potassium: 4.4 mmol/L (ref 3.5–5.2)
Sodium: 141 mmol/L (ref 134–144)
Total Protein: 7.1 g/dL (ref 6.0–8.5)

## 2020-03-19 LAB — HEMOGLOBIN A1C
Est. average glucose Bld gHb Est-mCnc: 131 mg/dL
Hgb A1c MFr Bld: 6.2 % — ABNORMAL HIGH (ref 4.8–5.6)

## 2020-03-19 LAB — LIPID PANEL
Chol/HDL Ratio: 3.6 ratio (ref 0.0–4.4)
Cholesterol, Total: 217 mg/dL — ABNORMAL HIGH (ref 100–199)
HDL: 60 mg/dL (ref >39)
LDL Chol Calc (NIH): 134 mg/dL — ABNORMAL HIGH (ref 0–99)
Triglycerides: 132 mg/dL (ref 0–149)
VLDL Cholesterol Cal: 23 mg/dL (ref 5–40)

## 2020-03-19 NOTE — Telephone Encounter (Signed)
Pt calling back for lab results. Please advise.

## 2020-03-19 NOTE — Telephone Encounter (Signed)
LVM for patient to return call to inform her of her lab results and recommendations.

## 2020-03-20 ENCOUNTER — Other Ambulatory Visit: Payer: Self-pay | Admitting: Gastroenterology

## 2020-03-20 DIAGNOSIS — Z1159 Encounter for screening for other viral diseases: Secondary | ICD-10-CM | POA: Diagnosis not present

## 2020-03-20 LAB — SARS CORONAVIRUS 2 (TAT 6-24 HRS): SARS Coronavirus 2: NEGATIVE

## 2020-03-20 NOTE — Telephone Encounter (Signed)
Pt returning call to nurse. Please advise 

## 2020-03-20 NOTE — Telephone Encounter (Signed)
Returned call gave lab results pt understood and will f/u as scheduled

## 2020-03-23 DIAGNOSIS — Z1231 Encounter for screening mammogram for malignant neoplasm of breast: Secondary | ICD-10-CM | POA: Diagnosis not present

## 2020-03-23 LAB — HM MAMMOGRAPHY

## 2020-03-24 ENCOUNTER — Encounter: Payer: Self-pay | Admitting: Gastroenterology

## 2020-03-24 ENCOUNTER — Ambulatory Visit (AMBULATORY_SURGERY_CENTER): Payer: Medicare Other | Admitting: Gastroenterology

## 2020-03-24 ENCOUNTER — Other Ambulatory Visit: Payer: Self-pay

## 2020-03-24 VITALS — BP 114/56 | HR 54 | Temp 97.1°F | Resp 15 | Ht 62.0 in | Wt 173.0 lb

## 2020-03-24 DIAGNOSIS — K625 Hemorrhage of anus and rectum: Secondary | ICD-10-CM | POA: Diagnosis not present

## 2020-03-24 DIAGNOSIS — K621 Rectal polyp: Secondary | ICD-10-CM

## 2020-03-24 DIAGNOSIS — K648 Other hemorrhoids: Secondary | ICD-10-CM

## 2020-03-24 DIAGNOSIS — Z1211 Encounter for screening for malignant neoplasm of colon: Secondary | ICD-10-CM | POA: Diagnosis not present

## 2020-03-24 DIAGNOSIS — D128 Benign neoplasm of rectum: Secondary | ICD-10-CM

## 2020-03-24 MED ORDER — SODIUM CHLORIDE 0.9 % IV SOLN: 500.0000 mL | Freq: Once | INTRAVENOUS | Status: DC

## 2020-03-24 NOTE — Progress Notes (Signed)
A and O x3. Report to RN. Tolerated MAC anesthesia well.

## 2020-03-24 NOTE — Patient Instructions (Signed)
Handouts Provided:  Polyps  Use Preparation H 1 twice daily after the bowel movement for 7-10 days.   YOU HAD AN ENDOSCOPIC PROCEDURE TODAY AT THE [facility]:   Refer to the procedure report that was given to you for any specific questions about what was found during the examination.  If the procedure report does not answer your questions, please call your gastroenterologist to clarify.  If you requested that your care partner not be given the details of your procedure findings, then the procedure report has been included in a sealed envelope for you to review at your convenience later.  YOU SHOULD EXPECT: Some feelings of bloating in the abdomen. Passage of more gas than usual.  Walking can help get rid of the air that was put into your GI tract during the procedure and reduce the bloating. If you had a lower endoscopy (such as a colonoscopy or flexible sigmoidoscopy) you may notice spotting of blood in your stool or on the toilet paper. If you underwent a bowel prep for your procedure, you may not have a normal bowel movement for a few days.  Please Note:  You might notice some irritation and congestion in your nose or some drainage.  This is from the oxygen used during your procedure.  There is no need for concern and it should clear up in a day or so.  SYMPTOMS TO REPORT IMMEDIATELY:   Following lower endoscopy (colonoscopy or flexible sigmoidoscopy):  Excessive amounts of blood in the stool  Significant tenderness or worsening of abdominal pains  Swelling of the abdomen that is new, acute  Fever of 100F or higher  For urgent or emergent issues, a gastroenterologist can be reached at any hour by calling [phone]. Do not use MyChart messaging for urgent concerns.    DIET:  We do recommend a small meal at first, but then you may proceed to your regular diet.  Drink plenty of fluids but you should avoid alcoholic beverages for 24 hours.  ACTIVITY:  You should plan to  take it easy for the rest of today and you should NOT DRIVE or use heavy machinery until tomorrow (because of the sedation medicines used during the test).    FOLLOW UP: Our staff will call the number listed on your records 48-72 hours following your procedure to check on you and address any questions or concerns that you may have regarding the information given to you following your procedure. If we do not reach you, we will leave a message.  We will attempt to reach you two times.  During this call, we will ask if you have developed any symptoms of COVID 19. If you develop any symptoms (ie: fever, flu-like symptoms, shortness of breath, cough etc.) before then, please call [phone].  If you test positive for Covid 19 in the 2 weeks post procedure, please call and report this information to [place].    If any biopsies were taken you will be contacted by phone or by letter within the next 1-3 weeks.  Please call us at [phone] if you have not heard about the biopsies in 3 weeks.    SIGNATURES/CONFIDENTIALITY: You and/or your care partner have signed paperwork which will be entered into your electronic medical record.  These signatures attest to the fact that that the information above on your After Visit Summary has been reviewed and is understood.  Full responsibility of the confidentiality of this discharge information lies with you and/or your  care-partner.

## 2020-03-24 NOTE — Progress Notes (Signed)
Called to room to assist during endoscopic procedure.  Patient ID and intended procedure confirmed with present staff. Received instructions for my participation in the procedure from the performing physician.  

## 2020-03-24 NOTE — Op Note (Signed)
[place] Patient Name: Christine Hawkins Procedure Date: 03/24/2020 9:19 AM MRN: [id number] Endoscopist: Jackquline Denmark , MD Age: 65 Referring MD:  Date of Birth: [birth year] Gender: Female Account #: [id number] Procedure:                Colonoscopy Indications:              Rectal bleeding. H/O polyps. Medicines:                Monitored Anesthesia Care Procedure:                Pre-Anesthesia Assessment:                           - Prior to the procedure, a History and Physical                            was performed, and patient medications and                            allergies were reviewed. The patient's tolerance of                            previous anesthesia was also reviewed. The risks                            and benefits of the procedure and the sedation                            options and risks were discussed with the patient.                            All questions were answered, and informed consent                            was obtained. Prior Anticoagulants: The patient has                            taken no previous anticoagulant or antiplatelet                            agents. ASA Grade Assessment: II - A patient with                            mild systemic disease. After reviewing the risks                            and benefits, the patient was deemed in                            satisfactory condition to undergo the procedure.                           After obtaining informed consent, the colonoscope  was passed under direct vision. Throughout the                            procedure, the patient's blood pressure, pulse, and                            oxygen saturations were monitored continuously. The                            Olympus [id number]-[id number] (#[id number]) Colonoscope was                            introduced through the anus and advanced to the 2                            cm into the ileum. The colonoscopy was  performed                            without difficulty. The patient tolerated the                            procedure well. The quality of the bowel                            preparation was good. The terminal ileum, ileocecal                            valve, appendiceal orifice, and rectum were                            photographed. Scope In: 9:26:08 AM Scope Out: 9:38:39 AM Scope Withdrawal Time: 0 hours 9 minutes 49 seconds  Total Procedure Duration: 0 hours 12 minutes 31 seconds  Findings:                 Three sessile polyps were found in the rectum. The                            polyps were 2 to 4 mm in size. These polyps were                            removed with a cold biopsy forceps. Resection and                            retrieval were complete.                           Non-bleeding internal hemorrhoids were found during                            retroflexion. The hemorrhoids were small.                           The terminal ileum appeared normal.  The exam was otherwise without abnormality on                            direct and retroflexion views. Complications:            No immediate complications. Estimated Blood Loss:     Estimated blood loss: none. Impression:               - Three 2 to 4 mm polyps in the rectum, removed                            with a cold biopsy forceps. Resected and retrieved.                           - Non-bleeding internal hemorrhoids.                           - The examined portion of the ileum was normal.                           - The examination was otherwise normal on direct                            and retroflexion views. Recommendation:           - Patient has a contact number available for                            emergencies. The signs and symptoms of potential                            delayed complications were discussed with the                            patient. Return to normal  activities tomorrow.                            Written discharge instructions were provided to the                            patient.                           - Resume previous diet.                           - Continue present medications including MiraLAX 17                            g p.o. once a day.                           - Await pathology results.                           - Repeat colonoscopy for surveillance based on  pathology results.                           - Use Preparation H 1 twice daily after the bowel                            movement for 7 to 10 days.                           - Return to GI clinic PRN.                           - The findings and recommendations were discussed                            with the patient's family. Jackquline Denmark, MD 03/24/2020 9:43:05 AM This report has been signed electronically.

## 2020-03-26 ENCOUNTER — Telehealth: Payer: Self-pay

## 2020-03-26 NOTE — Telephone Encounter (Signed)
  Follow up Call-  Call back number 03/24/2020  Post procedure Call Back phone  # [phone], son  Permission to leave phone message Yes  Some recent data might be hidden     Patient questions:  Do you have a fever, pain , or abdominal swelling? No. Pain Score  0 *  Have you tolerated food without any problems? Yes.    Have you been able to return to your normal activities? Yes.    Do you have any questions about your discharge instructions: Diet   No. Medications  No. Follow up visit  No.  Do you have questions or concerns about your Care? No.  Actions: * If pain score is 4 or above: No action needed, pain <4.  1. Have you developed a fever since your procedure? no  2.   Have you had an respiratory symptoms (SOB or cough) since your procedure? no  3.   Have you tested positive for COVID 19 since your procedure no  4.   Have you had any family members/close contacts diagnosed with the COVID 19 since your procedure?  no   If yes to any of these questions please route to Joylene John, RN and Joella Prince, RN

## 2020-03-30 ENCOUNTER — Encounter: Payer: Self-pay | Admitting: Gastroenterology

## 2020-03-30 ENCOUNTER — Encounter: Payer: Self-pay | Admitting: *Deleted

## 2020-06-15 ENCOUNTER — Ambulatory Visit: Payer: Self-pay | Admitting: Emergency Medicine

## 2020-06-16 ENCOUNTER — Other Ambulatory Visit: Payer: Self-pay | Admitting: Emergency Medicine

## 2020-06-16 DIAGNOSIS — E785 Hyperlipidemia, unspecified: Secondary | ICD-10-CM

## 2020-06-18 ENCOUNTER — Ambulatory Visit: Payer: Medicare Other | Admitting: Emergency Medicine

## 2020-07-02 ENCOUNTER — Ambulatory Visit: Payer: Medicare Other | Admitting: Emergency Medicine

## 2020-07-07 ENCOUNTER — Ambulatory Visit (INDEPENDENT_AMBULATORY_CARE_PROVIDER_SITE_OTHER): Payer: Medicare Other | Admitting: Emergency Medicine

## 2020-07-07 ENCOUNTER — Encounter: Payer: Self-pay | Admitting: Emergency Medicine

## 2020-07-07 ENCOUNTER — Other Ambulatory Visit: Payer: Self-pay

## 2020-07-07 VITALS — BP 118/70 | HR 67 | Temp 98.5°F | Ht 62.0 in | Wt 165.2 lb

## 2020-07-07 DIAGNOSIS — R079 Chest pain, unspecified: Secondary | ICD-10-CM

## 2020-07-07 DIAGNOSIS — R7303 Prediabetes: Secondary | ICD-10-CM | POA: Diagnosis not present

## 2020-07-07 DIAGNOSIS — M65341 Trigger finger, right ring finger: Secondary | ICD-10-CM | POA: Insufficient documentation

## 2020-07-07 DIAGNOSIS — M8949 Other hypertrophic osteoarthropathy, multiple sites: Secondary | ICD-10-CM

## 2020-07-07 DIAGNOSIS — Z23 Encounter for immunization: Secondary | ICD-10-CM

## 2020-07-07 DIAGNOSIS — M159 Polyosteoarthritis, unspecified: Secondary | ICD-10-CM

## 2020-07-07 DIAGNOSIS — E785 Hyperlipidemia, unspecified: Secondary | ICD-10-CM

## 2020-07-07 LAB — COMPREHENSIVE METABOLIC PANEL
ALT: 17 U/L (ref 0–35)
AST: 19 U/L (ref 0–37)
Albumin: 4.5 g/dL (ref 3.5–5.2)
Alkaline Phosphatase: 60 U/L (ref 39–117)
BUN: 9 mg/dL (ref 6–23)
CO2: 31 mEq/L (ref 19–32)
Calcium: 9.8 mg/dL (ref 8.4–10.5)
Chloride: 104 mEq/L (ref 96–112)
Creatinine, Ser: 0.82 mg/dL (ref 0.40–1.20)
GFR: 74.19 mL/min (ref >60.00)
Glucose, Bld: 97 mg/dL (ref 70–99)
Potassium: 3.9 mEq/L (ref 3.5–5.1)
Sodium: 142 mEq/L (ref 135–145)
Total Bilirubin: 0.5 mg/dL (ref 0.2–1.2)
Total Protein: 7.2 g/dL (ref 6.0–8.3)

## 2020-07-07 LAB — LIPID PANEL
Cholesterol: 213 mg/dL — ABNORMAL HIGH (ref 0–200)
HDL: 59 mg/dL (ref >39.00)
LDL Cholesterol: 134 mg/dL — ABNORMAL HIGH (ref 0–99)
NonHDL: 154.01
Total CHOL/HDL Ratio: 4
Triglycerides: 98 mg/dL (ref 0.0–149.0)
VLDL: 19.6 mg/dL (ref 0.0–40.0)

## 2020-07-07 LAB — HEMOGLOBIN A1C: Hgb A1c MFr Bld: 6.3 % (ref 4.6–6.5)

## 2020-07-07 NOTE — Assessment & Plan Note (Signed)
Differential diagnosis discussed with patient.  Possible angina.  Ischemic heart disease needs to be ruled out.  Cardiology referral placed today.  Continue daily baby aspirin.  Continue rosuvastatin 20 mg daily.

## 2020-07-07 NOTE — Assessment & Plan Note (Signed)
Stable

## 2020-07-07 NOTE — Assessment & Plan Note (Signed)
Diet and nutrition discussed.  Fasting lipid profile done today.  Continue rosuvastatin 20 mg daily.

## 2020-07-07 NOTE — Progress Notes (Signed)
Christine Hawkins 65 y.o.   Chief Complaint  Patient presents with  . Follow-up    3 months    HISTORY OF PRESENT ILLNESS: This is a 65 y.o. female with history of prediabetes and dyslipidemia here for 53-month follow-up. Complaining of intermittent episodes of left-sided chest pain usually brought on by activity and relieved by rest. July 2006 patient had nuclear medicine myocardial scan: IMPRESSION:  1.  No evidence of pharmacologic-induced myocardial ischemia.   2.  Normal left ventricular wall motion study.  Calculated left ventricular ejection fraction of 65%.      Also complaining of pain to right hand for several weeks.  Unable to fully close right ring finger.  Denies injuries.  Recent colonoscopy done on 03/24/2020 reviewed with patient.  3 polyps in rectum and nonbleeding internal hemorrhoids.  Rest unremarkable.  HPI   Prior to Admission medications   Medication Sig Start Date End Date Taking? Authorizing Provider  Acetaminophen (TYLENOL PO) Take by mouth as needed.   Yes [provider]  aspirin EC 81 MG tablet Take 81 mg by mouth daily.   Yes [provider]  azelastine (OPTIVAR) 0.05 % ophthalmic solution Apply 1 drop to eye daily. 01/27/20  Yes [provider]  [facility] COENZYME Q10 PO Take 100 mg by mouth.    Yes [provider]  cyclobenzaprine (FLEXERIL) 10 MG tablet Take 1 tablet (10 mg total) by mouth 2 (two) times daily as needed for muscle spasms. 12/17/18  Yes Dorie Rank, MD  loratadine (CLARITIN) 10 MG tablet Take 10 mg by mouth daily.   Yes [provider]  Multiple Vitamins-Minerals (CENTRUM SILVER 50+WOMEN) TABS Take by mouth daily.   Yes [provider]  mupirocin ointment (BACTROBAN) 2 % Sig to affected area twice a day x 7 days. 03/18/19  Yes Siyah Mault, Ines Bloomer, MD  Omega-3 Fatty Acids (OMEGA 3 PO) Take by mouth daily.   Yes [provider]  rosuvastatin (CRESTOR) 20 MG tablet TAKE 1 TABLET BY MOUTH  EVERY DAY 06/16/20  Yes Cleotilde Spadaccini, Ines Bloomer, MD  triamcinolone (NASACORT) 55 MCG/ACT AERO nasal inhaler Place 2 sprays into the nose daily. 03/18/19  Yes Eron Goble, Ines Bloomer, MD  PARoxetine (PAXIL) 20 MG tablet Take 2 tablets (40 mg total) by mouth daily. Patient taking differently: Take 20 mg by mouth daily. 03/18/19 06/16/19  Horald Pollen, MD    Allergies  Allergen Reactions  . Lactose Intolerance (Gi)   . Other     Dish/laundry dish detergent and bleach-smell bothers patient and dust  . Bupropion Nausea Only  . Niaspan [Niacin] Rash    Itching    Patient Active Problem List   Diagnosis Date Noted  . Chronic neck pain 11/07/2017  . Osteoarthritis involving multiple joints on both sides of body 12/01/2006  . HYPERLIPIDEMIA 05/12/2006  . COLONIC POLYPS, BENIGN, HX OF 05/12/2006    Past Medical History:  Diagnosis Date  . Anxiety   . Colon polyps   . Depression   . GERD (gastroesophageal reflux disease)   . High cholesterol   . Osteopenia     Past Surgical History:  Procedure Laterality Date  . BREAST LUMPECTOMY Left   . CERVICAL SPINE SURGERY      2013 has 8 screws   . CESAREAN SECTION    . COLONOSCOPY  08/26/2009   Normal colonoscopy up to cecum   . LEFT HEART CATHETERIZATION WITH CORONARY ANGIOGRAM N/A 06/07/2012   Procedure: LEFT HEART CATHETERIZATION WITH CORONARY  ANGIOGRAM;  Surgeon: Laverda Page, MD;  Location: [facility] CATH LAB;  Service: Cardiovascular;  Laterality: N/A;    Social History   Socioeconomic History  . Marital status: Legally Separated    Spouse name: Not on file  . Number of children: 2  . Years of education: Not on file  . Highest education level: Not on file  Occupational History  . Occupation: retired  Tobacco Use  . Smoking status: Never Smoker  . Smokeless tobacco: Never Used  Vaping Use  . Vaping Use: Never used  Substance and Sexual Activity  . Alcohol use: Not Currently  . Drug use: Not Currently  . Sexual activity: Not  Currently  Other Topics Concern  . Not on file  Social History Narrative  . Not on file   Social Determinants of Health   Financial Resource Strain: Not on file  Food Insecurity: Not on file  Transportation Needs: Not on file  Physical Activity: Not on file  Stress: Not on file  Social Connections: Not on file  Intimate Partner Violence: Not on file    Family History  Problem Relation Age of Onset  . Heart disease Mother   . Heart disease Father   . Colon polyps Father   . Kidney disease Maternal Grandmother   . Stomach cancer Paternal Grandmother   . Diabetes Paternal Grandmother   . Esophageal cancer Neg Hx   . Colon cancer Neg Hx   . Rectal cancer Neg Hx      Review of Systems  Constitutional: Negative.  Negative for chills and fever.  HENT: Negative.  Negative for congestion and sore throat.   Respiratory: Negative.  Negative for cough and shortness of breath.   Cardiovascular: Positive for chest pain. Negative for palpitations.  Gastrointestinal: Negative for abdominal pain, diarrhea, nausea and vomiting.  Genitourinary: Negative.  Negative for dysuria and hematuria.  Skin: Negative.  Negative for rash.  Neurological: Negative.  Negative for dizziness and headaches.  All other systems reviewed and are negative.     Today's Vitals   07/07/20 1303  BP: 118/70  Pulse: 67  Temp: 98.5 F (36.9 C)  TempSrc: Oral  SpO2: 98%  Weight: 165 lb 3.2 oz (74.9 kg)  Height: 5\' 2"  (1.575 m)   Body mass index is 30.22 kg/m. Wt Readings from Last 3 Encounters:  07/07/20 165 lb 3.2 oz (74.9 kg)  03/24/20 173 lb (78.5 kg)  03/18/20 169 lb (76.7 kg)    Physical Exam Vitals reviewed.  Constitutional:      Appearance: Normal appearance.  HENT:     Head: Normocephalic.  Eyes:     Extraocular Movements: Extraocular movements intact.     Conjunctiva/sclera: Conjunctivae normal.     Pupils: Pupils are equal, round, and reactive to light.  Cardiovascular:     Rate  and Rhythm: Normal rate and regular rhythm.     Pulses: Normal pulses.     Heart sounds: Normal heart sounds.  Pulmonary:     Effort: Pulmonary effort is normal.     Breath sounds: Normal breath sounds.  Musculoskeletal:        General: Normal range of motion.     Cervical back: Normal range of motion and neck supple.     Comments: Right hand: Positive swelling and tenderness of the distal flexor tendon fourth metacarpal.  Unable to fully flex finger  Skin:    General: Skin is warm and dry.     Capillary Refill: Capillary refill  takes less than 2 seconds.  Neurological:     General: No focal deficit present.     Mental Status: She is alert and oriented to person, place, and time.  Psychiatric:        Mood and Affect: Mood normal.        Behavior: Behavior normal.   EKG: Normal sinus rhythm with ventricular rate of 56/min.  No acute ischemic changes.  No available old EKG to compare with. ASSESSMENT & PLAN: A total of 45 minutes was spent with the patient and counseling/coordination of care regarding differential diagnosis of chest pain including possibility of angina and ischemic heart disease, need for cardiology evaluation, review of nuclear medicine cardiac scan done in 2006, review of today's EKG, review of most recent office visit notes, review of most recent blood work results, review of all medications and need to continue daily baby aspirin and rosuvastatin, differential diagnosis of right hand pain and need for hand surgery evaluation for flexor tendon pathology, education on nutrition, prognosis, documentation and need for follow-up.  Nonspecific chest pain Differential diagnosis discussed with patient.  Possible angina.  Ischemic heart disease needs to be ruled out.  Cardiology referral placed today.  Continue daily baby aspirin.  Continue rosuvastatin 20 mg daily.  Primary osteoarthritis involving multiple joints Stable.  Trigger ring finger of right hand Needs orthopedic  evaluation.  Flexor tendon pathology.  Prediabetes Diet and nutrition discussed.  Blood work done today while fasting.  Dyslipidemia Diet and nutrition discussed.  Fasting lipid profile done today.  Continue rosuvastatin 20 mg daily.  Christine was seen today for follow-up.  Diagnoses and all orders for this visit:  Nonspecific chest pain -     Ambulatory referral to Cardiology -     Comprehensive metabolic panel  Prediabetes -     POCT glycosylated hemoglobin (Hb A1C) -     Comprehensive metabolic panel -     Hemoglobin A1c  Dyslipidemia -     Comprehensive metabolic panel -     Lipid panel  Primary osteoarthritis involving multiple joints  Need for shingles vaccine -     Varicella-zoster vaccine IM  Trigger ring finger of right hand -     Ambulatory referral to Orthopedic Surgery    Patient Instructions   Dolor de pecho inespecfico Nonspecific Chest Pain El dolor de pecho puede deberse a muchas afecciones diferentes. Algunas causas del dolor de pecho pueden ser potencialmente mortales. Estas requieren tratamiento inmediato. Algunas causas grave de dolor en el pecho son las siguientes:  Infarto de miocardio.  Un desgarro en el vaso sanguneo principal del cuerpo.  Enrojecimiento e hinchazn (inflamacin) alrededor del corazn.  Un cogulo de [employer]. Otras causas de dolor en el pecho pueden no ser tan graves. Estos incluyen los siguientes:  Merchant navy officer.  Ansiedad o estrs.  Dao en [place].  Infecciones pulmonares. El dolor de pecho puede provocar las siguientes sensaciones:  Dolor o molestias en el pecho.  Dolor opresivo, continuo o constrictivo.  Ardor u hormigueo.  Dolor sordo o intenso que empeora al [employer], toser o inhalar profundamente.  Dolor o [employer] tambin se sienten en la espalda, el cuello, la [place], el hombro o el brazo, o dolor que se extiende a cualquiera de estas zonas. Es  difcil saber si la causa del dolor es algo grave o algo que no es tan grave. Por lo tanto, es importante que consulte al mdico inmediatamente si tiene  dolor en el pecho. Siga estas indicaciones en su casa: Medicamentos  [employer] de venta libre y los recetados solamente como se lo haya indicado el mdico.  Si le recetaron un antibitico, tmelo como se lo haya indicado el mdico. No deje de tomar los antibiticos aunque comience a Sports administrator. Estilo de vida  Haga reposo como se lo haya indicado el mdico.  No consuma ningn producto que contenga nicotina o tabaco, como cigarrillos, cigarrillos electrnicos y tabaco de Higher education careers adviser. Si necesita ayuda para dejar de fumar, consulte al mdico.  No beba alcohol.  Haga cambios en su estilo de vida segn las indicaciones del mdico. Estos pueden incluir lo siguiente: ? Practicar actividad fsica con regularidad. Pregntele al mdico qu actividades son seguras para usted. ? Seguir una dieta cardiosaludable. Un especialista en dietas y alimentacin (nutricionista) puede ayudarlo a que haga elecciones saludables. ? Mantener un peso saludable. ? Tratar la diabetes o la presin arterial alta, si es necesario. ? Reducir el estrs. Las [employer] yoga y las tcnicas de relajacin pueden ayudar.   Indicaciones generales  Est atento a cualquier cambio en los sntomas. Informe a su mdico si presenta algn cambio en los sntomas o si aparecen sntomas nuevos.  Evite las [employer] causen dolor de [place].  Concurra a todas las visitas de seguimiento como se lo haya indicado el mdico. Esto es importante. Es posible que tenga que someterse a ms estudios si el dolor de pecho no desaparece. Comunquese con un mdico si:  El dolor de pecho no desaparece.  Se siente deprimido.  Tiene fiebre. Solicite ayuda inmediatamente si:  El dolor en el pecho es ms intenso.  Tiene tos que empeora o tose con [place].  Tiene dolor  muy intenso en el vientre (abdomen).  Pierde el conocimiento (se desmaya).  Tiene cualquiera de estos sntomas sin ninguna causa clara: ? Tree surgeon repentino en el pecho. ? Molestias repentinas en los brazos, la espalda, el cuello o la [place].  Le falta el aire en cualquier momento.  Comienza a sudar de [place] repentina o la piel se le humedece.  Siente malestar estomacal (nuseas).  Vomita.  Se siente repentinamente mareado o se desmaya.  Se siente muy dbil o cansado.  El corazn comienza a latirle rpidamente o parece que se saltea latidos. Estos sntomas pueden Sales executive. No espere a ver si los sntomas desaparecen. Solicite atencin mdica de inmediato. Comunquese con el servicio de emergencias de su localidad (911 en los [place]). No conduzca por sus propios medios [employer]. Resumen  El dolor de pecho puede deberse a muchas afecciones diferentes. La causa puede ser grave y requerir tratamiento de inmediato. Si tiene dolor de pecho, consulte al mdico de inmediato.  Siga las indicaciones del mdico para tomar los medicamentos y Field seismologist cambios en su estilo de vida.  Concurra a todas las visitas de seguimiento como se lo haya indicado el mdico. Esto incluye las visitas para realizarle otros estudios si el dolor de pecho no desaparece.  Asegrese de [employer] signos que indican que su afeccin ha empeorado. Obtenga ayuda de inmediato si tiene esos sntomas. Esta informacin no tiene Marine scientist el consejo del mdico. Asegrese de hacerle al mdico cualquier pregunta que tenga. Document Revised: 09/25/2017 Document Reviewed: 09/25/2017 Elsevier Patient Education  2021 [place], MD [facility]

## 2020-07-07 NOTE — Assessment & Plan Note (Signed)
Needs orthopedic evaluation.  Flexor tendon pathology.

## 2020-07-07 NOTE — Patient Instructions (Addendum)
Dolor de pecho inespecfico Nonspecific Chest Pain El dolor de pecho puede deberse a muchas afecciones diferentes. Algunas causas del dolor de pecho pueden ser potencialmente mortales. Estas requieren tratamiento inmediato. Algunas causas grave de dolor en el pecho son las siguientes:  Infarto de miocardio.  Un desgarro en el vaso sanguneo principal del cuerpo.  Enrojecimiento e hinchazn (inflamacin) alrededor del corazn.  Un cogulo de [employer]. Otras causas de dolor en el pecho pueden no ser tan graves. Estos incluyen los siguientes:  Merchant navy officer.  Ansiedad o estrs.  Dao en [place].  Infecciones pulmonares. El dolor de pecho puede provocar las siguientes sensaciones:  Dolor o molestias en el pecho.  Dolor opresivo, continuo o constrictivo.  Ardor u hormigueo.  Dolor sordo o intenso que empeora al [employer], toser o inhalar profundamente.  Dolor o [employer] tambin se sienten en la espalda, el cuello, la [place], el hombro o el brazo, o dolor que se extiende a cualquiera de estas zonas. Es difcil saber si la causa del dolor es algo grave o algo que no es tan grave. Por lo tanto, es importante que consulte al mdico inmediatamente si tiene Tourist information centre manager. Siga estas indicaciones en su casa: Medicamentos  [employer] de venta libre y los recetados solamente como se lo haya indicado el mdico.  Si le recetaron un antibitico, tmelo como se lo haya indicado el mdico. No deje de tomar los antibiticos aunque comience a Sports administrator. Estilo de vida  Haga reposo como se lo haya indicado el mdico.  No consuma ningn producto que contenga nicotina o tabaco, como cigarrillos, cigarrillos electrnicos y tabaco de Higher education careers adviser. Si necesita ayuda para dejar de fumar, consulte al mdico.  No beba alcohol.  Haga cambios en su estilo de vida segn las indicaciones del mdico. Estos pueden incluir lo  siguiente: ? Practicar actividad fsica con regularidad. Pregntele al mdico qu actividades son seguras para usted. ? Seguir una dieta cardiosaludable. Un especialista en dietas y alimentacin (nutricionista) puede ayudarlo a que haga elecciones saludables. ? Mantener un peso saludable. ? Tratar la diabetes o la presin arterial alta, si es necesario. ? Reducir el estrs. Las [employer] yoga y las tcnicas de relajacin pueden ayudar.   Indicaciones generales  Est atento a cualquier cambio en los sntomas. Informe a su mdico si presenta algn cambio en los sntomas o si aparecen sntomas nuevos.  Evite las [employer] causen dolor de [place].  Concurra a todas las visitas de seguimiento como se lo haya indicado el mdico. Esto es importante. Es posible que tenga que someterse a ms estudios si el dolor de pecho no desaparece. Comunquese con un mdico si:  El dolor de pecho no desaparece.  Se siente deprimido.  Tiene fiebre. Solicite ayuda inmediatamente si:  El dolor en el pecho es ms intenso.  Tiene tos que empeora o tose con [place].  Tiene dolor muy intenso en el vientre (abdomen).  Pierde el conocimiento (se desmaya).  Tiene cualquiera de estos sntomas sin ninguna causa clara: ? Tree surgeon repentino en el pecho. ? Molestias repentinas en los brazos, la espalda, el cuello o la [place].  Le falta el aire en cualquier momento.  Comienza a sudar de [place] repentina o la piel se le humedece.  Siente malestar estomacal (nuseas).  Vomita.  Se siente repentinamente mareado o se desmaya.  Se siente muy dbil o cansado.  El corazn comienza a latirle rpidamente o  parece que se saltea latidos. Estos sntomas pueden Sales executive. No espere a ver si los sntomas desaparecen. Solicite atencin mdica de inmediato. Comunquese con el servicio de emergencias de su localidad (911 en los [place]). No conduzca por sus propios medios [employer]. Resumen  El dolor de pecho puede deberse a muchas afecciones diferentes. La causa puede ser grave y requerir tratamiento de inmediato. Si tiene dolor de pecho, consulte al mdico de inmediato.  Siga las indicaciones del mdico para tomar los medicamentos y Field seismologist cambios en su estilo de vida.  Concurra a todas las visitas de seguimiento como se lo haya indicado el mdico. Esto incluye las visitas para realizarle otros estudios si el dolor de pecho no desaparece.  Asegrese de [employer] signos que indican que su afeccin ha empeorado. Obtenga ayuda de inmediato si tiene esos sntomas. Esta informacin no tiene Marine scientist el consejo del mdico. Asegrese de hacerle al mdico cualquier pregunta que tenga. Document Revised: 09/25/2017 Document Reviewed: 09/25/2017 Elsevier Patient Education  [place].

## 2020-07-07 NOTE — Assessment & Plan Note (Signed)
Diet and nutrition discussed.  Blood work done today while fasting.

## 2020-07-08 NOTE — Addendum Note (Signed)
Addended by: Alfredia Ferguson A on: 07/08/2020 01:37 PM   Modules accepted: Orders

## 2020-07-09 ENCOUNTER — Telehealth: Payer: Self-pay | Admitting: Emergency Medicine

## 2020-07-09 NOTE — Telephone Encounter (Signed)
Patient returned call in regards to lab results

## 2020-07-10 NOTE — Telephone Encounter (Signed)
On July 09, 2020 at 11:55 am contacted patient with lab results.

## 2020-07-22 ENCOUNTER — Encounter: Payer: Self-pay | Admitting: Family Medicine

## 2020-07-22 ENCOUNTER — Other Ambulatory Visit: Payer: Self-pay

## 2020-07-22 ENCOUNTER — Ambulatory Visit (INDEPENDENT_AMBULATORY_CARE_PROVIDER_SITE_OTHER): Payer: Medicare Other | Admitting: Family Medicine

## 2020-07-22 DIAGNOSIS — M65331 Trigger finger, right middle finger: Secondary | ICD-10-CM

## 2020-07-22 NOTE — Progress Notes (Signed)
Office Visit Note   Patient: Christine Hawkins           Date of Birth: 09/08/1955           MRN: [id number] Visit Date: 07/22/2020 Requested by: Horald Pollen, [place],  [place] [address] PCP: Horald Pollen, MD  Subjective: Chief Complaint  Patient presents with   Right Hand - Pain    Trigger finger right hand, ring finger x 3 months. Pain into the little finger and palm of the hand. NKI. Right-hand dominant.     HPI: She is here with right fourth trigger finger.  Symptoms started about 3 months ago, no injury.  Gradual onset of pain and inability to fully flex her finger.  She has diabetes.  She is right-hand dominant.  No previous problems with her hand.  She has not tried anything specific to eliminate her symptoms.  Interpreter was present today.                ROS:   All other systems were reviewed and are negative.  Objective: Vital Signs: There were no vitals taken for this visit.  Physical Exam:  General:  Alert and oriented, in no acute distress. Pulm:  Breathing unlabored. Psy:  Normal mood, congruent affect. Skin: No erythema Right hand: She has a tender nodule at the fourth finger A1 pulley.  She is unable to actively fully flex her finger because of pain.  Full extension.   Imaging: No results found.  Assessment & Plan: Right fourth trigger finger  - We discussed conservative options including splinting, physical therapy, injection.  She is very reluctant to try any of these and would prefer surgical consultation.  I will have her come back to see Dr. Erlinda Hong in the near future.     Procedures: No procedures performed        PMFS History: Patient Active Problem List   Diagnosis Date Noted   Nonspecific chest pain 07/07/2020   Prediabetes 07/07/2020   Trigger ring finger of right hand 07/07/2020   Chronic neck pain 11/07/2017   Primary osteoarthritis involving multiple joints 12/01/2006   Dyslipidemia 05/12/2006    COLONIC POLYPS, BENIGN, HX OF 05/12/2006   Past Medical History:  Diagnosis Date   Anxiety    Colon polyps    Depression    GERD (gastroesophageal reflux disease)    High cholesterol    Osteopenia     Family History  Problem Relation Age of Onset   Heart disease Mother    Heart disease Father    Colon polyps Father    Kidney disease Maternal Grandmother    Stomach cancer Paternal Grandmother    Diabetes Paternal Grandmother    Esophageal cancer Neg Hx    Colon cancer Neg Hx    Rectal cancer Neg Hx     Past Surgical History:  Procedure Laterality Date   BREAST LUMPECTOMY Left    CERVICAL SPINE SURGERY      2013 has 8 screws    CESAREAN SECTION     COLONOSCOPY  08/26/2009   Normal colonoscopy up to cecum    LEFT HEART CATHETERIZATION WITH CORONARY ANGIOGRAM N/A 06/07/2012   Procedure: LEFT HEART CATHETERIZATION WITH CORONARY ANGIOGRAM;  Surgeon: Laverda Page, MD;  Location: [facility] C[facility] LAB;  Service: Cardiovascular;  Laterality: N/A;   Social History   Occupational History   Occupation: retired  Tobacco Use   Smoking status: Never   Smokeless tobacco: Never  Vaping Use   Vaping Use: Never used  Substance and Sexual Activity   Alcohol use: Not Currently   Drug use: Not Currently   Sexual activity: Not Currently

## 2020-07-28 ENCOUNTER — Ambulatory Visit (INDEPENDENT_AMBULATORY_CARE_PROVIDER_SITE_OTHER): Payer: Medicare Other | Admitting: Orthopaedic Surgery

## 2020-07-28 ENCOUNTER — Encounter: Payer: Self-pay | Admitting: Orthopaedic Surgery

## 2020-07-28 VITALS — Ht 62.0 in | Wt 165.0 lb

## 2020-07-28 DIAGNOSIS — M65331 Trigger finger, right middle finger: Secondary | ICD-10-CM | POA: Diagnosis not present

## 2020-07-28 MED ORDER — METHYLPREDNISOLONE ACETATE 40 MG/ML IJ SUSP
13.3300 mg | INTRAMUSCULAR | Status: AC | PRN
  Administered 2020-07-28: 13.33 mg

## 2020-07-28 MED ORDER — BUPIVACAINE HCL 0.5 % IJ SOLN
0.3300 mL | INTRAMUSCULAR | Status: AC | PRN
  Administered 2020-07-28: .33 mL

## 2020-07-28 MED ORDER — LIDOCAINE HCL 1 % IJ SOLN
0.3000 mL | INTRAMUSCULAR | Status: AC | PRN
  Administered 2020-07-28: .3 mL

## 2020-07-28 NOTE — Progress Notes (Signed)
Office Visit Note   Patient: Christine Hawkins           Date of Birth: 11-16-1955           MRN: [id number] Visit Date: 07/28/2020              Requested by: Horald Pollen, MD [place],  [place] [id number] PCP: Horald Pollen, MD   Assessment & Plan: Visit Diagnoses:  1. Trigger finger, right middle finger     Plan: Impression is right ring finger stenosing tenosynovitis.  I have recommended cortisone injection as initial line of treatment as this can be very beneficial and sometimes curative.  She is in agreement and tolerated the injection well today.  We will see her back as needed.  Interpreter was present for the entire encounter.  Follow-Up Instructions: Return if symptoms worsen or fail to improve.   Orders:  No orders of the defined types were placed in this encounter.  No orders of the defined types were placed in this encounter.     Procedures: Hand/UE Inj: R ring A1 for trigger finger on 07/28/2020 1:51 PM Indications: pain Details: 25 G needle Medications: 0.3 mL lidocaine 1 %; 0.33 mL bupivacaine 0.5 %; 13.33 mg methylPREDNISolone acetate 40 MG/ML Outcome: tolerated well, no immediate complications Consent was given by the patient. Patient was prepped and draped in the usual sterile fashion.      Clinical Data: No additional findings.   Subjective: Chief Complaint  Patient presents with   Right Hand - Pain    Christine Hawkins is a referral from Dr. Junius Roads for right ring trigger finger.  Interpreter present today.  She has had symptoms of pain with increased use of the hand especially with opening and closing for about 3 months.  Denies any injuries.  Denies any numbness and tingling.  Denies history of rheumatoid arthritis.   Review of Systems  Constitutional: Negative.   HENT: Negative.    Eyes: Negative.   Respiratory: Negative.    Cardiovascular: Negative.   Endocrine: Negative.   Musculoskeletal: Negative.   Neurological: Negative.    Hematological: Negative.   Psychiatric/Behavioral: Negative.    All other systems reviewed and are negative.   Objective: Vital Signs: Ht 5\' 2"  (1.575 m)   Wt 165 lb (74.8 kg)   BMI 30.18 kg/m   Physical Exam Vitals and nursing note reviewed.  Constitutional:      Appearance: She is well-developed.  Pulmonary:     Effort: Pulmonary effort is normal.  Skin:    General: Skin is warm.     Capillary Refill: Capillary refill takes less than 2 seconds.  Neurological:     Mental Status: She is alert and oriented to person, place, and time.  Psychiatric:        Behavior: Behavior normal.        Thought Content: Thought content normal.        Judgment: Judgment normal.    Ortho Exam Right ring finger is tender to palpation at the distal palmar crease in line with the ring finger and the wound.  Tender nodule.  No active triggering.  All other fingers are asymptomatic and unremarkable. Specialty Comments:  No specialty comments available.  Imaging: No results found.   PMFS History: Patient Active Problem List   Diagnosis Date Noted   Nonspecific chest pain 07/07/2020   Prediabetes 07/07/2020   Trigger ring finger of right hand 07/07/2020   Chronic neck pain 11/07/2017  Primary osteoarthritis involving multiple joints 12/01/2006   Dyslipidemia 05/12/2006   COLONIC POLYPS, BENIGN, HX OF 05/12/2006   Past Medical History:  Diagnosis Date   Anxiety    Colon polyps    Depression    GERD (gastroesophageal reflux disease)    High cholesterol    Osteopenia     Family History  Problem Relation Age of Onset   Heart disease Mother    Heart disease Father    Colon polyps Father    Kidney disease Maternal Grandmother    Stomach cancer Paternal Grandmother    Diabetes Paternal Grandmother    Esophageal cancer Neg Hx    Colon cancer Neg Hx    Rectal cancer Neg Hx     Past Surgical History:  Procedure Laterality Date   BREAST LUMPECTOMY Left    CERVICAL SPINE SURGERY       2013 has 8 screws    CESAREAN SECTION     COLONOSCOPY  08/26/2009   Normal colonoscopy up to cecum    LEFT HEART CATHETERIZATION WITH CORONARY ANGIOGRAM N/A 06/07/2012   Procedure: LEFT HEART CATHETERIZATION WITH CORONARY ANGIOGRAM;  Surgeon: Laverda Page, MD;  Location: [facility] CATH LAB;  Service: Cardiovascular;  Laterality: N/A;   Social History   Occupational History   Occupation: retired  Tobacco Use   Smoking status: Never   Smokeless tobacco: Never  Vaping Use   Vaping Use: Never used  Substance and Sexual Activity   Alcohol use: Not Currently   Drug use: Not Currently   Sexual activity: Not Currently

## 2020-09-01 DIAGNOSIS — F32A Depression, unspecified: Secondary | ICD-10-CM | POA: Insufficient documentation

## 2020-09-01 DIAGNOSIS — F419 Anxiety disorder, unspecified: Secondary | ICD-10-CM | POA: Insufficient documentation

## 2020-09-01 DIAGNOSIS — M8580 Other specified disorders of bone density and structure, unspecified site: Secondary | ICD-10-CM | POA: Insufficient documentation

## 2020-09-01 DIAGNOSIS — K635 Polyp of colon: Secondary | ICD-10-CM | POA: Insufficient documentation

## 2020-09-02 ENCOUNTER — Other Ambulatory Visit: Payer: Self-pay

## 2020-09-02 ENCOUNTER — Encounter: Payer: Self-pay | Admitting: Cardiology

## 2020-09-02 ENCOUNTER — Encounter: Payer: Self-pay | Admitting: *Deleted

## 2020-09-02 ENCOUNTER — Ambulatory Visit (INDEPENDENT_AMBULATORY_CARE_PROVIDER_SITE_OTHER): Payer: Medicare Other | Admitting: Cardiology

## 2020-09-02 ENCOUNTER — Telehealth: Payer: Self-pay | Admitting: *Deleted

## 2020-09-02 VITALS — BP 140/84 | HR 54 | Ht 62.75 in | Wt 169.0 lb

## 2020-09-02 DIAGNOSIS — R7303 Prediabetes: Secondary | ICD-10-CM

## 2020-09-02 DIAGNOSIS — R42 Dizziness and giddiness: Secondary | ICD-10-CM | POA: Diagnosis not present

## 2020-09-02 DIAGNOSIS — E785 Hyperlipidemia, unspecified: Secondary | ICD-10-CM

## 2020-09-02 DIAGNOSIS — R0789 Other chest pain: Secondary | ICD-10-CM | POA: Insufficient documentation

## 2020-09-02 DIAGNOSIS — R079 Chest pain, unspecified: Secondary | ICD-10-CM

## 2020-09-02 DIAGNOSIS — R072 Precordial pain: Secondary | ICD-10-CM

## 2020-09-02 DIAGNOSIS — Z8679 Personal history of other diseases of the circulatory system: Secondary | ICD-10-CM

## 2020-09-02 MED ORDER — ROSUVASTATIN CALCIUM 40 MG PO TABS: 40.0000 mg | ORAL_TABLET | Freq: Every day | ORAL | 3 refills | Status: DC

## 2020-09-02 NOTE — Patient Instructions (Signed)
Medication Instructions:  Your physician has recommended you make the following change in your medication:  INCREASE: Crestor 40 mg once daily *If you need a refill on your cardiac medications before your next appointment, please call your pharmacy*   Lab Work: None If you have labs (blood work) drawn today and your tests are completely normal, you will receive your results only by: [place] (if you have MyChart) OR A paper copy in the mail If you have any lab test that is abnormal or we need to change your treatment, we will call you to review the results.   Testing/Procedures: Your physician has requested that you have a carotid duplex. This test is an ultrasound of the carotid arteries in your neck. It looks at blood flow through these arteries that supply the brain with blood. Allow one hour for this exam. There are no restrictions or special instructions.  Your physician has requested that you have a lexiscan myoview. For further information please visit [email or web address]. Please follow instruction sheet, as given.  Medication Instructions:  No medication changes. *If you need a refill on your cardiac medications before your next appointment, please call your pharmacy*   Lab Work: None ordered If you have labs (blood work) drawn today and your tests are completely normal, you will receive your results only by: [place] (if you have MyChart) OR A paper copy in the mail If you have any lab test that is abnormal or we need to change your treatment, we will call you to review the results.   Testing/Procedures: Your physician has requested that you have a lexiscan myoview. For further information please visit [email or web address]. Please follow instruction sheet, as given.  The test will take approximately 3 to 4 hours to complete; you may bring reading material.  If someone comes with you to your appointment, they will need to remain in the main lobby due to limited  space in the testing area. **If you are pregnant or breastfeeding, please notify the nuclear lab prior to your appointment**  How to prepare for your Myocardial Perfusion Test: Do not eat or drink 3 hours prior to your test, except you may have water. Do not consume products containing caffeine (regular or decaffeinated) 12 hours prior to your test. (ex: coffee, chocolate, sodas, tea). Do bring a list of your current medications with you.  If not listed below, you may take your medications as normal. Do wear comfortable clothes (no dresses or overalls) and walking shoes, tennis shoes preferred (No heels or open toe shoes are allowed). Do NOT wear cologne, perfume, aftershave, or lotions (deodorant is allowed). If these instructions are not followed, your test will have to be rescheduled.    Your next appointment:   2 month(s)  The format for your next appointment:   In Person  Provider:   Jenne Campus, MD   Other Instructions [facility] cardaca Cardiac Nuclear Scan Dannielle Karvonen cardaca es una prueba que se realiza para verificar el flujo de sangre hacia el corazn. Se realiza cuando est en reposo y cuando hace ejercicio. La prueba puede detectar si: No llega suficiente sangre a una regin del corazn. El msculo cardaco no funciona como debera. Puede ser [employer] le hagan esta prueba si: Wilma Flavin enfermedad cardaca. Ha tenido resultados de laboratorio que no son normales. Ha tenido una ciruga cardaca o un procedimiento de baln para abrir las arterias obstruidas (angioplastia). Siente dolor en el pecho. Le falta el aire. Para esta prueba, se pone  un tinte especial Futures trader) en el torrente sanguneo. El Consulting civil engineer. Luego una cmara tomar imgenes del corazn para ver cmo se Investment banker, operational a travs del corazn. Generalmente, esta prueba se realiza en un hospital y llevaentre 2 y 4 horas. Informe al mdico acerca de: Cualquier  alergia que tenga. Todos los [employer], incluidos vitaminas, hierbas, gotas oftlmicas, cremas y medicamentos de venta libre. Cualquier problema previo que usted o algn miembro de su familia haya tenido con los anestsicos. Cualquier trastorno de la sangre que tenga. Cirugas a las que se haya sometido. Cualquier afeccin mdica que tenga. Si est embarazada o podra estarlo. Cules son los riesgos? Por lo general, se trata de un estudio seguro. Sin embargo, pueden presentarse problemas, por ejemplo: Dolor intenso en el pecho e infarto de miocardio. Esto es solo un riesgo si se realiza la parte de la prueba de esfuerzo del procedimiento. Latidos cardacos rpidos. Una sensacin de Technical brewer. Esta sensacin por lo general no dura mucho tiempo. Una reaccin alrgica al [employer]. Qu ocurre antes de esta prueba? Consulte al mdico si debe cambiar o suspender sus medicamentos habituales. Esto es importante. Siga las indicaciones del mdico respecto de lo que no puede comer o beber. [place] de la prueba. Qu ocurre durante la prueba? Le colocarn un tubo (catter) intravenoso en una de las venas. El mdico le administrar una pequea cantidad de Geneticist, molecular a travs del tubo (catter) intravenoso. Usted esperar durante 20 a 40 minutos mientras el marcador se desplaza por el torrente sanguneo. Se supervisar el corazn con un electrocardiograma (ECG). Se recostar en una camilla. Se tomarn imgenes del corazn durante alrededor de 15 a 20 minutos. Tambin pueden hacerle Art therapist. Para esta prueba, puede realizarse una de estas cosas: Se le pedir que ejercite sobre una cinta caminadora o una bicicleta fija. Le administrarn medicamentos que harn que su corazn trabaje ms. Esto se realiza si no puede hacer ejercicio. Cuando el corazn reciba el flujo mximo de [place], se volver a Environmental consultant a travs del tubo (catter)  intravenoso. Despus de 20 a 40 minutos, regresar a la camilla. Se le tomarn ms imgenes del corazn. Segn el marcador utilizado, es posible que se deban tomar ms imgenes de 3 a 4 horas ms tarde. Cuando la prueba haya terminado, se retirar el tubo (catter) intravenoso. El estudio puede variar segn el mdico y el hospital. Sander Nephew sucede despus del estudio? Pregntele al mdico lo siguiente: Si puede retomar su rutina normal, incluidos la dieta, las actividades y [employer]. Si debe tomar ms lquido. Esto ayudar a Musician del cuerpo. Beba suficiente lquido para Contractor pis (la orina) de color amarillo plido. Consulte al mdico o pregunte en el departamento donde se realiza el estudio: Cundo estarn disponibles mis resultados? Cmo obtendr mis resultados? Resumen Una gammagrafa cardaca es una prueba que se realiza para verificar el flujo de sangre hacia el corazn. Informe al mdico si est embarazada o podra estarlo. Antes de la prueba, consulte al mdico si debe cambiar o suspender sus medicamentos habituales. Esto es importante. Consulte al mdico si puede volver a sus [employer]. Es posible que le indiquen que beba ms cantidad de lquidos. Esta informacin no tiene Marine scientist el consejo del mdico. Asegresede hacerle al mdico cualquier pregunta que tenga. Document Revised: 09/04/2017 Document Reviewed: 09/04/2017 Elsevier Patient Education  [place].

## 2020-09-02 NOTE — Progress Notes (Signed)
Cardiology Consultation:    Date:  09/02/2020   ID:  Kollette, Rolf [birth year], MRN [id number]  PCP:  Horald Pollen, MD  Cardiologist:  Jenne Campus, MD   Referring MD: Horald Pollen, *   Chief Complaint  Patient presents with   Chest Pain   Dizziness    History of Present Illness:    Christine Hawkins is a 65 y.o. female who is being seen today for the evaluation of chest pain and dizziness at the request of Horald Pollen, *.  Past medical history significant for essential hypertension, prediabetes, dyslipidemia.  She comes today to be establish as a patient.  Her chief complaint is chest pain she pinpoint chest pain and left side of her chest that typically happen when she does something that sensation last for few minutes.  She has difficulty creating this sensation.  There is no shortness of breath no sweating associated with this sensation.  She said it is gradually getting worse.  Another complaint is dizziness she describes to episodes of dizziness both happen in the morning she was trying to get up and then very quickly became dizzy to the point that she had to sit down there is no palpitations there was no passing out. She did have cardiac catheterization 2014 which showed nonobstructive disease.  There was some right coronary spasm and difficulty engaging right coronary artery but overall cardiac catheterization has been described as normal.  About 2 years ago she did see Dr. Olena Heckle for consideration of atypical chest pain she was told that the pain is not cardiac. She does not exercise on the regular basis. She takes cholesterol medication. She does not smoke never did She does have multiple family members with coronary artery disease some premature She was told years ago that she does have carotic arterial disease but does not remember percentages.  Past Medical History:  Diagnosis Date   Anxiety    Colon polyps    Depression    GERD  (gastroesophageal reflux disease)    High cholesterol    Osteopenia     Past Surgical History:  Procedure Laterality Date   BREAST LUMPECTOMY Left    CERVICAL SPINE SURGERY      2013 has 8 screws    CESAREAN SECTION     COLONOSCOPY  08/26/2009   Normal colonoscopy up to cecum    LEFT HEART CATHETERIZATION WITH CORONARY ANGIOGRAM N/A 06/07/2012   Procedure: LEFT HEART CATHETERIZATION WITH CORONARY ANGIOGRAM;  Surgeon: Laverda Page, MD;  Location: [facility] CATH LAB;  Service: Cardiovascular;  Laterality: N/A;    Current Medications: Current Meds  Medication Sig   Acetaminophen (TYLENOL PO) Take 600 mg by mouth as needed (pain).   aspirin EC 81 MG tablet Take 81 mg by mouth daily.   azelastine (OPTIVAR) 0.05 % ophthalmic solution Apply 1 drop to eye daily.   cyclobenzaprine (FLEXERIL) 10 MG tablet Take 1 tablet (10 mg total) by mouth 2 (two) times daily as needed for muscle spasms.   loratadine (CLARITIN) 10 MG tablet Take 10 mg by mouth daily.   Multiple Vitamins-Minerals (CENTRUM SILVER 50+WOMEN) TABS Take 1 tablet by mouth daily. Unknown strength   mupirocin ointment (BACTROBAN) 2 % Sig to affected area twice a day x 7 days. (Patient taking differently: Apply 1 application topically daily. Sig to affected area twice a day x 7 days.)   Omega-3 Fatty Acids (OMEGA 3 PO) Take 1 tablet by mouth daily.   PARoxetine (PAXIL)  20 MG tablet Take 2 tablets (40 mg total) by mouth daily. (Patient taking differently: Take 20 mg by mouth daily.)   rosuvastatin (CRESTOR) 20 MG tablet TAKE 1 TABLET BY MOUTH EVERY DAY (Patient taking differently: Take 20 mg by mouth daily.)   triamcinolone (NASACORT) 55 MCG/ACT AERO nasal inhaler Place 2 sprays into the nose daily.     Allergies:   Lactose intolerance (gi), Other, Bupropion, and Niaspan [niacin]   Social History   Socioeconomic History   Marital status: Legally Separated    Spouse name: Not on file   Number of children: 2   Years of education: Not  on file   Highest education level: Not on file  Occupational History   Occupation: retired  Tobacco Use   Smoking status: Never   Smokeless tobacco: Never  Vaping Use   Vaping Use: Never used  Substance and Sexual Activity   Alcohol use: Not Currently   Drug use: Not Currently   Sexual activity: Not Currently  Other Topics Concern   Not on file  Social History Narrative   Not on file   Social Determinants of Health   Financial Resource Strain: Not on file  Food Insecurity: Not on file  Transportation Needs: Not on file  Physical Activity: Not on file  Stress: Not on file  Social Connections: Not on file     Family History: The patient's family history includes Colon polyps in her father; Diabetes in her paternal grandmother; Heart disease in her father and mother; Kidney disease in her maternal grandmother; Stomach cancer in her paternal grandmother. There is no history of Esophageal cancer, Colon cancer, or Rectal cancer. ROS:   Please see the history of present illness.    All 14 point review of systems negative except as described per history of present illness.  EKGs/Labs/Other Studies Reviewed:    The following studies were reviewed today:   EKG:  EKG is ordered today.  The ekg ordered today demonstrates sinus bradycardia normal P interval normal QS complex duration morphology no ST segment changes  Recent Labs: 07/07/2020: ALT 17; BUN 9; Creatinine, Ser 0.82; Potassium 3.9; Sodium 142  Recent Lipid Panel    Component Value Date/Time   CHOL 213 (H) 07/07/2020 1359   CHOL 217 (H) 03/18/2020 1551   TRIG 98.0 07/07/2020 1359   TRIG 74 01/16/2006 1115   HDL 59.00 07/07/2020 1359   HDL 60 03/18/2020 1551   CHOLHDL 4 07/07/2020 1359   VLDL 19.6 07/07/2020 1359   LDLCALC 134 (H) 07/07/2020 1359   LDLCALC 134 (H) 03/18/2020 1551   LDLDIRECT 183.2 05/30/2006 1047    Physical Exam:    VS:  BP 140/84 (BP Location: Left Arm, Patient Position: Sitting)   Pulse (!)  54   Ht 5' 2.75" (1.594 m)   Wt 169 lb (76.7 kg)   SpO2 98%   BMI 30.18 kg/m     Wt Readings from Last 3 Encounters:  09/02/20 169 lb (76.7 kg)  07/28/20 165 lb (74.8 kg)  07/07/20 165 lb 3.2 oz (74.9 kg)     GEN:  Well nourished, well developed in no acute distress HEENT: Normal NECK: No JVD; No carotid bruits LYMPHATICS: No lymphadenopathy CARDIAC: RRR, no murmurs, no rubs, no gallops RESPIRATORY:  Clear to auscultation without rales, wheezing or rhonchi  ABDOMEN: Soft, non-tender, non-distended MUSCULOSKELETAL:  No edema; No deformity  SKIN: Warm and dry NEUROLOGIC:  Alert and oriented x 3 PSYCHIATRIC:  Normal affect   ASSESSMENT:  1. Pre-diabetes   2. Chest pain of uncertain etiology   3. Dyslipidemia   4. Dizziness    PLAN:    In order of problems listed above:  Chest pain which is atypical but lady does have multiple risk factors for coronary artery disease.  Encouraging is the fact that in 2014 she got cardiac catheterization which show normal coronaries except for spasm on the right coronary artery.  She is already on aspirin which I will continue I will augment her therapy for cholesterol I will go with Crestor from 20-40.  I will schedule her to have [place]. Dizziness look like orthostatic.  I told her to stay well-hydrated I asked her also to let me know if this sensation will recur. Prediabetes: That being followed by antimedicine team. Dyslipidemia I did review her K PN which show me her LDL of 134 HDL 59.  Will double the dose of Crestor to 40 mg which will be high intensity statin and I will recheck her fasting lipid profile within the next 6 weeks, AST ALT need to be repeated. Carotic arterial disease according to the patient.  We will schedule her to have carotic ultrasound.   Medication Adjustments/Labs and Tests Ordered: Current medicines are reviewed at length with the patient today.  Concerns regarding medicines are outlined above.  No orders of  the defined types were placed in this encounter.  No orders of the defined types were placed in this encounter.   Signed, Park Liter, MD, [facility] 09/02/2020 10:18 AM    [place]

## 2020-09-02 NOTE — Telephone Encounter (Signed)
Patient given detailed instructions by interpreting line [place] [id number] per Myocardial Perfusion Study Information Sheet for the test on 09/08/20 at 0815. Patient notified to arrive 15 minutes early and that it is imperative to arrive on time for appointment to keep from having the test rescheduled.  If you need to cancel or reschedule your appointment, please call the office within 24 hours of your appointment. . Patient verbalized understanding.[place]  Mych[place] letter sent

## 2020-09-08 ENCOUNTER — Ambulatory Visit (INDEPENDENT_AMBULATORY_CARE_PROVIDER_SITE_OTHER): Payer: Medicare Other

## 2020-09-08 ENCOUNTER — Other Ambulatory Visit: Payer: Self-pay

## 2020-09-08 DIAGNOSIS — R072 Precordial pain: Secondary | ICD-10-CM

## 2020-09-08 LAB — MYOCARDIAL PERFUSION IMAGING
Peak HR: 89 {beats}/min
Rest HR: 47 {beats}/min

## 2020-09-08 MED ORDER — TECHNETIUM TC 99M TETROFOSMIN IV KIT
10.2000 | PACK | Freq: Once | INTRAVENOUS | Status: AC | PRN
  Administered 2020-09-08: 10.2 via INTRAVENOUS

## 2020-09-08 MED ORDER — REGADENOSON 0.4 MG/5ML IV SOLN
0.4000 mg | Freq: Once | INTRAVENOUS | Status: AC
  Administered 2020-09-08: 0.4 mg via INTRAVENOUS

## 2020-09-08 MED ORDER — TECHNETIUM TC 99M TETROFOSMIN IV KIT
30.6000 | PACK | Freq: Once | INTRAVENOUS | Status: AC | PRN
  Administered 2020-09-08: 30.6 via INTRAVENOUS

## 2020-09-09 ENCOUNTER — Telehealth: Payer: Self-pay

## 2020-09-09 NOTE — Telephone Encounter (Signed)
Patient notified of results and results sent to PCP

## 2020-09-09 NOTE — Telephone Encounter (Signed)
-----   Message from Jenean Lindau, MD sent at 09/09/2020  1:55 PM EDT ----- The results of the study is unremarkable. Please inform patient. I will discuss in detail at next appointment. Cc  primary care/referring physician Jenean Lindau, MD 09/09/2020 1:55 PM

## 2020-09-12 ENCOUNTER — Other Ambulatory Visit: Payer: Self-pay | Admitting: Emergency Medicine

## 2020-09-12 DIAGNOSIS — E785 Hyperlipidemia, unspecified: Secondary | ICD-10-CM

## 2020-09-16 ENCOUNTER — Other Ambulatory Visit: Payer: Self-pay

## 2020-09-16 ENCOUNTER — Ambulatory Visit (INDEPENDENT_AMBULATORY_CARE_PROVIDER_SITE_OTHER): Payer: Medicare Other

## 2020-09-16 DIAGNOSIS — Z8679 Personal history of other diseases of the circulatory system: Secondary | ICD-10-CM

## 2020-09-16 DIAGNOSIS — I6523 Occlusion and stenosis of bilateral carotid arteries: Secondary | ICD-10-CM

## 2020-09-17 ENCOUNTER — Ambulatory Visit: Payer: Medicare Other | Admitting: Cardiovascular Disease

## 2020-09-18 ENCOUNTER — Telehealth: Payer: Self-pay

## 2020-09-18 NOTE — Telephone Encounter (Signed)
-----   Message from Park Liter, MD sent at 09/17/2020 11:38 AM EDT ----- Up to 39% stenosis in both carotid arteries, none of this is critical

## 2020-09-18 NOTE — Telephone Encounter (Signed)
Patient notified of test results 

## 2020-11-02 DIAGNOSIS — E7800 Pure hypercholesterolemia, unspecified: Secondary | ICD-10-CM | POA: Insufficient documentation

## 2020-11-03 ENCOUNTER — Ambulatory Visit (INDEPENDENT_AMBULATORY_CARE_PROVIDER_SITE_OTHER): Payer: Medicare Other | Admitting: Cardiology

## 2020-11-03 ENCOUNTER — Other Ambulatory Visit: Payer: Self-pay

## 2020-11-03 VITALS — BP 140/72 | HR 56 | Ht 62.75 in | Wt 168.0 lb

## 2020-11-03 DIAGNOSIS — E785 Hyperlipidemia, unspecified: Secondary | ICD-10-CM | POA: Diagnosis not present

## 2020-11-03 DIAGNOSIS — R7303 Prediabetes: Secondary | ICD-10-CM

## 2020-11-03 DIAGNOSIS — R0789 Other chest pain: Secondary | ICD-10-CM | POA: Diagnosis not present

## 2020-11-03 DIAGNOSIS — I6523 Occlusion and stenosis of bilateral carotid arteries: Secondary | ICD-10-CM

## 2020-11-03 MED ORDER — EZETIMIBE 10 MG PO TABS: 10.0000 mg | ORAL_TABLET | Freq: Every day | ORAL | 1 refills | Status: DC

## 2020-11-03 MED ORDER — ROSUVASTATIN CALCIUM 40 MG PO TABS: 40.0000 mg | ORAL_TABLET | Freq: Every day | ORAL | 3 refills | Status: DC

## 2020-11-03 NOTE — Addendum Note (Signed)
Addended by: Senaida Ores on: 11/03/2020 01:13 PM   Modules accepted: Orders

## 2020-11-03 NOTE — Patient Instructions (Signed)
Medication Instructions:  Your physician has recommended you make the following change in your medication:   START: Zetia 10 mg daily     *If you need a refill on your cardiac medications before your next appointment, please call your pharmacy*   Lab Work: Your physician recommends that you return for lab work 6 weeks fasting   If you have labs (blood work) drawn today and your tests are completely normal, you will receive your results only by: [place] (if you have MyChart) OR A paper copy in the mail If you have any lab test that is abnormal or we need to change your treatment, we will call you to review the results.   Testing/Procedures: None   Follow-Up: At [facility], you and your health needs are our priority.  As part of our continuing mission to provide you with exceptional heart care, we have created designated Provider Care Teams.  These Care Teams include your primary Cardiologist (physician) and Advanced Practice Providers (APPs -  Physician Assistants and Nurse Practitioners) who all work together to provide you with the care you need, when you need it.  We recommend signing up for the patient portal called "MyChart".  Sign up information is provided on this After Visit Summary.  MyChart is used to connect with patients for Virtual Visits (Telemedicine).  Patients are able to view lab/test results, encounter notes, upcoming appointments, etc.  Non-urgent messages can be sent to your provider as well.   To learn more about what you can do with MyChart, go to [email or web address].    Your next appointment:   6 month(s)  The format for your next appointment:   In Person  Provider:   Jenne Campus, MD   Other Instructions  Ezetimibe Tablets What is this medication? EZETIMIBE (ez ET i mibe) treats high cholesterol. It works by reducing the amount of cholesterol absorbed from the food you eat. This decreases the amount of bad cholesterol (such as  LDL) in your blood. Changes to diet and exercise are often combined with this medication. This medicine may be used for other purposes; ask your health care provider or pharmacist if you have questions. COMMON BRAND NAME(S): Zetia What should I tell my care team before I take this medication? They need to know if you have any of these conditions: Kidney disease Liver disease Muscle cramps, pain Muscle injury Thyroid disease An unusual or allergic reaction to ezetimibe, other medications, foods, dyes, or preservatives Pregnant or trying to get pregnant Breast-feeding How should I use this medication? Take this medication by mouth. Take it as directed on the prescription label at the same time every day. You can take it with or without food. If it upsets your stomach, take it with food. Keep taking it unless your care team tells you to stop. Take bile acid sequestrants at a different time of day than this medication. Take this medication 2 hours BEFORE or 4 hours AFTER bile acid sequestrants. Talk to your care team about the use of this medication in children. While it may be prescribed for children as young as 10 for selected conditions, precautions do apply. Overdosage: If you think you have taken too much of this medicine contact a poison control center or emergency room at once. NOTE: This medicine is only for you. Do not share this medicine with others. What if I miss a dose? If you miss a dose, take it as soon as you can. If it is almost time for  your next dose, take only that dose. Do not take double or extra doses. What may interact with this medication? Do not take this medication with any of the following: Fenofibrate Gemfibrozil This medication may also interact with the following: Antacids Cyclosporine Herbal medications like red yeast rice Other medications to lower cholesterol or triglycerides This list may not describe all possible interactions. Give your health care  provider a list of all the medicines, herbs, non-prescription drugs, or dietary supplements you use. Also tell them if you smoke, drink alcohol, or use illegal drugs. Some items may interact with your medicine. What should I watch for while using this medication? Visit your care team for regular checks on your progress. Tell your care team if your symptoms do not start to get better or if they get worse. Your care team may tell you to stop taking this medication if you develop muscle problems. If your muscle problems do not go away after stopping this medication, contact your care team. Do not become pregnant while taking this medication. Women should inform their care team if they wish to become pregnant or think they might be pregnant. There is potential for serious harm to an unborn child. Talk to your care team for more information. Do not breast-feed an infant while taking this medication. Taking this medication is only part of a total heart healthy program. Your care team may give you a special diet to follow. Avoid alcohol. Avoid smoking. Ask your care team how much you should exercise. What side effects may I notice from receiving this medication? Side effects that you should report to your doctor or health care provider as soon as possible: Allergic reactions-skin rash, itching or hives, swelling of the face, lips, tongue, or throat Side effects that usually do not require medical attention (report to your doctor or health care provider if they continue or are bothersome): Diarrhea Joint pain This list may not describe all possible side effects. Call your doctor for medical advice about side effects. You may report side effects to FDA at 1-800-FDA-1088. Where should I keep my medication? Keep out of the reach of children and pets. Store at room temperature between 15 and 30 degrees C (59 and 86 degrees F). Protect from moisture. Get rid of any unused medication after the expiration  date. NOTE: This sheet is a summary. It may not cover all possible information. If you have questions about this medicine, talk to your doctor, pharmacist, or health care provider.  2022 Elsevier/Gold Standard (2020-02-21 12:29:13)

## 2020-11-03 NOTE — Progress Notes (Signed)
Cardiology Office Note:    Date:  11/03/2020   ID:  Christine Hawkins, Christine Hawkins October 17, 1955, MRN [id number]  PCP:  Horald Pollen, MD  Cardiologist:  Jenne Campus, MD    Referring MD: Horald Pollen, *   Chief Complaint  Patient presents with   Results   Medication Refill    Paxil and Crestor    History of Present Illness:    Christine Hawkins is a 65 y.o. female who was referred to [place] because of atypical chest pain, also some dizziness as well as dyslipidemia she did have batteries of tests done which include cardiac ultrasounds which show up to 39% stenosis of both sides, there is no TIA no CVA symptoms, she also had a stress test done which showed no evidence of ischemia.  She comes today to talk about that.  She denies have any chest pain tightness squeezing pressure burning chest.  She complained of having weakness fatigue, she is trying to walk on the regular basis she is trying to stick with a good diet overall she seems to be doing well.  She tolerated her medication quite well.  Past Medical History:  Diagnosis Date   Anxiety    Colon polyps    Depression    GERD (gastroesophageal reflux disease)    High cholesterol    Osteopenia     Past Surgical History:  Procedure Laterality Date   BREAST LUMPECTOMY Left    CERVICAL SPINE SURGERY      2013 has 8 screws    CESAREAN SECTION     COLONOSCOPY  08/26/2009   Normal colonoscopy up to cecum    LEFT HEART CATHETERIZATION WITH CORONARY ANGIOGRAM N/A 06/07/2012   Procedure: LEFT HEART CATHETERIZATION WITH CORONARY ANGIOGRAM;  Surgeon: Laverda Page, MD;  Location: [facility] CATH LAB;  Service: Cardiovascular;  Laterality: N/A;    Current Medications: Current Meds  Medication Sig   Acetaminophen (TYLENOL PO) Take 600 mg by mouth as needed (pain). Unknown strength   aspirin EC 81 MG tablet Take 81 mg by mouth daily.   azelastine (OPTIVAR) 0.05 % ophthalmic solution Apply 1 drop to eye daily.   [employer] COENZYME Q10 PO Take 100 mg  by mouth daily.   cyclobenzaprine (FLEXERIL) 10 MG tablet Take 1 tablet (10 mg total) by mouth 2 (two) times daily as needed for muscle spasms.   loratadine (CLARITIN) 10 MG tablet Take 10 mg by mouth daily.   Multiple Vitamins-Minerals (CENTRUM SILVER 50+WOMEN) TABS Take 1 tablet by mouth daily. Unknown strength   mupirocin ointment (BACTROBAN) 2 % Sig to affected area twice a day x 7 days. (Patient taking differently: Apply 1 application topically daily. Sig to affected area twice a day x 7 days.)   Omega-3 Fatty Acids (OMEGA 3 PO) Take 1 tablet by mouth daily. Unknown strength   PARoxetine (PAXIL) 20 MG tablet Take 2 tablets (40 mg total) by mouth daily. (Patient taking differently: Take 20 mg by mouth daily.)   rosuvastatin (CRESTOR) 40 MG tablet Take 1 tablet (40 mg total) by mouth daily.   triamcinolone (NASACORT) 55 MCG/ACT AERO nasal inhaler Place 2 sprays into the nose daily.     Allergies:   Lactose intolerance (gi), Other, Bupropion, and Niaspan [niacin]   Social History   Socioeconomic History   Marital status: Legally Separated    Spouse name: Not on file   Number of children: 2   Years of education: Not on file   Highest education level: Not  on file  Occupational History   Occupation: retired  Tobacco Use   Smoking status: Never   Smokeless tobacco: Never  Vaping Use   Vaping Use: Never used  Substance and Sexual Activity   Alcohol use: Not Currently   Drug use: Not Currently   Sexual activity: Not Currently  Other Topics Concern   Not on file  Social History Narrative   Not on file   Social Determinants of Health   Financial Resource Strain: Not on file  Food Insecurity: Not on file  Transportation Needs: Not on file  Physical Activity: Not on file  Stress: Not on file  Social Connections: Not on file     Family History: The patient's family history includes Colon polyps in her father; Diabetes in her paternal grandmother; Heart disease in her father and  mother; Kidney disease in her maternal grandmother; Stomach cancer in her paternal grandmother. There is no history of Esophageal cancer, Colon cancer, or Rectal cancer. ROS:   Please see the history of present illness.    All 14 point review of systems negative except as described per history of present illness  EKGs/Labs/Other Studies Reviewed:      Recent Labs: 07/07/2020: ALT 17; BUN 9; Creatinine, Ser 0.82; Potassium 3.9; Sodium 142  Recent Lipid Panel    Component Value Date/Time   CHOL 213 (H) 07/07/2020 1359   CHOL 217 (H) 03/18/2020 1551   TRIG 98.0 07/07/2020 1359   TRIG 74 01/16/2006 1115   HDL 59.00 07/07/2020 1359   HDL 60 03/18/2020 1551   CHOLHDL 4 07/07/2020 1359   VLDL 19.6 07/07/2020 1359   LDLCALC 134 (H) 07/07/2020 1359   LDLCALC 134 (H) 03/18/2020 1551   LDLDIRECT 183.2 05/30/2006 1047    Physical Exam:    VS:  BP 140/72 (BP Location: Right Arm, Patient Position: Sitting)   Pulse (!) 56   Ht 5' 2.75" (1.594 m)   Wt 168 lb (76.2 kg)   SpO2 96%   BMI 30.00 kg/m     Wt Readings from Last 3 Encounters:  11/03/20 168 lb (76.2 kg)  09/08/20 169 lb (76.7 kg)  09/02/20 169 lb (76.7 kg)     GEN:  Well nourished, well developed in no acute distress HEENT: Normal NECK: No JVD; No carotid bruits LYMPHATICS: No lymphadenopathy CARDIAC: RRR, no murmurs, no rubs, no gallops RESPIRATORY:  Clear to auscultation without rales, wheezing or rhonchi  ABDOMEN: Soft, non-tender, non-distended MUSCULOSKELETAL:  No edema; No deformity  SKIN: Warm and dry LOWER EXTREMITIES: no swelling NEUROLOGIC:  Alert and oriented x 3 PSYCHIATRIC:  Normal affect   ASSESSMENT:    1. Bilateral carotid artery stenosis   2. Dyslipidemia   3. Pre-diabetes   4. Atypical chest pain    PLAN:    In order of problems listed above:  Bilateral carotic artery stenosis up to 40%.  The key is risk factors modification she does not smoke she is on diet which is good she is also  exercising on the regular basis.  The key right now is to get her cholesterol to control.  I will add Zetia 10 mg daily to her medical regimen.  She is already on Crestor 40.  I will continue.  Target cholesterol need to be at least less than 100 favorably less than 70. Dyslipidemia: Discussion as above Prediabetes we did talk about good diet as well as need to exercise and regular basis which she is trying to do Atypical chest pain,  denies having any stress test negative cardiac catheterization from 2014 negative Her last cholesterol reviewed which show LDL of 134 HDL 59 this is from May of this year and dated from [facility] that I reviewed for this visit   Medication Adjustments/Labs and Tests Ordered: Current medicines are reviewed at length with the patient today.  Concerns regarding medicines are outlined above.  No orders of the defined types were placed in this encounter.  Medication changes: No orders of the defined types were placed in this encounter.   Signed, Park Liter, MD, [facility] 11/03/2020 1:00 PM    [place]

## 2020-11-25 ENCOUNTER — Telehealth: Payer: Self-pay | Admitting: Emergency Medicine

## 2020-11-25 NOTE — Telephone Encounter (Signed)
1.Medication Requested: PARoxetine (PAXIL) 20 MG tablet (Expired)  2. Pharmacy (Name, Street, [place]):  [id number] - [place], [place] Phone:  [phone]  Fax:  [phone]      4. Last Visit with PCP: 5.31.22   Agent: Please be advised that RX refills may take up to 3 business days. We ask that you follow-up with your pharmacy.

## 2020-11-26 MED ORDER — PAROXETINE HCL 20 MG PO TABS: 40.0000 mg | ORAL_TABLET | Freq: Every day | ORAL | 3 refills | Status: DC

## 2020-11-26 NOTE — Telephone Encounter (Signed)
Medication refilled

## 2020-11-27 LAB — LIPID PANEL
Chol/HDL Ratio: 3.3 ratio (ref 0.0–4.4)
Cholesterol, Total: 180 mg/dL (ref 100–199)
HDL: 55 mg/dL (ref >39)
LDL Chol Calc (NIH): 109 mg/dL — ABNORMAL HIGH (ref 0–99)
Triglycerides: 89 mg/dL (ref 0–149)
VLDL Cholesterol Cal: 16 mg/dL (ref 5–40)

## 2020-12-01 ENCOUNTER — Telehealth: Payer: Self-pay

## 2020-12-01 DIAGNOSIS — E785 Hyperlipidemia, unspecified: Secondary | ICD-10-CM

## 2020-12-01 MED ORDER — EZETIMIBE 10 MG PO TABS: 10.0000 mg | ORAL_TABLET | Freq: Every day | ORAL | 1 refills | Status: DC

## 2020-12-01 NOTE — Telephone Encounter (Signed)
-----   Message from Park Liter, MD sent at 11/30/2020  9:47 AM EDT ----- Cholesterol still elevated please add Zetia 10 mg daily to her medical regiment, she is to have fasting lipid profile, AST ALT done in 6 weeks

## 2020-12-01 NOTE — Telephone Encounter (Signed)
Patient notified of results and recommendations, agreed to plan. Rx sent, order placed, patient aware to be fasting for blood work

## 2020-12-07 ENCOUNTER — Encounter: Payer: Self-pay | Admitting: Emergency Medicine

## 2020-12-07 ENCOUNTER — Ambulatory Visit (INDEPENDENT_AMBULATORY_CARE_PROVIDER_SITE_OTHER): Payer: Medicare Other | Admitting: Emergency Medicine

## 2020-12-07 ENCOUNTER — Other Ambulatory Visit: Payer: Self-pay

## 2020-12-07 VITALS — BP 118/70 | HR 60 | Temp 98.5°F | Ht 62.75 in | Wt 168.0 lb

## 2020-12-07 DIAGNOSIS — R21 Rash and other nonspecific skin eruption: Secondary | ICD-10-CM | POA: Diagnosis not present

## 2020-12-07 DIAGNOSIS — M159 Polyosteoarthritis, unspecified: Secondary | ICD-10-CM | POA: Diagnosis not present

## 2020-12-07 DIAGNOSIS — R7303 Prediabetes: Secondary | ICD-10-CM | POA: Diagnosis not present

## 2020-12-07 DIAGNOSIS — E785 Hyperlipidemia, unspecified: Secondary | ICD-10-CM | POA: Diagnosis not present

## 2020-12-07 DIAGNOSIS — I6523 Occlusion and stenosis of bilateral carotid arteries: Secondary | ICD-10-CM

## 2020-12-07 NOTE — Assessment & Plan Note (Addendum)
Diet and nutrition discussed.  Advised to decrease amount of daily carbohydrate intake. 

## 2020-12-07 NOTE — Progress Notes (Signed)
Christine Hawkins 65 y.o.   Chief Complaint  Patient presents with   Follow-up    F/U results from cardiologist.    HISTORY OF PRESENT ILLNESS: This is a 65 y.o. female seen recently by cardiologist.  Here for follow-up. Had the following 2 tests done: Carotid ultrasound that showed: Up to 39% stenosis in both carotid arteries, none of this is critical Unremarkable stress test.  Normal study. Patient asymptomatic.  No complaints or any other medical concerns today.  HPI   Prior to Admission medications   Medication Sig Start Date End Date Taking? Authorizing Provider  Acetaminophen (TYLENOL PO) Take 600 mg by mouth as needed (pain). Unknown strength   Yes [provider]  aspirin EC 81 MG tablet Take 81 mg by mouth daily.   Yes [provider]  azelastine (OPTIVAR) 0.05 % ophthalmic solution Apply 1 drop to eye daily. 01/27/20  Yes [provider]  [facility] COENZYME Q10 PO Take 100 mg by mouth daily.   Yes [provider]  cyclobenzaprine (FLEXERIL) 10 MG tablet Take 1 tablet (10 mg total) by mouth 2 (two) times daily as needed for muscle spasms. 12/17/18  Yes Dorie Rank, MD  ezetimibe (ZETIA) 10 MG tablet Take 1 tablet (10 mg total) by mouth daily. 11/03/20 02/01/21 Yes Park Liter, MD  ezetimibe (ZETIA) 10 MG tablet Take 1 tablet (10 mg total) by mouth daily. 12/01/20 03/01/21 Yes Park Liter, MD  loratadine (CLARITIN) 10 MG tablet Take 10 mg by mouth daily.   Yes [provider]  Multiple Vitamins-Minerals (CENTRUM SILVER 50+WOMEN) TABS Take 1 tablet by mouth daily. Unknown strength   Yes [provider]  mupirocin ointment (BACTROBAN) 2 % Sig to affected area twice a day x 7 days. Patient taking differently: Apply 1 application topically daily. Sig to affected area twice a day x 7 days. 03/18/19  Yes Arretta Toenjes, Ines Bloomer, MD  Omega-3 Fatty Acids (OMEGA 3 PO) Take 1 tablet by mouth daily. Unknown strength   Yes [provider]  PARoxetine (PAXIL) 20 MG tablet Take 2 tablets (40 mg total) by mouth daily. 11/26/20 02/24/21 Yes Kirubel Aja, Ines Bloomer, MD  rosuvastatin (CRESTOR) 40 MG tablet Take 1 tablet (40 mg total) by mouth daily. 11/03/20 02/01/21 Yes Park Liter, MD  triamcinolone (NASACORT) 55 MCG/ACT AERO nasal inhaler Place 2 sprays into the nose daily. 03/18/19  Yes Horald Pollen, MD    Allergies  Allergen Reactions   Lactose Intolerance (Gi)    Other     Dish/laundry dish detergent and bleach-smell bothers patient and dust   Bupropion Nausea Only   Niaspan [Niacin] Rash    Itching    Patient Active Problem List   Diagnosis Date Noted   High cholesterol 11/02/2020   Atypical chest pain 09/02/2020   Dizziness 09/02/2020   Osteopenia 09/01/2020   Depression 09/01/2020   Colon polyps 09/01/2020   Anxiety 09/01/2020   Trigger ring finger of right hand 07/07/2020   Left shoulder pain 12/12/2017   Chronic neck pain 11/07/2017   Constipation 05/11/2017   Bilateral carotid artery stenosis 05/11/2017   SOB (shortness of breath) 05/11/2017   Insomnia due to other mental disorder 04/19/2016   Influenza A 03/15/2016   Need for influenza vaccination 12/17/2015   Low back pain 09/14/2015   Pre-diabetes 04/02/2015   Hyperlipidemia LDL goal <130 04/02/2015   Gastroesophageal reflux disease without esophagitis 04/02/2015   Intrinsic eczema 04/02/2015   Recurrent major depressive disorder,  in full remission ([place]) 04/02/2015   Elevated glucose 01/30/2015   Elevated ALT measurement 01/30/2015   Carpal tunnel syndrome of right wrist 01/30/2015   Bilateral leg edema 01/30/2015   Status post cervical spinal arthrodesis 05/22/2011   Hypokalemia 04/29/2011   Anemia due to blood loss 04/28/2011   Respiratory insufficiency following shock, trauma, or surgery 04/28/2011   Primary osteoarthritis involving multiple joints 12/01/2006   Dyslipidemia 05/12/2006   COLONIC POLYPS, BENIGN, HX  OF 05/12/2006    Past Medical History:  Diagnosis Date   Anxiety    Colon polyps    Depression    GERD (gastroesophageal reflux disease)    High cholesterol    Osteopenia     Past Surgical History:  Procedure Laterality Date   BREAST LUMPECTOMY Left    CERVICAL SPINE SURGERY      2013 has 8 screws    CESAREAN SECTION     COLONOSCOPY  08/26/2009   Normal colonoscopy up to cecum    LEFT HEART CATHETERIZATION WITH CORONARY ANGIOGRAM N/A 06/07/2012   Procedure: LEFT HEART CATHETERIZATION WITH CORONARY ANGIOGRAM;  Surgeon: Laverda Page, MD;  Location: [facility] CATH LAB;  Service: Cardiovascular;  Laterality: N/A;    Social History   Socioeconomic History   Marital status: Legally Separated    Spouse name: Not on file   Number of children: 2   Years of education: Not on file   Highest education level: Not on file  Occupational History   Occupation: retired  Tobacco Use   Smoking status: Never   Smokeless tobacco: Never  Vaping Use   Vaping Use: Never used  Substance and Sexual Activity   Alcohol use: Not Currently   Drug use: Not Currently   Sexual activity: Not Currently  Other Topics Concern   Not on file  Social History Narrative   Not on file   Social Determinants of Health   Financial Resource Strain: Not on file  Food Insecurity: Not on file  Transportation Needs: Not on file  Physical Activity: Not on file  Stress: Not on file  Social Connections: Not on file  Intimate Partner Violence: Not on file    Family History  Problem Relation Age of Onset   Heart disease Mother    Heart disease Father    Colon polyps Father    Kidney disease Maternal Grandmother    Stomach cancer Paternal Grandmother    Diabetes Paternal Grandmother    Esophageal cancer Neg Hx    Colon cancer Neg Hx    Rectal cancer Neg Hx      Review of Systems  Constitutional: Negative.  Negative for chills and fever.  HENT: Negative.  Negative for congestion and sore throat.    Respiratory: Negative.  Negative for cough and shortness of breath.   Cardiovascular:  Negative for chest pain and palpitations.  Genitourinary:  Negative for dysuria and hematuria.  Skin:  Positive for rash.  Neurological:  Negative for dizziness and headaches.  All other systems reviewed and are negative.   Physical Exam Vitals reviewed.  Constitutional:      Appearance: Normal appearance.  HENT:     Head: Normocephalic.  Eyes:     Extraocular Movements: Extraocular movements intact.     Pupils: Pupils are equal, round, and reactive to light.  Cardiovascular:     Rate and Rhythm: Normal rate.  Pulmonary:     Effort: Pulmonary effort is normal.  Skin:    General: Skin is warm and  dry.     Capillary Refill: Capillary refill takes less than 2 seconds.     Findings: Rash (Chronic rash to mid back) present.  Neurological:     General: No focal deficit present.     Mental Status: She is alert and oriented to person, place, and time.  Psychiatric:        Mood and Affect: Mood normal.        Behavior: Behavior normal.     ASSESSMENT & PLAN: Problem List Items Addressed This Visit       Cardiovascular and Mediastinum   Bilateral carotid artery stenosis    Had rosuvastatin increased to 40 mg daily and started on Zetia 10 mg daily.        Musculoskeletal and Integument   Primary osteoarthritis involving multiple joints    Chronic but stable.        Other   Dyslipidemia   Pre-diabetes    Diet and nutrition discussed.  Advised to decrease amount of daily carbohydrate intake.      Other Visit Diagnoses     Prediabetes    -  Primary   Rash and nonspecific skin eruption       Relevant Orders   Ambulatory referral to Dermatology      Patient Instructions  Mantenimiento de la salud despus de los 21 aos de edad Health Maintenance After Age 27 Despus de los 65 aos de edad, corre un riesgo mayor de Tourist information centre manager enfermedades e infecciones a Barrister's clerk, como  tambin de sufrir lesiones por cadas. Las cadas son la causa principal de las fracturas de huesos y lesiones en la cabeza de personas mayores de 54 aos de edad. Recibir cuidados preventivos de forma regular puede ayudarlo a mantenerse saludable y en buen [place]. Los cuidados preventivos incluyen realizarse anlisis de forma regular y Actor en el estilo de vida segn las recomendaciones del mdico. Converse con el mdico sobre lo siguiente: Las pruebas de deteccin y los anlisis que debe Dispensing optician. Una prueba de deteccin es un estudio que se para Hydrographic surveyor la presencia de una enfermedad cuando no tiene sntomas. Un plan de dieta y ejercicios adecuado para usted. Qu debo saber sobre las pruebas de deteccin y los anlisis para prevenir cadas? Realizarse pruebas de deteccin y [employer] es la mejor manera de Hydrographic surveyor un problema de salud de forma temprana. El diagnstico y tratamiento tempranos le brindan la mejor oportunidad de Chief Technology Officer las afecciones mdicas que son comunes despus de los 76 aos de edad. Ciertas afecciones y elecciones de estilo de vida pueden hacer que sea ms propenso a sufrir Engineer, manufacturing. El mdico puede recomendarle lo siguiente: Controles regulares de la visin. Una visin deficiente y afecciones como las cataratas pueden hacer que sea ms propenso a sufrir Engineer, manufacturing. Si [place] lentes, asegrese de obtener una receta actualizada si su visin cambia. Revisin de medicamentos. Revise regularmente con el mdico todos los medicamentos que toma, incluidos los medicamentos de [place]. Consulte al [employer] efectos secundarios que pueden hacer que sea ms propenso a sufrir Engineer, manufacturing. Informe al mdico si alguno de los medicamentos que toma lo hace sentir mareado o somnoliento. Pruebas de deteccin para la osteoporosis. La osteoporosis es una afeccin que hace que los huesos se vuelvan ms frgiles. En consecuencia, los huesos pueden debilitarse y quebrarse ms  fcilmente. Pruebas de deteccin para la presin arterial. Los cambios en la presin arterial y los medicamentos para controlar la presin arterial pueden hacerlo sentir  mareado. Controles de fuerza y equilibrio. El mdico puede recomendar ciertos estudios para controlar su fuerza y equilibrio al estar de pie, al caminar o al cambiar de posicin. Examen de los pies. El dolor y Chiropractor en los pies, como tambin no utilizar el calzado [place], pueden hacer que sea ms propenso a sufrir Engineer, manufacturing. Prueba de deteccin de la depresin. Es ms probable que sufra una cada si tiene miedo a caerse, se siente mal emocionalmente o se siente incapaz de Patent examiner. Prueba de deteccin de consumo de alcohol. Beber demasiado alcohol puede afectar su equilibrio y puede hacer que sea ms propenso a sufrir Engineer, manufacturing. Qu medidas puedo tomar para reducir mi riesgo de sufrir una cada? Instrucciones generales Hable con el mdico sobre sus riesgos de sufrir una cada. Infrmele al mdico si: Se cae. Asegrese de informarle a su mdico acerca de todas las cadas, incluso aquellas que parecen ser [employer] Se siente mareado, somnoliento o que pierde el equilibrio. Use los medicamentos de venta libre y los recetados solamente como se lo haya indicado el mdico. Estos incluyen todos los suplementos. Siga una dieta sana y [place] un peso saludable. Una dieta saludable incluye productos lcteos descremados, carnes bajas en contenido de grasa ([place]), fibra de granos enteros, frijoles y [place] frutas y verduras. Seguridad en el hogar Retire los objetos que puedan causar tropiezos tales como alfombras, cables u obstculos. Instale equipos de seguridad, como barras para sostn en los baos y barandas de seguridad en las escaleras. [place] habitaciones y los pasillos bien iluminados. Actividad  Siga un programa de ejercicio regular para mantenerse en forma. Esto lo ayudar a Insurance account manager equilibrio. Consulte al mdico qu tipos de ejercicios son adecuados para usted. Si necesita un bastn o un andador, selo segn las recomendaciones del mdico. Utilice calzado con buen apoyo y suela antideslizante. Estilo de vida No beba alcohol si el mdico se lo prohbe. Si bebe alcohol, limite la cantidad que consume: De 0 a 1 medida por da para las mujeres. De 0 a 2 medidas por da para los hombres. Est atento a la cantidad de alcohol que hay en las bebidas que toma. En los [place], una medida equivale a una botella tpica de cerveza (12 oz/355 ml), medio vaso de vino (5 oz/148 ml) o un trago de bebidas alcohlicas de alta graduacin (1 oz/45 ml). No consuma ningn producto que contenga nicotina o tabaco, como cigarrillos y Psychologist, sport and exercise. Si necesita ayuda para dejar de consumir, consulte al [employer]. Resumen Tener un estilo de vida saludable y recibir cuidados preventivos pueden ayudar a Theatre stage manager salud y el bienestar despus de los 51 aos de [place]. Realizarse pruebas de deteccin y [employer] es la mejor manera de Hydrographic surveyor un problema de salud de forma temprana y Lourena Simmonds a Product/process development scientist una cada. El diagnstico y tratamiento tempranos le brindan la mejor oportunidad de Chief Technology Officer las afecciones mdicas ms comunes en las personas mayores de 62 aos de edad. Las cadas son la causa principal de las fracturas de huesos y lesiones en la cabeza de personas mayores de 52 aos de edad. Tome precauciones para evitar una cada en su casa. Trabaje con el mdico para saber qu cambios que puede hacer para mejorar su salud y [place], y [place]. Esta informacin no tiene Marine scientist el consejo del mdico. Asegrese de hacerle al mdico cualquier pregunta que tenga. Document Revised: 02/04/2020 Document Reviewed: 02/04/2020 Elsevier Patient Education  2022 Elsevier  Inc.    Agustina Caroli, MD [facility]

## 2020-12-07 NOTE — Assessment & Plan Note (Signed)
Chronic but stable.

## 2020-12-07 NOTE — Assessment & Plan Note (Signed)
Had rosuvastatin increased to 40 mg daily and started on Zetia 10 mg daily.

## 2020-12-07 NOTE — Patient Instructions (Signed)
Mantenimiento de la salud despus de los 65 aos de edad Health Maintenance After Age 67 Despus de los 65 aos de edad, corre un riesgo mayor de padecer ciertas enfermedades e infecciones a largo plazo, como tambin de sufrir lesiones por cadas. Las cadas son la causa principal de las fracturas de huesos y lesiones en la cabeza de personas mayores de 65 aos de edad. Recibir cuidados preventivos de forma regular puede ayudarlo a mantenerse saludable y en buen estado. Los cuidados preventivos incluyen realizarse anlisis de forma regular y realizar cambios en el estilo de vida segn las recomendaciones del mdico. Converse con el mdico sobre lo siguiente: Las pruebas de deteccin y los anlisis que debe realizarse. Una prueba de deteccin es un estudio que se para detectar la presencia de una enfermedad cuando no tiene sntomas. Un plan de dieta y ejercicios adecuado para usted. Qu debo saber sobre las pruebas de deteccin y los anlisis para prevenir cadas? Realizarse pruebas de deteccin y anlisis es la mejor manera de detectar un problema de salud de forma temprana. El diagnstico y tratamiento tempranos le brindan la mejor oportunidad de controlar las afecciones mdicas que son comunes despus de los 65 aos de edad. Ciertas afecciones y elecciones de estilo de vida pueden hacer que sea ms propenso a sufrir una cada. El mdico puede recomendarle lo siguiente: Controles regulares de la visin. Una visin deficiente y afecciones como las cataratas pueden hacer que sea ms propenso a sufrir una cada. Si usa lentes, asegrese de obtener una receta actualizada si su visin cambia. Revisin de medicamentos. Revise regularmente con el mdico todos los medicamentos que toma, incluidos los medicamentos de venta libre. Consulte al mdico sobre los efectos secundarios que pueden hacer que sea ms propenso a sufrir una cada. Informe al mdico si alguno de los medicamentos que toma lo hace sentir mareado o  somnoliento. Pruebas de deteccin para la osteoporosis. La osteoporosis es una afeccin que hace que los huesos se vuelvan ms frgiles. En consecuencia, los huesos pueden debilitarse y quebrarse ms fcilmente. Pruebas de deteccin para la presin arterial. Los cambios en la presin arterial y los medicamentos para controlar la presin arterial pueden hacerlo sentir mareado. Controles de fuerza y equilibrio. El mdico puede recomendar ciertos estudios para controlar su fuerza y equilibrio al estar de pie, al caminar o al cambiar de posicin. Examen de los pies. El dolor y el adormecimiento en los pies, como tambin no utilizar el calzado adecuado, pueden hacer que sea ms propenso a sufrir una cada. Prueba de deteccin de la depresin. Es ms probable que sufra una cada si tiene miedo a caerse, se siente mal emocionalmente o se siente incapaz de realizar actividades que sola hacer. Prueba de deteccin de consumo de alcohol. Beber demasiado alcohol puede afectar su equilibrio y puede hacer que sea ms propenso a sufrir una cada. Qu medidas puedo tomar para reducir mi riesgo de sufrir una cada? Instrucciones generales Hable con el mdico sobre sus riesgos de sufrir una cada. Infrmele al mdico si: Se cae. Asegrese de informarle a su mdico acerca de todas las cadas, incluso aquellas que parecen ser menores. Se siente mareado, somnoliento o que pierde el equilibrio. Use los medicamentos de venta libre y los recetados solamente como se lo haya indicado el mdico. Estos incluyen todos los suplementos. Siga una dieta sana y mantenga un peso saludable. Una dieta saludable incluye productos lcteos descremados, carnes bajas en contenido de grasa (magras), fibra de granos enteros, frijoles y muchas   frutas y verduras. Seguridad en el hogar Retire los objetos que puedan causar tropiezos tales como alfombras, cables u obstculos. Instale equipos de seguridad, como barras para sostn en los baos y  barandas de seguridad en las escaleras. Mantenga las habitaciones y los pasillos bien iluminados. Actividad  Siga un programa de ejercicio regular para mantenerse en forma. Esto lo ayudar a mantener el equilibrio. Consulte al mdico qu tipos de ejercicios son adecuados para usted. Si necesita un bastn o un andador, selo segn las recomendaciones del mdico. Utilice calzado con buen apoyo y suela antideslizante. Estilo de vida No beba alcohol si el mdico se lo prohbe. Si bebe alcohol, limite la cantidad que consume: De 0 a 1 medida por da para las mujeres. De 0 a 2 medidas por da para los hombres. Est atento a la cantidad de alcohol que hay en las bebidas que toma. En los [place], una medida equivale a una botella tpica de cerveza (12 oz/355 ml), medio vaso de vino (5 oz/148 ml) o un trago de bebidas alcohlicas de alta graduacin (1 oz/45 ml). No consuma ningn producto que contenga nicotina o tabaco, como cigarrillos y cigarrillos electrnicos. Si necesita ayuda para dejar de consumir, consulte al mdico. Resumen Tener un estilo de vida saludable y recibir cuidados preventivos pueden ayudar a promover la salud y el bienestar despus de los 65 aos de edad. Realizarse pruebas de deteccin y anlisis es la mejor manera de detectar un problema de salud de forma temprana y ayudarlo a evitar una cada. El diagnstico y tratamiento tempranos le brindan la mejor oportunidad de controlar las afecciones mdicas ms comunes en las personas mayores de 65 aos de edad. Las cadas son la causa principal de las fracturas de huesos y lesiones en la cabeza de personas mayores de 65 aos de edad. Tome precauciones para evitar una cada en su casa. Trabaje con el mdico para saber qu cambios que puede hacer para mejorar su salud y bienestar, y para prevenir las cadas. Esta informacin no tiene como fin reemplazar el consejo del mdico. Asegrese de hacerle al mdico cualquier pregunta que  tenga. Document Revised: 02/04/2020 Document Reviewed: 02/04/2020 Elsevier Patient Education  2022 Elsevier Inc.  

## 2021-01-15 ENCOUNTER — Other Ambulatory Visit: Payer: Self-pay

## 2021-01-15 DIAGNOSIS — E785 Hyperlipidemia, unspecified: Secondary | ICD-10-CM

## 2021-01-16 LAB — AST: AST: 30 IU/L (ref 0–40)

## 2021-01-16 LAB — LIPID PANEL
Chol/HDL Ratio: 3.5 ratio (ref 0.0–4.4)
Cholesterol, Total: 186 mg/dL (ref 100–199)
HDL: 53 mg/dL (ref >39)
LDL Chol Calc (NIH): 119 mg/dL — ABNORMAL HIGH (ref 0–99)
Triglycerides: 73 mg/dL (ref 0–149)
VLDL Cholesterol Cal: 14 mg/dL (ref 5–40)

## 2021-01-16 LAB — ALT: ALT: 39 IU/L — ABNORMAL HIGH (ref 0–32)

## 2021-01-19 ENCOUNTER — Telehealth: Payer: Self-pay

## 2021-01-19 NOTE — Telephone Encounter (Signed)
Lm to return my call

## 2021-01-22 ENCOUNTER — Telehealth: Payer: Self-pay

## 2021-01-22 DIAGNOSIS — E785 Hyperlipidemia, unspecified: Secondary | ICD-10-CM

## 2021-01-22 MED ORDER — NEXLIZET 180-10 MG PO TABS: 1.0000 | ORAL_TABLET | Freq: Every day | ORAL | 1 refills | Status: DC

## 2021-01-22 NOTE — Telephone Encounter (Signed)
-----   Message from Park Liter, MD sent at 01/22/2021 10:17 AM EST ----- Nexlizet is one tablet daily ----- Message ----- From: Darrel Reach, CMA Sent: 01/20/2021   8:25 AM EST To: Park Liter, MD  Good morning, I am not clear on Nexlizet, can you tell me dose and instructions please. Thanks  ----- Message ----- From: Park Liter, MD Sent: 01/18/2021  10:59 AM EST To: Senaida Ores, RN  Cholesterol better but still not where it supposed to be.  Please stop her Zetia and at Nexlizet, fasting lipid profile HDL 36 weeks

## 2021-01-22 NOTE — Telephone Encounter (Signed)
Patient notified of results and recommendations, and agreed to plan. She is aware to stop Zetia and start Nexlizet qd. Medication sent, lab order on file, she is aware to be fasting for blood work.

## 2021-01-26 ENCOUNTER — Telehealth: Payer: Self-pay

## 2021-01-27 ENCOUNTER — Telehealth: Payer: Self-pay

## 2021-01-27 MED ORDER — NEXLETOL 180 MG PO TABS: 1.0000 | ORAL_TABLET | Freq: Every day | ORAL | 1 refills | Status: DC

## 2021-01-27 MED ORDER — EZETIMIBE 10 MG PO TABS: 10.0000 mg | ORAL_TABLET | Freq: Every day | ORAL | 1 refills | Status: DC

## 2021-01-27 NOTE — Telephone Encounter (Signed)
PA started on CMM for Nexletol 180 mg. Key [id number]

## 2021-01-27 NOTE — Addendum Note (Signed)
Addended by: Darrel Reach on: 01/27/2021 09:34 AM   Modules accepted: Orders

## 2021-01-27 NOTE — Telephone Encounter (Signed)
Patient is aware her insurance does not cover Nexlizet, per Dr. Raliegh Ip advise to send Nexletol180 qd and start back on Zetia 10mg  qd. Patient notified of changes.

## 2021-01-28 NOTE — Telephone Encounter (Signed)
PA approved for Nexletol 180 mg on CMM until 02/06/22.

## 2021-01-29 NOTE — Telephone Encounter (Signed)
LM to return my call. 

## 2021-01-29 NOTE — Telephone Encounter (Signed)
Patient notified of approval. 

## 2021-04-15 ENCOUNTER — Ambulatory Visit (INDEPENDENT_AMBULATORY_CARE_PROVIDER_SITE_OTHER): Payer: Medicare Other

## 2021-04-15 ENCOUNTER — Other Ambulatory Visit: Payer: Self-pay

## 2021-04-15 ENCOUNTER — Ambulatory Visit (INDEPENDENT_AMBULATORY_CARE_PROVIDER_SITE_OTHER): Payer: Medicare Other | Admitting: Emergency Medicine

## 2021-04-15 ENCOUNTER — Encounter: Payer: Self-pay | Admitting: Emergency Medicine

## 2021-04-15 VITALS — BP 118/62 | HR 57 | Temp 98.5°F | Ht 62.0 in | Wt 167.0 lb

## 2021-04-15 DIAGNOSIS — R109 Unspecified abdominal pain: Secondary | ICD-10-CM

## 2021-04-15 DIAGNOSIS — R7303 Prediabetes: Secondary | ICD-10-CM

## 2021-04-15 DIAGNOSIS — E785 Hyperlipidemia, unspecified: Secondary | ICD-10-CM

## 2021-04-15 LAB — HEMOGLOBIN A1C: Hgb A1c MFr Bld: 6.5 % (ref 4.6–6.5)

## 2021-04-15 LAB — LIPID PANEL
Cholesterol: 185 mg/dL (ref 0–200)
HDL: 57.1 mg/dL (ref >39.00)
LDL Cholesterol: 107 mg/dL — ABNORMAL HIGH (ref 0–99)
NonHDL: 128.39
Total CHOL/HDL Ratio: 3
Triglycerides: 105 mg/dL (ref 0.0–149.0)
VLDL: 21 mg/dL (ref 0.0–40.0)

## 2021-04-15 LAB — CBC WITH DIFFERENTIAL/PLATELET
Basophils Absolute: 0 10*3/uL (ref 0.0–0.1)
Basophils Relative: 0.5 % (ref 0.0–3.0)
Eosinophils Absolute: 0.2 10*3/uL (ref 0.0–0.7)
Eosinophils Relative: 2.4 % (ref 0.0–5.0)
HCT: 40.2 % (ref 36.0–46.0)
Hemoglobin: 12.8 g/dL (ref 12.0–15.0)
Lymphocytes Relative: 43.5 % (ref 12.0–46.0)
Lymphs Abs: 2.9 10*3/uL (ref 0.7–4.0)
MCHC: 31.8 g/dL (ref 30.0–36.0)
MCV: 83.6 fl (ref 78.0–100.0)
Monocytes Absolute: 0.6 10*3/uL (ref 0.1–1.0)
Monocytes Relative: 9.1 % (ref 3.0–12.0)
Neutro Abs: 3 10*3/uL (ref 1.4–7.7)
Neutrophils Relative %: 44.5 % (ref 43.0–77.0)
Platelets: 178 10*3/uL (ref 150.0–400.0)
RBC: 4.82 Mil/uL (ref 3.87–5.11)
RDW: 13.9 % (ref 11.5–15.5)
WBC: 6.8 10*3/uL (ref 4.0–10.5)

## 2021-04-15 LAB — COMPREHENSIVE METABOLIC PANEL
ALT: 28 U/L (ref 0–35)
AST: 24 U/L (ref 0–37)
Albumin: 4.6 g/dL (ref 3.5–5.2)
Alkaline Phosphatase: 59 U/L (ref 39–117)
BUN: 16 mg/dL (ref 6–23)
CO2: 33 mEq/L — ABNORMAL HIGH (ref 19–32)
Calcium: 9.6 mg/dL (ref 8.4–10.5)
Chloride: 102 mEq/L (ref 96–112)
Creatinine, Ser: 0.76 mg/dL (ref 0.40–1.20)
GFR: 80.83 mL/min (ref >60.00)
Glucose, Bld: 89 mg/dL (ref 70–99)
Potassium: 3.8 mEq/L (ref 3.5–5.1)
Sodium: 140 mEq/L (ref 135–145)
Total Bilirubin: 0.7 mg/dL (ref 0.2–1.2)
Total Protein: 7 g/dL (ref 6.0–8.3)

## 2021-04-15 LAB — URINALYSIS
Bilirubin Urine: NEGATIVE
Hgb urine dipstick: NEGATIVE
Ketones, ur: NEGATIVE
Leukocytes,Ua: NEGATIVE
Nitrite: NEGATIVE
Specific Gravity, Urine: 1.02 (ref 1.000–1.030)
Total Protein, Urine: NEGATIVE
Urine Glucose: NEGATIVE
Urobilinogen, UA: 0.2 (ref 0.0–1.0)
pH: 6.5 (ref 5.0–8.0)

## 2021-04-15 LAB — POCT GLYCOSYLATED HEMOGLOBIN (HGB A1C): Hemoglobin A1C: 6 % — AB (ref 4.0–5.6)

## 2021-04-15 MED ORDER — TRAMADOL HCL 50 MG PO TABS: 50.0000 mg | ORAL_TABLET | Freq: Three times a day (TID) | ORAL | 0 refills | Status: AC | PRN

## 2021-04-15 NOTE — Assessment & Plan Note (Signed)
Diet and nutrition discussed.  Advised to decrease amount of daily carbohydrate intake.  Hemoglobin A1c done today. ?

## 2021-04-15 NOTE — Progress Notes (Signed)
Christine Hawkins 66 y.o.   Chief Complaint  Patient presents with   Hypertension    F/u    HISTORY OF PRESENT ILLNESS: This is a 66 y.o. female with history of prediabetes and dyslipidemia here for follow-up Also complaining of right flank pain for the past 2 months. Tylenol not working. Has no urinary symptoms.  No hematuria. Moving bowels okay.  No loss of appetite or weight loss. Non-smoker. BP Readings from Last 3 Encounters:  04/15/21 118/62  12/07/20 118/70  11/03/20 140/72     Hypertension Pertinent negatives include no chest pain, headaches, neck pain, palpitations or shortness of breath.    Prior to Admission medications   Medication Sig Start Date End Date Taking? Authorizing Provider  Acetaminophen (TYLENOL PO) Take 600 mg by mouth as needed (pain). Unknown strength   Yes [provider]  aspirin EC 81 MG tablet Take 81 mg by mouth daily.   Yes [provider]  azelastine (OPTIVAR) 0.05 % ophthalmic solution Apply 1 drop to eye daily. 01/27/20  Yes [provider]  Bempedoic Acid (NEXLETOL) 180 MG TABS Take 1 tablet by mouth daily. 01/27/21  Yes Park Liter, MD  [facility] COENZYME Q10 PO Take 100 mg by mouth daily.   Yes [provider]  ezetimibe (ZETIA) 10 MG tablet Take 1 tablet (10 mg total) by mouth daily. 01/27/21 04/27/21 Yes Park Liter, MD  loratadine (CLARITIN) 10 MG tablet Take 10 mg by mouth daily.   Yes [provider]  Multiple Vitamins-Minerals (CENTRUM SILVER 50+WOMEN) TABS Take 1 tablet by mouth daily. Unknown strength   Yes [provider]  mupirocin ointment (BACTROBAN) 2 % Sig to affected area twice a day x 7 days. Patient taking differently: Apply 1 application. topically daily. Sig to affected area twice a day x 7 days. 03/18/19  Yes Finlee Milo, Ines Bloomer, MD  Omega-3 Fatty Acids (OMEGA 3 PO) Take 1 tablet by mouth daily. Unknown strength   Yes [provider]  PARoxetine (PAXIL)  20 MG tablet Take 2 tablets (40 mg total) by mouth daily. 11/26/20 04/15/21 Yes Danial Hlavac, Ines Bloomer, MD  rosuvastatin (CRESTOR) 40 MG tablet Take 1 tablet (40 mg total) by mouth daily. 11/03/20 04/15/21 Yes Park Liter, MD  triamcinolone (NASACORT) 55 MCG/ACT AERO nasal inhaler Place 2 sprays into the nose daily. 03/18/19  Yes Omolola Mittman, Ines Bloomer, MD  cyclobenzaprine (FLEXERIL) 10 MG tablet Take 1 tablet (10 mg total) by mouth 2 (two) times daily as needed for muscle spasms. Patient not taking: Reported on 04/15/2021 12/17/18   Dorie Rank, MD    Allergies  Allergen Reactions   Lactose Intolerance (Gi)    Other     Dish/laundry dish detergent and bleach-smell bothers patient and dust   Bupropion Nausea Only   Niaspan [Niacin] Rash    Itching    Patient Active Problem List   Diagnosis Date Noted   High cholesterol 11/02/2020   Osteopenia 09/01/2020   Depression 09/01/2020   Colon polyps 09/01/2020   Anxiety 09/01/2020   Trigger ring finger of right hand 07/07/2020   Chronic neck pain 11/07/2017   Bilateral carotid artery stenosis 05/11/2017   Insomnia due to other mental disorder 04/19/2016   Pre-diabetes 04/02/2015   Hyperlipidemia LDL goal <130 04/02/2015   Gastroesophageal reflux disease without esophagitis 04/02/2015   Intrinsic eczema 04/02/2015   Recurrent major depressive disorder, in full remission ([place]) 04/02/2015   Elevated glucose 01/30/2015   Elevated ALT measurement 01/30/2015  Carpal tunnel syndrome of right wrist 01/30/2015   Bilateral leg edema 01/30/2015   Status post cervical spinal arthrodesis 05/22/2011   Anemia due to blood loss 04/28/2011   Primary osteoarthritis involving multiple joints 12/01/2006   Dyslipidemia 05/12/2006   COLONIC POLYPS, BENIGN, HX OF 05/12/2006    Past Medical History:  Diagnosis Date   Anxiety    Colon polyps    Depression    GERD (gastroesophageal reflux disease)    High cholesterol    Osteopenia     Past Surgical  History:  Procedure Laterality Date   BREAST LUMPECTOMY Left    CERVICAL SPINE SURGERY      2013 has 8 screws    CESAREAN SECTION     COLONOSCOPY  08/26/2009   Normal colonoscopy up to cecum    LEFT HEART CATHETERIZATION WITH CORONARY ANGIOGRAM N/A 06/07/2012   Procedure: LEFT HEART CATHETERIZATION WITH CORONARY ANGIOGRAM;  Surgeon: Laverda Page, MD;  Location: [facility] C[facility] LAB;  Service: Cardiovascular;  Laterality: N/A;    Social History   Socioeconomic History   Marital status: Legally Separated    Spouse name: Not on file   Number of children: 2   Years of education: Not on file   Highest education level: Not on file  Occupational History   Occupation: retired  Tobacco Use   Smoking status: Never   Smokeless tobacco: Never  Vaping Use   Vaping Use: Never used  Substance and Sexual Activity   Alcohol use: Not Currently   Drug use: Not Currently   Sexual activity: Not Currently  Other Topics Concern   Not on file  Social History Narrative   Not on file   Social Determinants of Health   Financial Resource Strain: Not on file  Food Insecurity: Not on file  Transportation Needs: Not on file  Physical Activity: Not on file  Stress: Not on file  Social Connections: Not on file  Intimate Partner Violence: Not on file    Family History  Problem Relation Age of Onset   Heart disease Mother    Heart disease Father    Colon polyps Father    Kidney disease Maternal Grandmother    Stomach cancer Paternal Grandmother    Diabetes Paternal Grandmother    Esophageal cancer Neg Hx    Colon cancer Neg Hx    Rectal cancer Neg Hx      Review of Systems  Constitutional: Negative.  Negative for chills and fever.  HENT: Negative.  Negative for congestion and sore throat.   Respiratory: Negative.  Negative for cough and shortness of breath.   Cardiovascular: Negative.  Negative for chest pain and palpitations.  Gastrointestinal:  Negative for abdominal pain, blood in stool,  diarrhea, melena, nausea and vomiting.  Genitourinary:  Positive for flank pain. Negative for dysuria, hematuria and urgency.  Musculoskeletal:  Negative for myalgias and neck pain.  Skin: Negative.  Negative for rash.  Neurological: Negative.  Negative for dizziness and headaches.  All other systems reviewed and are negative.  Today's Vitals   04/15/21 1339  BP: 118/62  Pulse: (!) 57  Temp: 98.5 F (36.9 C)  TempSrc: Oral  SpO2: 97%  Weight: 167 lb (75.8 kg)  Height: '5\' 2"'$  (1.575 m)   Body mass index is 30.54 kg/m.  Physical Exam Vitals reviewed.  Constitutional:      Appearance: Normal appearance.  HENT:     Head: Normocephalic.     Mouth/Throat:     Mouth: Mucous  membranes are moist.     Pharynx: Oropharynx is clear.  Eyes:     Extraocular Movements: Extraocular movements intact.     Conjunctiva/sclera: Conjunctivae normal.     Pupils: Pupils are equal, round, and reactive to light.  Cardiovascular:     Rate and Rhythm: Normal rate and regular rhythm.     Pulses: Normal pulses.     Heart sounds: Normal heart sounds.  Pulmonary:     Effort: Pulmonary effort is normal.     Breath sounds: Rales (Dry crackles to right lower base) present.  Abdominal:     General: Bowel sounds are normal. There is no distension.     Palpations: Abdomen is soft.     Tenderness: There is no abdominal tenderness.  Musculoskeletal:     Cervical back: No tenderness.     Right lower leg: No edema.     Left lower leg: No edema.  Lymphadenopathy:     Cervical: No cervical adenopathy.  Skin:    General: Skin is warm and dry.  Neurological:     General: No focal deficit present.     Mental Status: She is alert and oriented to person, place, and time.  Psychiatric:        Mood and Affect: Mood normal.        Behavior: Behavior normal.   DG Chest 2 View  Result Date: 04/15/2021 CLINICAL DATA:  Flank pain EXAM: CHEST - 2 VIEW COMPARISON:  April 01, 2016 FINDINGS: No consolidation. No  visible pleural effusions or pneumothorax. Cardiomediastinal silhouette is similar prior. Partially imaged ACDF. Degenerative changes of the spine. Mild S-shaped thoracolumbar curvature. IMPRESSION: No evidence of acute cardiopulmonary disease. Electronically Signed   By: Margaretha Sheffield M.D.   On: 04/15/2021 14:39   DG Lumbar Spine 2-3 Views  Result Date: 04/15/2021 CLINICAL DATA:  Right flank pain and low back pain 2 months EXAM: LUMBAR SPINE - 2-3 VIEW COMPARISON:  12/17/2018 FINDINGS: Mild retrolisthesis L3-4.  Negative for fracture or pars defect Disc degeneration with disc space narrowing and posterior spurring L2-3 and L3-4 similar to the prior study. No acute skeletal abnormality. IMPRESSION: Disc degeneration L2-3 and L3-4.  No acute abnormality. Electronically Signed   By: Franchot Gallo M.D.   On: 04/15/2021 14:39    ASSESSMENT & PLAN: A total of 49 was spent with the patient and counseling/coordination of care regarding preparing for this visit, review of most recent office visit notes, review of most recent blood work results, review of multiple chronic medical problems and their management, review of today's x-rays, differential diagnosis of flank pain, need for kidney ultrasound, prognosis, documentation and need for follow-up.  Problem List Items Addressed This Visit       Other   Dyslipidemia    Diet and nutrition discussed.  Continue rosuvastatin 40 mg daily. The 10-year ASCVD risk score (Arnett DK, et al., 2019) is: 5.6%   Values used to calculate the score:     Age: 39 years     Sex: Female     Is Non-Hispanic African American: No     Diabetic: No     Tobacco smoker: No     Systolic Blood Pressure: 269 mmHg     Is BP treated: No     HDL Cholesterol: 57.1 mg/dL     Total Cholesterol: 185 mg/dL       Relevant Orders   Lipid panel (Completed)   Prediabetes    Diet and nutrition discussed.  Advised  to decrease amount of daily carbohydrate intake.  Hemoglobin A1c done  today.      Relevant Orders   POCT glycosylated hemoglobin (Hb A1C) (Completed)   Hemoglobin A1c (Completed)   Right flank pain - Primary    Differential diagnosis discussed.  Most likely musculoskeletal in nature. May take tramadol for severe pain.  Over-the-counter Tylenol not working. We will obtain renal ultrasound. Blood work done today.      Relevant Medications   traMADol (ULTRAM) 50 MG tablet   Other Relevant Orders   Urinalysis (Completed)   CBC with Differential/Platelet (Completed)   Comprehensive metabolic panel (Completed)   DG Chest 2 View (Completed)   DG Lumbar Spine 2-3 Views (Completed)   US Renal   Patient Instructions  Dolor en la dolor en la fosa lumbar en adultos Flank Pain, Adult El dolor en la fosa lumbar es aquel dolor que se siente en un lado del cuerpo. El flanco es la zona que se localiza en un lado del cuerpo, entre la parte superior del vientre (abdomen) y Tax adviser. El Mining engineer en un perodo corto de [place] o puede durar mucho tiempo o reaparecer con frecuencia (crnico). Puede ser leve o muy intenso. El dolor en esta zona puede tener diferentes causas. Siga estas instrucciones en su casa:  Beba suficiente lquido para mantener el pis (la orina) de color amarillo plido. Haga reposo como se lo haya indicado el mdico. Use los medicamentos de venta libre y los recetados solamente como se lo haya indicado el mdico. Realice un seguimiento por escrito de lo siguiente: Qu le caus dolor en la fosa lumbar. Qu ha hecho que Conservation officer, historic buildings en el flanco mejore. Concurra a [place]. Comunquese con un mdico si: Los medicamentos no Forensic psychologist. Aparecen nuevos sntomas. El dolor [place]. Los sntomas duran ms de 2 a 3 das. Tiene dificultad para orinar. Orina con ms frecuencia que lo normal. Solicite ayuda de inmediato si: Tiene dificultad para respirar. Le falta el aire. Le duele el vientre, o este  est hinchado o enrojecido. Tiene ganas de vomitar (nuseas). Vomita. Siente que podra desmayarse o se desmaya. Observa sangre en la orina. Tiene dolor en la fosa lumbar y fiebre. Estos sntomas pueden Sales executive. Solicite ayuda de inmediato. Comunquese con el servicio de emergencias de su localidad (911 en los [place]). No espere a ver si los sntomas desaparecen. No conduzca por sus propios medios [employer]. Resumen El dolor en la fosa lumbar es aquel dolor que se siente en un lado del cuerpo. El flanco es la zona que se localiza en un lado del cuerpo, entre la parte superior del vientre (abdomen) y Tax adviser. El dolor en la fosa lumbar puede aparecer en un perodo corto de tiempo (ag[place]) o puede durar mucho tiempo o reaparecer con frecuencia (crnico). Puede ser leve o muy intenso. El dolor en esta zona puede tener diferentes causas. Comunquese con su mdico si los sntomas empeoran o si duran ms de 2 a 3 das. Esta informacin no tiene Marine scientist el consejo del mdico. Asegrese de hacerle al mdico cualquier pregunta que tenga. Document Revised: 04/28/2020 Document Reviewed: 04/28/2020 Elsevier Patient Education  2022 [place] Dezza, MD [facility]

## 2021-04-15 NOTE — Patient Instructions (Signed)
Dolor en la dolor en la fosa lumbar en adultos ?Flank Pain, Adult ?El dolor en la fosa lumbar es aquel dolor que se siente en un lado del cuerpo. El flanco es la zona que se localiza en un lado del cuerpo, entre la parte superior del vientre (abdomen) y Tax adviser. El Mining engineer en un per?odo corto de [place] ([place]) o puede durar mucho tiempo o reaparecer con frecuencia (cr?nico). Puede ser leve o muy intenso. El dolor en esta zona puede tener diferentes causas. ?Siga estas instrucciones en su casa: ? ?Beba suficiente l?quido para mantener el pis (la orina) de color amarillo p?lido. ?Haga reposo como se lo haya indicado el m?dico. ?Use los medicamentos de venta libre y los recetados solamente como se lo haya indicado el m?dico. ?Realice un seguimiento por escrito de lo siguiente: ?Qu? le caus? dolor en la fosa lumbar. ?Qu? ha hecho que Conservation officer, historic buildings en el flanco mejore. ?Concurra a [place]. ?Comun?quese con un m?dico si: ?Los medicamentos no Forensic psychologist. ?Aparecen nuevos s?ntomas. ?El [place] representative. ?Los s?ntomas duran m?s de 2 a 3 d?as. ?Tiene dificultad para orinar. ?Orina con m?s frecuencia que lo normal. ?Solicite ayuda de inmediato si: ?Tiene dificultad para respirar. ?Le falta el aire. ?Le duele el vientre, o este est? hinchado o enrojecido. ?Tiene ganas de vomitar (n?useas). ?Vomita. ?Siente que podr?a desmayarse o se desmaya. ?Observa sangre en la orina. ?Tiene dolor en la fosa lumbar y fiebre. ?Estos s?ntomas pueden Sales executive. Solicite ayuda de inmediato. Comun?quese con el servicio de emergencias de su localidad (911 en [place]). ?No espere a ver si los s?ntomas desaparecen. ?No conduzca por sus propios medios [employer]. ?Resumen ?El dolor en la fosa lumbar es aquel dolor que se siente en un lado del cuerpo. El flanco es la zona que se localiza en un lado del cuerpo, entre la parte superior del vientre (abdomen) y Tax adviser. ?El dolor  en la fosa lumbar puede aparecer en un per?odo corto de tiempo (agudo) o puede durar mucho tiempo o reaparecer con frecuencia (cr?nico). Puede ser leve o muy intenso. ?El Barrister's clerk zona puede tener diferentes causas. ?Comun?quese con su m?dico si los s?ntomas empeoran o si duran m?s de 2 a 3 d?as. ?Esta informaci?n no tiene Marine scientist el consejo del m?dico. Aseg?rese de hacerle al m?dico cualquier pregunta que tenga. ?Document Revised: 04/28/2020 Document Reviewed: 04/28/2020 ?Elsevier Patient Education ? [place]. ? ?

## 2021-04-15 NOTE — Assessment & Plan Note (Signed)
Differential diagnosis discussed.  Most likely musculoskeletal in nature. ?May take tramadol for severe pain.  Over-the-counter Tylenol not working. ?We will obtain renal ultrasound. ?Blood work done today. ?

## 2021-04-15 NOTE — Assessment & Plan Note (Signed)
Diet and nutrition discussed.  Continue rosuvastatin 40 mg daily. ?The 10-year ASCVD risk score (Arnett DK, et al., 2019) is: 5.6% ?  Values used to calculate the score: ?    Age: 66 years ?    Sex: Female ?    Is Non-Hispanic African American: No ?    Diabetic: No ?    Tobacco smoker: No ?    Systolic Blood Pressure: 842 mmHg ?    Is BP treated: No ?    HDL Cholesterol: 57.1 mg/dL ?    Total Cholesterol: 185 mg/dL ? ?

## 2021-05-13 ENCOUNTER — Ambulatory Visit
Admission: RE | Admit: 2021-05-13 | Discharge: 2021-05-13 | Disposition: A | Discharge status: Home / Self Care | Payer: Medicare Other | Source: Ambulatory Visit | Attending: Emergency Medicine | Admitting: Emergency Medicine

## 2021-05-13 DIAGNOSIS — R109 Unspecified abdominal pain: Secondary | ICD-10-CM

## 2021-06-07 ENCOUNTER — Ambulatory Visit: Payer: Medicare Other | Admitting: Emergency Medicine

## 2021-06-14 ENCOUNTER — Ambulatory Visit: Payer: Medicare Other | Admitting: Dermatology

## 2021-07-14 ENCOUNTER — Other Ambulatory Visit: Payer: Self-pay | Admitting: Cardiology

## 2021-08-02 ENCOUNTER — Encounter: Payer: Self-pay | Admitting: Cardiology

## 2021-08-02 ENCOUNTER — Ambulatory Visit (INDEPENDENT_AMBULATORY_CARE_PROVIDER_SITE_OTHER): Payer: Medicare Other | Admitting: Cardiology

## 2021-08-02 VITALS — BP 124/82 | HR 60 | Ht 62.75 in | Wt 166.8 lb

## 2021-08-02 DIAGNOSIS — I6523 Occlusion and stenosis of bilateral carotid arteries: Secondary | ICD-10-CM

## 2021-08-02 DIAGNOSIS — E785 Hyperlipidemia, unspecified: Secondary | ICD-10-CM

## 2021-08-02 DIAGNOSIS — R7303 Prediabetes: Secondary | ICD-10-CM

## 2021-08-12 LAB — LIPID PANEL
Chol/HDL Ratio: 7.1 ratio — ABNORMAL HIGH (ref 0.0–4.4)
Cholesterol, Total: 398 mg/dL — ABNORMAL HIGH (ref 100–199)
HDL: 56 mg/dL (ref >39)
LDL Chol Calc (NIH): 318 mg/dL — ABNORMAL HIGH (ref 0–99)
Triglycerides: 131 mg/dL (ref 0–149)
VLDL Cholesterol Cal: 24 mg/dL (ref 5–40)

## 2021-08-20 ENCOUNTER — Telehealth: Payer: Self-pay

## 2021-08-20 DIAGNOSIS — E785 Hyperlipidemia, unspecified: Secondary | ICD-10-CM

## 2021-08-20 MED ORDER — EZETIMIBE 10 MG PO TABS: 10.0000 mg | ORAL_TABLET | Freq: Every day | ORAL | 1 refills | Status: DC

## 2021-08-20 NOTE — Telephone Encounter (Signed)
-----   Message from Park Liter, MD sent at 08/19/2021  3:53 PM EDT ----- Arloa Koh add Zetia 10 mg daily and fasting lipid profile is TLT 6 weeks ----- Message ----- From: Darrel Reach, CMA Sent: 08/13/2021   5:04 PM EDT To: Park Liter, MD  Patient report she is on Crestor 40. Do you want to increase this? ----- Message ----- From: Park Liter, MD Sent: 08/13/2021  10:41 AM EDT To: Tyler Pita, RN  Her cholesterol is extremely elevated.  Her LDL is 318!Marland Kitchen  Total cholesterol 398!,  She needs to be taking cholesterol medication I would recommend starting Crestor 40.  Fasting lipid profile, AST LT 6 weeks

## 2021-08-23 ENCOUNTER — Ambulatory Visit (INDEPENDENT_AMBULATORY_CARE_PROVIDER_SITE_OTHER): Payer: Medicare Other

## 2021-08-23 DIAGNOSIS — I6523 Occlusion and stenosis of bilateral carotid arteries: Secondary | ICD-10-CM

## 2021-08-24 ENCOUNTER — Ambulatory Visit (INDEPENDENT_AMBULATORY_CARE_PROVIDER_SITE_OTHER): Payer: Medicare Other | Admitting: Emergency Medicine

## 2021-08-24 ENCOUNTER — Encounter: Payer: Self-pay | Admitting: Emergency Medicine

## 2021-08-24 ENCOUNTER — Ambulatory Visit (INDEPENDENT_AMBULATORY_CARE_PROVIDER_SITE_OTHER): Payer: Medicare Other

## 2021-08-24 VITALS — BP 120/72 | HR 60 | Temp 97.7°F | Ht 62.75 in | Wt 167.5 lb

## 2021-08-24 DIAGNOSIS — R7303 Prediabetes: Secondary | ICD-10-CM | POA: Diagnosis not present

## 2021-08-24 DIAGNOSIS — E785 Hyperlipidemia, unspecified: Secondary | ICD-10-CM | POA: Diagnosis not present

## 2021-08-24 DIAGNOSIS — M79672 Pain in left foot: Secondary | ICD-10-CM

## 2021-08-24 MED ORDER — ROSUVASTATIN CALCIUM 40 MG PO TABS: 40.0000 mg | ORAL_TABLET | Freq: Every day | ORAL | 3 refills | Status: DC

## 2021-08-24 NOTE — Assessment & Plan Note (Signed)
Stable.  Diet and nutrition discussed. Continue rosuvastatin 40 mg daily. 

## 2021-08-24 NOTE — Progress Notes (Signed)
Christine Hawkins 66 y.o.   Chief Complaint  Patient presents with   Leg Pain    Lower leg pain, from bottom of foot to lower calf    HISTORY OF PRESENT ILLNESS: This is a 66 y.o. female complaining of pain to left foot that started couple weeks ago. Sharp pain that starts in the foot and sometimes radiates to the ankle and lower calf area. At times she also feels it in the left lumbar region.  No swelling or erythema.  Denies injuries.  Denies any other associated symptoms.  HPI   Prior to Admission medications   Medication Sig Start Date End Date Taking? Authorizing Provider  Acetaminophen (TYLENOL PO) Take 600 mg by mouth as needed (pain). Unknown strength   Yes [provider]  aspirin EC 81 MG tablet Take 81 mg by mouth daily.   Yes [provider]  azelastine (OPTIVAR) 0.05 % ophthalmic solution Apply 1 drop to eye daily. 01/27/20  Yes [provider]  ezetimibe (ZETIA) 10 MG tablet Take 1 tablet (10 mg total) by mouth daily. 08/20/21 11/18/21 Yes Park Liter, MD  loratadine (CLARITIN) 10 MG tablet Take 10 mg by mouth daily.   Yes [provider]  triamcinolone (NASACORT) 55 MCG/ACT AERO nasal inhaler Place 2 sprays into the nose daily. 03/18/19  Yes Alvino Lechuga, Ines Bloomer, MD  [employer] COENZYME Q10 PO Take 100 mg by mouth daily.    [provider]  Multiple Vitamins-Minerals (CENTRUM SILVER 50+WOMEN) TABS Take 1 tablet by mouth daily. Unknown strength Patient not taking: Reported on 08/24/2021    [provider]  mupirocin ointment (BACTROBAN) 2 % Sig to affected area twice a day x 7 days. Patient not taking: Reported on 08/24/2021 03/18/19   Horald Pollen, MD  Omega-3 Fatty Acids (OMEGA 3 PO) Take 1 tablet by mouth daily. Unknown strength    [provider]  rosuvastatin (CRESTOR) 40 MG tablet Take 1 tablet (40 mg total) by mouth daily. 08/24/21 08/19/22  Horald Pollen, MD    Allergies  Allergen  Reactions   Lactose Intolerance (Gi)    Other     Dish/laundry dish detergent and bleach-smell bothers patient and dust   Bupropion Nausea Only   Niaspan [Niacin] Rash    Itching    Patient Active Problem List   Diagnosis Date Noted   Right flank pain 04/15/2021   High cholesterol 11/02/2020   Osteopenia 09/01/2020   Depression 09/01/2020   Colon polyps 09/01/2020   Anxiety 09/01/2020   Trigger ring finger of right hand 07/07/2020   Chronic neck pain 11/07/2017   Bilateral carotid artery stenosis 05/11/2017   Insomnia due to other mental disorder 04/19/2016   Prediabetes 04/02/2015   Hyperlipidemia LDL goal <130 04/02/2015   Gastroesophageal reflux disease without esophagitis 04/02/2015   Intrinsic eczema 04/02/2015   Recurrent major depressive disorder, in full remission ([place]) 04/02/2015   Elevated glucose 01/30/2015   Elevated ALT measurement 01/30/2015   Carpal tunnel syndrome of right wrist 01/30/2015   Bilateral leg edema 01/30/2015   Status post cervical spinal arthrodesis 05/22/2011   Anemia due to blood loss 04/28/2011   Primary osteoarthritis involving multiple joints 12/01/2006   Dyslipidemia 05/12/2006   COLONIC POLYPS, BENIGN, HX OF 05/12/2006    Past Medical History:  Diagnosis Date   Anxiety    Colon polyps    Depression    GERD (gastroesophageal reflux disease)    High cholesterol    Osteopenia  Past Surgical History:  Procedure Laterality Date   BREAST LUMPECTOMY Left    CERVICAL SPINE SURGERY      2013 has 8 screws    CESAREAN SECTION     COLONOSCOPY  08/26/2009   Normal colonoscopy up to cecum    LEFT HEART CATHETERIZATION WITH CORONARY ANGIOGRAM N/A 06/07/2012   Procedure: LEFT HEART CATHETERIZATION WITH CORONARY ANGIOGRAM;  Surgeon: Laverda Page, MD;  Location[facility] C[facility] LAB;  Service: Cardiovascular;  Laterality: N/A;    Social History   Socioeconomic History   Marital status: Legally Separated    Spouse name: Not on file    Number of children: 2   Years of education: Not on file   Highest education level: Not on file  Occupational History   Occupation: retired  Tobacco Use   Smoking status: Never   Smokeless tobacco: Never  Vaping Use   Vaping Use: Never used  Substance and Sexual Activity   Alcohol use: Not Currently   Drug use: Not Currently   Sexual activity: Not Currently  Other Topics Concern   Not on file  Social History Narrative   Not on file   Social Determinants of Health   Financial Resource Strain: Not on file  Food Insecurity: Not on file  Transportation Needs: Not on file  Physical Activity: Not on file  Stress: Not on file  Social Connections: Not on file  Intimate Partner Violence: Not on file    Family History  Problem Relation Age of Onset   Heart disease Mother    Heart disease Father    Colon polyps Father    Kidney disease Maternal Grandmother    Stomach cancer Paternal Grandmother    Diabetes Paternal Grandmother    Esophageal cancer Neg Hx    Colon cancer Neg Hx    Rectal cancer Neg Hx      Review of Systems  Constitutional: Negative.  Negative for chills and fever.  HENT: Negative.  Negative for congestion and sore throat.   Respiratory: Negative.  Negative for cough.   Gastrointestinal:  Negative for abdominal pain, nausea and vomiting.  Genitourinary: Negative.   Skin: Negative.  Negative for rash.  Neurological: Negative.  Negative for dizziness and headaches.  All other systems reviewed and are negative.   Today's Vitals   08/24/21 0929  BP: 120/72  Pulse: 60  Temp: 97.7 F (36.5 C)  TempSrc: Oral  SpO2: 97%  Weight: 167 lb 8 oz (76 kg)  Height: 5' 2.75" (1.594 m)   Body mass index is 29.91 kg/m. Wt Readings from Last 3 Encounters:  08/24/21 167 lb 8 oz (76 kg)  08/02/21 166 lb 12.8 oz (75.7 kg)  04/15/21 167 lb (75.8 kg)    Physical Exam Vitals reviewed.  Constitutional:      Appearance: Normal appearance.  HENT:     Head:  Normocephalic.  Eyes:     Extraocular Movements: Extraocular movements intact.  Cardiovascular:     Rate and Rhythm: Normal rate.  Pulmonary:     Effort: Pulmonary effort is normal.  Abdominal:     Palpations: Abdomen is soft.     Tenderness: There is no abdominal tenderness.  Musculoskeletal:        General: Normal range of motion.     Lumbar back: Normal. No spasms, tenderness or bony tenderness. Negative right straight leg raise test and negative left straight leg raise test.     Comments: Left lower extremity: No hip tenderness.  Normal  knee and ankle. Left foot: No erythema or ecchymosis.  Excellent peripheral pulses and capillary refill.  No swelling or significant tenderness.  No abnormal findings.  Skin:    General: Skin is warm and dry.     Capillary Refill: Capillary refill takes less than 2 seconds.  Neurological:     General: No focal deficit present.     Mental Status: She is alert and oriented to person, place, and time.  Psychiatric:        Mood and Affect: Mood normal.        Behavior: Behavior normal.    DG Foot Complete Left  Result Date: 08/24/2021 CLINICAL DATA:  Pain EXAM: LEFT FOOT - COMPLETE 3+ VIEW COMPARISON:  None Available. FINDINGS: No fracture or dislocation is seen. Degenerative changes are noted in first metatarsophalangeal joint. Small plantar spur is seen in calcaneus. IMPRESSION: No fracture or dislocation is seen. Moderate degenerative changes are noted in left first metatarsophalangeal joint. Small plantar spur is seen in calcaneus. Electronically Signed   By: Elmer Picker M.D.   On: 08/24/2021 10:05     ASSESSMENT & PLAN: Problem List Items Addressed This Visit       Other   Dyslipidemia    Stable.  Diet and nutrition discussed. Continue rosuvastatin 40 mg daily       Relevant Medications   rosuvastatin (CRESTOR) 40 MG tablet   Prediabetes    Diet and nutrition discussed.  Advised to decrease amount of daily carbohydrate intake  and daily calories.      Left foot pain - Primary    Differential diagnosis discussed. Calcaneal spur noted on x-ray. May take Tylenol and or Advil as needed for pain Needs evaluation by podiatrist. Referral placed today.      Relevant Orders   Ambulatory referral to Podiatry   DG Foot Complete Left (Completed)   Patient Instructions  Dolor del pie Foot Pain El dolor del pie puede tener muchas causas. Algunas causas frecuentes son las siguientes: Una lesin. Un esguince. Artritis. Ampollas. Juanetes. Siga estas instrucciones en su casa: Control del dolor, la rigidez y la hinchazn Si se lo indican, aplique hielo sobre la zona del dolor: Ponga el hielo en una bolsa plstica. Coloque una toalla entre la piel y Therapist, nutritional. Aplique el hielo durante 20 minutos, 2 o 3 veces por da.  Actividad No permanezca de pie ni camine durante largos perodos. Retome sus actividades normales segn lo indicado por el mdico. Pregntele al mdico qu actividades son seguras para usted. Haga ejercicios de estiramiento para Best boy del pie y la rigidez como se lo haya indicado el mdico. No levante ningn objeto que pese ms de 10 libras (4.5 kg) o que supere el lmite de peso que le hayan indicado, Nurse, children's que el mdico le diga que puede [place]. Levantar mucho peso puede ejercer presin [employer]. Estilo de vida Use zapatos cmodos y anatmicos que tengan buen calce. No use zapatos con tacones altos. Mantenga los pies limpios y secos. Instrucciones generales Use los medicamentos de venta libre y los recetados solamente como se lo haya indicado el mdico. Hgase masajes suaves en el pie. Est atento a cualquier cambio en los sntomas. Concurra a todas las visitas de seguimiento como se lo haya indicado el mdico. Esto es importante. Comunquese con un mdico si: El dolor no mejora despus de unos das de cuidados personales. El dolor [place]. No puede apoyar el peso en  el pie. Solicite [employer]  de inmediato si: El pie se le adormece o tiene hormigueo. El pie o los dedos de ese pie se le hinchan. El pie o los dedos de ese pie se tornan de color blanco o [place]. Tiene el pie enrojecido y caliente al tacto. Resumen Las causas frecuentes del dolor de pie son Ardelia Mems lesin, un esguince, artritis, ampollas o juanetes. El hielo, los medicamentos y los zapatos cmodos pueden Best boy. Comunquese con el mdico si el dolor no mejora despus de unos das de cuidados personales. Esta informacin no tiene Marine scientist el consejo del mdico. Asegrese de hacerle al mdico cualquier pregunta que tenga. Document Revised: 05/26/2020 Document Reviewed: 05/26/2020 Elsevier Patient Education  [place], MD [facility]

## 2021-08-24 NOTE — Patient Instructions (Signed)
Dolor del pie Foot Pain El dolor del pie puede tener muchas causas. Algunas causas frecuentes son las siguientes: Una lesin. Un esguince. Artritis. Ampollas. Juanetes. Siga estas instrucciones en su casa: Control del dolor, la rigidez y la hinchazn Si se lo indican, aplique hielo sobre la zona del dolor: Ponga el hielo en una bolsa plstica. Coloque una toalla entre la piel y Therapist, nutritional. Aplique el hielo durante 20 minutos, 2 o 3 veces por da.  Actividad No permanezca de pie ni camine durante largos perodos. Retome sus actividades normales segn lo indicado por el mdico. Pregntele al mdico qu actividades son seguras para usted. Haga ejercicios de estiramiento para Best boy del pie y la rigidez como se lo haya indicado el mdico. No levante ningn objeto que pese ms de 10 libras (4.5 kg) o que supere el lmite de peso que le hayan indicado, Nurse, children's que el mdico le diga que puede [place]. Levantar mucho peso puede ejercer presin [employer]. Estilo de vida Use zapatos cmodos y anatmicos que tengan buen calce. No use zapatos con tacones altos. Mantenga los pies limpios y secos. Instrucciones generales Use los medicamentos de venta libre y los recetados solamente como se lo haya indicado el mdico. Hgase masajes suaves en el pie. Est atento a cualquier cambio en los sntomas. Concurra a todas las visitas de seguimiento como se lo haya indicado el mdico. Esto es importante. Comunquese con un mdico si: El dolor no mejora despus de unos das de cuidados personales. El dolor [place]. No puede apoyar el peso en el pie. Solicite ayuda de inmediato si: El pie se le adormece o tiene hormigueo. El pie o los dedos de ese pie se le hinchan. El pie o los dedos de ese pie se tornan de color blanco o [place]. Tiene el pie enrojecido y caliente al tacto. Resumen Las causas frecuentes del dolor de pie son Ardelia Mems lesin, un esguince, artritis, ampollas o juanetes. El  hielo, los medicamentos y los zapatos cmodos pueden Best boy. Comunquese con el mdico si el dolor no mejora despus de unos das de cuidados personales. Esta informacin no tiene Marine scientist el consejo del mdico. Asegrese de hacerle al mdico cualquier pregunta que tenga. Document Revised: 05/26/2020 Document Reviewed: 05/26/2020 Elsevier Patient Education  [place].

## 2021-08-24 NOTE — Assessment & Plan Note (Signed)
Differential diagnosis discussed. Calcaneal spur noted on x-ray. May take Tylenol and or Advil as needed for pain Needs evaluation by podiatrist. Referral placed today.

## 2021-08-24 NOTE — Assessment & Plan Note (Signed)
Diet and nutrition discussed.  Advised to decrease amount of daily carbohydrate intake and daily calories.

## 2021-08-26 ENCOUNTER — Telehealth: Payer: Self-pay

## 2021-08-26 NOTE — Telephone Encounter (Signed)
Left message on My Chart per Dr. Joya Gaskins note.

## 2021-08-31 ENCOUNTER — Encounter: Payer: Self-pay | Admitting: Emergency Medicine

## 2021-09-07 ENCOUNTER — Ambulatory Visit (INDEPENDENT_AMBULATORY_CARE_PROVIDER_SITE_OTHER): Payer: Medicare Other | Admitting: Podiatry

## 2021-09-07 DIAGNOSIS — M722 Plantar fascial fibromatosis: Secondary | ICD-10-CM | POA: Diagnosis not present

## 2021-09-07 DIAGNOSIS — M7752 Other enthesopathy of left foot: Secondary | ICD-10-CM | POA: Diagnosis not present

## 2021-09-07 DIAGNOSIS — G5792 Unspecified mononeuropathy of left lower limb: Secondary | ICD-10-CM

## 2021-09-07 DIAGNOSIS — M7750 Other enthesopathy of unspecified foot: Secondary | ICD-10-CM

## 2021-09-07 MED ORDER — METHYLPREDNISOLONE 4 MG PO TBPK: ORAL_TABLET | ORAL | 0 refills | Status: DC

## 2021-09-07 NOTE — Patient Instructions (Signed)
For instructions on how to put on your Plantar Fascial Brace, please visit [email or web address]   Fascitis plantar, rehabilitacin Plantar Fasciitis Rehab Pregunte al mdico qu ejercicios son seguros para usted. Haga los ejercicios exactamente como se lo haya indicado el mdico y gradelos como se lo hayan indicado. Es normal sentir un estiramiento leve, tironeo, opresin o Tree surgeon al [employer] [place]. Detngase de inmediato si siente un dolor repentino o Print[place]. No comience a hacer estos ejercicios hasta que se lo indique el mdico. Ejercicios de elongacin y amplitud de movimiento Estos ejercicios calientan los msculos y las articulaciones, y mejoran la movilidad y la flexibilidad del pie. Adems, ayudan a Best boy. Estiramiento de la fascia plantar  Sintese con la pierna izquierda/derecha cruzada sobre la rodilla [place]. Sostenga el taln con una mano, con el pulgar cerca del arco. Con la otra [place], sostenga los dedos de los pies y empjelos con [place]. Debe sentir un estiramiento en la base (la parte de abajo) de los dedos o en la parte de abajo del pie (fascia plantar), o en ambos. Mantenga esta posicin durante _________ segundos. Afloje lentamente los dedos y vuelva a la posicin inicial. Repita __________ veces. Realice este ejercicio __________ veces al da. Estiramiento de los gemelos, de pie Este ejercicio tambin se denomina estiramiento de la pantorrilla (los msculos gemelos). Estira los msculos posteriores de la parte superior de la pantorrilla. Prese con las [employer] pared. Extienda la pierna izquierda/derecha hacia atrs y flexione ligeramente la rodilla de la pierna de adelante. Mantenga los talones apoyados en el suelo, los dedos apuntando hacia delante y la rodilla de atrs extendida, y lleve el peso hacia la pared. No arquee la espalda. Debe sentir un ligero estiramiento en la parte superior de la  pantorrilla. Mantenga esta posicin durante __________ segundos. Repita __________ veces. Realice este ejercicio __________ veces al da. Estiramiento del msculo sleo, de pie Este ejercicio tambin se denomina estiramiento de la pantorrilla (sleo). Estira los msculos posteriores de la parte inferior de la pantorrilla. Prese con las [employer] pared. Extienda la pierna izquierda/derecha hacia atrs y flexione ligeramente la rodilla de la pierna de adelante. Mantenga los talones apoyados en el suelo y los dedos apuntando hacia delante, flexione la rodilla de atrs y lleve el peso ligeramente a la pierna de atrs. Debe sentir un estiramiento suave en la parte profunda de la parte inferior de la pantorrilla. Mantenga esta posicin durante __________ segundos. Repita __________ veces. Realice este ejercicio __________ veces al da. Estiramiento de los [employer] gemelos y sleo, de pie con un escaln Este ejercicio estira los msculos posteriores de la parte inferior de la pierna. Estos msculos se encuentran en la parte superior de la pantorrilla (gastrocnemio) y la parte inferior de la pantorrilla (sleo). Prese delante de un escaln apoyando solo la regin metatarsiana de su pie derecho/izquierdo. La regin metatarsiana del pie es la superficie sobre la que caminamos, justo debajo de los dedos. Mantenga el otro pie apoyado con firmeza en el mismo escaln. Sostngase de la pared o de una baranda para mantener el equilibrio. Levante lentamente el [employer], y permita que el peso del cuerpo presione el taln sobre el borde del frente del escaln. Mantenga la rodilla recta y sin doblar. Debe sentir un estiramiento en la pantorrilla. Mantenga esta posicin durante __________ segundos. Vuelva a poner ambos pies sobre el escaln. Repita este ejercicio con una leve flexin en la rodilla izquierda/derecha. Reptalo __________ Vicenta Aly  con la rodilla izquierda/derecha extendida y __________ veces  con la rodilla izquierda/derecha flexionada. Realice este ejercicio __________ veces al da. Ejercicio de equilibrio Este ejercicio aumenta el equilibrio y el control de la fuerza del arco, para ayudar a reducir la presin sobre la fascia plantar. Pararse sobre una pierna Si este ejercicio es muy fcil, puede intentar hacerlo con los ojos cerrados o parado sobre [place]. Sin calzado, prese cerca de una baranda o [place]. Puede sostenerse de la baranda o del marco de la puerta, segn lo necesite. Prese sobre el pie izquierdo/derecho. Sin despegar el dedo [place] del suelo, levante el arco del pie. Debe sentir un estiramiento en la parte de abajo del pie y el arco. No deje que el pie se vaya hacia adentro. Mantenga esta posicin durante __________ segundos. Repita __________ veces. Realice este ejercicio __________ veces al da. Esta informacin no tiene Marine scientist el consejo del mdico. Asegrese de hacerle al mdico cualquier pregunta que tenga. Document Revised: 12/06/2019 Document Reviewed: 12/06/2019 Elsevier Patient Education  [place].

## 2021-09-07 NOTE — Progress Notes (Signed)
Subjective:   Patient ID: Christine Hawkins, female   DOB: 66 y.o.   MRN: [id number]   HPI Chief Complaint  Patient presents with   Foot Problem    Left foot pain that starts on her ball of her feet and radiates to left ankle- numbness sensation and achiness. No injuries. Worst after standing for long period of time.     Interpreter number [id number]   66 year old female presents with the above complaints.  She states this started stared with pain to the ball of the foot and then travels to the outside of the ankle. This started about 3 weeks ago. She saw her PCP on 7/18. No injury. She gets a tingling sensation is all the time but the pain comes and goes. She also gets back pain and the pain does radiate. No recent treatment for back. Some swelling.   She previously had an injection on the bottom of her heel for likely plantar fasciits a few years.    Review of Systems  All other systems reviewed and are negative.       Objective:  Physical Exam  General: AAO x3, NAD  Dermatological: Skin is warm, dry and supple bilateral.  There are no open sores, no preulcerative lesions, no rash or signs of infection present.  Vascular: Dorsalis Pedis artery and Posterior Tibial artery pedal pulses are 2/4 bilateral with immedate capillary fill time.  There is no pain with calf compression, swelling, warmth, erythema.   Neruologic: Grossly intact via light touch bilateral.  Negative Tinel sign.  Musculoskeletal: There is tenderness palpation on plantar medial tubercle of the calcaneus at the insertion of plantar fascia as well as the arch of the foot.  Not a to elicit any area pinpoint tenderness.  There is pain on the left first MPJ there is decreased range of motion of first MPJ.  Mother called the peroneal tendon distally but not able to identify any lateral discomfort.  No ankle instability is present.  Muscular strength 5/5 in all groups tested bilateral.  Gait: Unassisted, Nonantalgic.    Assessment:   66 year old female with plantar fasciitis, tendinitis, arthritis     Plan:  -Treatment options discussed including all alternatives, risks, and complications -Etiology of symptoms were discussed -Independent reviewed the x-rays that were performed on August 24, 2021.  I reviewed them with the patient as well. -Prescribed Medrol Dosepak. -Plantar fascia was dispensed to help support stabilize the plantar fascia. -Discussed shoe modifications and arch support -I do think some of the symptoms are nerve related possibly coming from her back.  Recommend follow-up with her primary care doctor for this.  Trula Slade DPM

## 2021-10-19 ENCOUNTER — Ambulatory Visit (INDEPENDENT_AMBULATORY_CARE_PROVIDER_SITE_OTHER): Payer: Medicare Other | Admitting: Podiatry

## 2021-10-19 DIAGNOSIS — G5792 Unspecified mononeuropathy of left lower limb: Secondary | ICD-10-CM

## 2021-10-19 DIAGNOSIS — M7752 Other enthesopathy of left foot: Secondary | ICD-10-CM

## 2021-10-19 DIAGNOSIS — M722 Plantar fascial fibromatosis: Secondary | ICD-10-CM | POA: Diagnosis not present

## 2021-10-19 MED ORDER — GABAPENTIN 100 MG PO CAPS: 100.0000 mg | ORAL_CAPSULE | Freq: Every day | ORAL | 0 refills | Status: AC

## 2021-10-19 NOTE — Progress Notes (Signed)
Subjective: Chief Complaint  Patient presents with   Plantar Fasciitis    Left heel and lateral ankle pain, Rate of pain  8 out of 10,  TX: Brace(unable to use), Did not take the medrol dosepak,Resting,  patient states she is doing much better    66 year old female presents for above concerns.  She is getting pain in the bottom of her heel.  Pain lateral side but more of numbness, tingling to the heel that goes to the lateral ankle. She has had some back pain. Had x-rays of lumbar spine from PCP.    Objective: AAO x3, NAD DP/PT pulses palpable bilaterally, CRT less than 3 seconds Sensation intact with Semmes Weinstein monofilament Negative Tinel sign. There is mild tenderness to palpation on the bottom of the heel and the insertion of the plantar fascia.  There is also discomfort on the course of the peroneal tendon.  She also describes the nerve symptoms at the lateral aspect of the foot/ankle.  No area pinpoint tenderness.  MMT 5/5. No pain with calf compression, swelling, warmth, erythema  Assessment: 66 year old female with tendinitis, plantar fasciitis and concern for nerve symptoms  Plan: -All treatment options discussed with the patient including all alternatives, risks, complications.  -Today dispensed a Tri-Lock ankle brace for immobilization.  We discussed shoe modifications good arch support and continue stretching, icing for the tendinitis, plantar fasciitis. -We will start gabapentin for the nerve symptoms. -Discussed neurology consult if symptoms continue. -Patient encouraged to call the office with any questions, concerns, change in symptoms.   Trula Slade DPM

## 2021-10-19 NOTE — Patient Instructions (Signed)
For instructions on how to put on your Tri-Lock Ankle Brace, please visit [email or web address]    Plantar Fasciitis (Heel Spur Syndrome) with Rehab The plantar fascia is a fibrous, ligament-like, soft-tissue structure that spans the bottom of the foot. Plantar fasciitis is a condition that causes pain in the foot due to inflammation of the tissue. SYMPTOMS  Pain and tenderness on the underneath side of the foot. Pain that worsens with standing or walking. CAUSES  Plantar fasciitis is caused by irritation and injury to the plantar fascia on the underneath side of the foot. Common mechanisms of injury include: Direct trauma to bottom of the foot. Damage to a small nerve that runs under the foot where the main fascia attaches to the heel bone. Stress placed on the plantar fascia due to bone spurs. RISK INCREASES WITH:  Activities that place stress on the plantar fascia (running, jumping, pivoting, or cutting). Poor strength and flexibility. Improperly fitted shoes. Tight calf muscles. Flat feet. Failure to warm-up properly before activity. Obesity. PREVENTION Warm up and stretch properly before activity. Allow for adequate recovery between workouts. Maintain physical fitness: Strength, flexibility, and endurance. Cardiovascular fitness. Maintain a health body weight. Avoid stress on the plantar fascia. Wear properly fitted shoes, including arch supports for individuals who have flat feet.  PROGNOSIS  If treated properly, then the symptoms of plantar fasciitis usually resolve without surgery. However, occasionally surgery is necessary.  RELATED COMPLICATIONS  Recurrent symptoms that may result in a chronic condition. Problems of the lower back that are caused by compensating for the injury, such as limping. Pain or weakness of the foot during push-off following surgery. Chronic inflammation, scarring, and partial or complete fascia tear, occurring more often from repeated  injections.  TREATMENT  Treatment initially involves the use of ice and medication to help reduce pain and inflammation. The use of strengthening and stretching exercises may help reduce pain with activity, especially stretches of the Achilles tendon. These exercises may be performed at home or with a therapist. Your caregiver may recommend that you use heel cups of arch supports to help reduce stress on the plantar fascia. Occasionally, corticosteroid injections are given to reduce inflammation. If symptoms persist for greater than 6 months despite non-surgical (conservative), then surgery may be recommended.   MEDICATION  If pain medication is necessary, then nonsteroidal anti-inflammatory medications, such as aspirin and ibuprofen, or other minor pain relievers, such as acetaminophen, are often recommended. Do not take pain medication within 7 days before surgery. Prescription pain relievers may be given if deemed necessary by your caregiver. Use only as directed and only as much as you need. Corticosteroid injections may be given by your caregiver. These injections should be reserved for the most serious cases, because they may only be given a certain number of times.  HEAT AND COLD Cold treatment (icing) relieves pain and reduces inflammation. Cold treatment should be applied for 10 to 15 minutes every 2 to 3 hours for inflammation and pain and immediately after any activity that aggravates your symptoms. Use ice packs or massage the area with a piece of ice (ice massage). Heat treatment may be used prior to performing the stretching and strengthening activities prescribed by your caregiver, physical therapist, or athletic trainer. Use a heat pack or soak the injury in warm water.  SEEK IMMEDIATE MEDICAL CARE IF: Treatment seems to offer no benefit, or the condition worsens. Any medications produce adverse side effects.  EXERCISES- RANGE OF MOTION (ROM) AND STRETCHING EXERCISES - Plantar    CARE IF:  Treatment seems to offer no benefit, or the condition worsens.  Any medications produce adverse side effects.   EXERCISES- RANGE OF MOTION (ROM) AND STRETCHING EXERCISES - Plantar Fasciitis (Heel Spur Syndrome) These exercises may help you when beginning to rehabilitate your injury. Your symptoms may resolve with or without further involvement from your physician, physical therapist or athletic trainer. While completing these exercises, remember:   Restoring tissue flexibility helps normal motion to return to the joints. This allows healthier, less painful movement and activity.  An effective stretch should be held for at least 30 seconds.  A stretch should never be painful. You should only feel a gentle lengthening or release in the stretched tissue.  RANGE OF MOTION - Toe Extension, Flexion  Sit with your right / left leg crossed over your opposite knee.  Grasp your toes and gently pull them back toward the top of your foot. You should feel a stretch on the bottom of your toes and/or foot.  Hold this stretch for 10 seconds.  Now, gently pull your toes toward the bottom of your foot. You should feel a stretch on the top of your toes and or foot.  Hold this stretch for 10 seconds. Repeat  times. Complete this stretch 3 times per day.   RANGE OF MOTION - Ankle Dorsiflexion, Active Assisted  Remove shoes and sit on a chair that is preferably not on a carpeted surface.  Place right / left foot under knee. Extend your opposite leg for support.  Keeping your heel down, slide your right / left foot back toward the chair until you feel a stretch at your ankle or calf. If you do not feel a stretch, slide your bottom forward to the edge of the chair, while still keeping your heel down.  Hold this stretch for 10 seconds. Repeat 3 times. Complete this stretch 2 times per day.   STRETCH  Gastroc, Standing  Place hands on wall.  Extend right / left leg, keeping the front knee somewhat bent.  Slightly point your toes inward on your back foot.  Keeping your right / left heel on the floor and your  knee straight, shift your weight toward the wall, not allowing your back to arch.  You should feel a gentle stretch in the right / left calf. Hold this position for 10 seconds. Repeat 3 times. Complete this stretch 2 times per day.  STRETCH  Soleus, Standing  Place hands on wall.  Extend right / left leg, keeping the other knee somewhat bent.  Slightly point your toes inward on your back foot.  Keep your right / left heel on the floor, bend your back knee, and slightly shift your weight over the back leg so that you feel a gentle stretch deep in your back calf.  Hold this position for 10 seconds. Repeat 3 times. Complete this stretch 2 times per day.  STRETCH  Gastrocsoleus, Standing  Note: This exercise can place a lot of stress on your foot and ankle. Please complete this exercise only if specifically instructed by your caregiver.   Place the ball of your right / left foot on a step, keeping your other foot firmly on the same step.  Hold on to the wall or a rail for balance.  Slowly lift your other foot, allowing your body weight to press your heel down over the edge of the step.  You should feel a stretch in your right / left calf.  Hold this   your physician, physical therapist or athletic trainer. While completing these exercises, remember:  Muscles can gain both the endurance and the strength needed for everyday activities through controlled exercises. Complete these exercises as instructed by your physician, physical therapist or athletic trainer. Progress the resistance and repetitions only as guided.  STRENGTH - Towel Curls Sit in a chair positioned on a non-carpeted surface. Place your foot on a towel, keeping your heel on the  floor. Pull the towel toward your heel by only curling your toes. Keep your heel on the floor. Repeat 3 times. Complete this exercise 2 times per day.  STRENGTH - Ankle Inversion Secure one end of a rubber exercise band/tubing to a fixed object (table, pole). Loop the other end around your foot just before your toes. Place your fists between your knees. This will focus your strengthening at your ankle. Slowly, pull your big toe up and in, making sure the band/tubing is positioned to resist the entire motion. Hold this position for 10 seconds. Have your muscles resist the band/tubing as it slowly pulls your foot back to the starting position. Repeat 3 times. Complete this exercises 2 times per day.  Document Released: 01/24/2005 Document Revised: 04/18/2011 Document Reviewed: 05/08/2008 [facility] Patient Information 2014 [place], [place]. Gabapentin Capsules or Tablets What is this medication? GABAPENTIN (GA ba pen tin) treats nerve pain. It may also be used to prevent and control seizures in people with epilepsy. It works by calming overactive nerves in your body. This medicine may be used for other purposes; ask your health care provider or pharmacist if you have questions. COMMON BRAND NAME(S): Active-PAC with Gabapentin, Orpha Bur, Gralise, Neurontin What should I tell my care team before I take this medication? They need to know if you have any of these conditions: Alcohol or substance use disorder Kidney disease Lung or breathing disease Suicidal thoughts, plans, or attempt; a previous suicide attempt by you or a family member An unusual or allergic reaction to gabapentin, other medications, foods, dyes, or preservatives Pregnant or trying to get pregnant Breast-feeding How should I use this medication? Take this medication by mouth with a glass of water. Follow the directions on the prescription label. You can take it with or without food. If it upsets your stomach, take it with food.  Take your medication at regular intervals. Do not take it more often than directed. Do not stop taking except on your care team's advice. If you are directed to break the 600 or 800 mg tablets in half as part of your dose, the extra half tablet should be used for the next dose. If you have not used the extra half tablet within 28 days, it should be thrown away. A special MedGuide will be given to you by the pharmacist with each prescription and refill. Be sure to read this information carefully each time. Talk to your care team about the use of this medication in children. While this medication may be prescribed for children as young as 3 years for selected conditions, precautions do apply. Overdosage: If you think you have taken too much of this medicine contact a poison control center or emergency room at once. NOTE: This medicine is only for you. Do not share this medicine with others. What if I miss a dose? If you miss a dose, take it as soon as you can. If it is almost time for your next dose, take only that dose. Do not take double or extra doses. What may interact with this  medication? Alcohol Antihistamines for allergy, cough, and cold Certain medications for anxiety or sleep Certain medications for depression like amitriptyline, fluoxetine, sertraline Certain medications for seizures like phenobarbital, primidone Certain medications for stomach problems General anesthetics like halothane, isoflurane, methoxyflurane, propofol Local anesthetics like lidocaine, pramoxine, tetracaine Medications that relax muscles for surgery Opioid medications for pain Phenothiazines like chlorpromazine, mesoridazine, prochlorperazine, thioridazine This list may not describe all possible interactions. Give your health care provider a list of all the medicines, herbs, non-prescription drugs, or dietary supplements you use. Also tell them if you smoke, drink alcohol, or use illegal drugs. Some items may  interact with your medicine. What should I watch for while using this medication? Visit your care team for regular checks on your progress. You may want to keep a record at home of how you feel your condition is responding to treatment. You may want to share this information with your care team at each visit. You should contact your care team if your seizures get worse or if you have any new types of seizures. Do not stop taking this medication or any of your seizure medications unless instructed by your care team. Stopping your medication suddenly can increase your seizures or their severity. This medication may cause serious skin reactions. They can happen weeks to months after starting the medication. Contact your care team right away if you notice fevers or flu-like symptoms with a rash. The rash may be red or purple and then turn into blisters or peeling of the skin. Or, you might notice a red rash with swelling of the face, lips or lymph nodes in your neck or under your arms. Wear a medical identification bracelet or chain if you are taking this medication for seizures. Carry a card that lists all your medications. This medication may affect your coordination, reaction time, or judgment. Do not drive or operate machinery until you know how this medication affects you. Sit up or stand slowly to reduce the risk of dizzy or fainting spells. Drinking alcohol with this medication can increase the risk of these side effects. Your mouth may get dry. Chewing sugarless gum or sucking hard candy, and drinking plenty of water may help. Watch for new or worsening thoughts of suicide or depression. This includes sudden changes in mood, behaviors, or thoughts. These changes can happen at any time but are more common in the beginning of treatment or after a change in dose. Call your care team right away if you experience these thoughts or worsening depression. If you become pregnant while using this medication, you  may enroll in the [place] Pregnancy Registry by calling [phone]. This registry collects information about the safety of antiepileptic medication use during pregnancy. What side effects may I notice from receiving this medication? Side effects that you should report to your care team as soon as possible: Allergic reactions or angioedema--skin rash, itching, hives, swelling of the face, eyes, lips, tongue, arms, or legs, trouble swallowing or breathing Rash, fever, and swollen lymph nodes Thoughts of suicide or self harm, worsening mood, feelings of depression Trouble breathing Unusual changes in mood or behavior in children after use such as difficulty concentrating, hostility, or restlessness Side effects that usually do not require medical attention (report to your care team if they continue or are bothersome): Dizziness Drowsiness Nausea Swelling of ankles, feet, or hands Vomiting This list may not describe all possible side effects. Call your doctor for medical advice about side effects. You may report  side effects to FDA at 1-800-FDA-1088. Where should I keep my medication? Keep out of reach of children and pets. Store at room temperature between 15 and 30 degrees C (59 and 86 degrees F). Get rid of any unused medication after the expiration date. This medication may cause accidental overdose and death if taken by other adults, children, or pets. To get rid of medications that are no longer needed or have expired: Take the medication to a medication take-back program. Check with your pharmacy or law enforcement to find a location. If you cannot return the medication, check the label or package insert to see if the medication should be thrown out in the garbage or flushed down the toilet. If you are not sure, ask your care team. If it is safe to put it in the trash, empty the medication out of the container. Mix the medication with cat litter, dirt, coffee  grounds, or other unwanted substance. Seal the mixture in a bag or container. Put it in the trash. NOTE: This sheet is a summary. It may not cover all possible information. If you have questions about this medicine, talk to your doctor, pharmacist, or health care provider.  2023 [employer]/Gold Standard (2020-01-28 00:00:00)

## 2021-11-25 ENCOUNTER — Ambulatory Visit (INDEPENDENT_AMBULATORY_CARE_PROVIDER_SITE_OTHER): Payer: Medicare Other | Admitting: Emergency Medicine

## 2021-11-25 ENCOUNTER — Encounter: Payer: Self-pay | Admitting: Emergency Medicine

## 2021-11-25 ENCOUNTER — Other Ambulatory Visit: Payer: Self-pay | Admitting: Emergency Medicine

## 2021-11-25 VITALS — BP 122/70 | HR 56 | Temp 98.1°F | Ht 62.75 in | Wt 166.4 lb

## 2021-11-25 DIAGNOSIS — G8929 Other chronic pain: Secondary | ICD-10-CM

## 2021-11-25 DIAGNOSIS — M5136 Other intervertebral disc degeneration, lumbar region: Secondary | ICD-10-CM | POA: Diagnosis not present

## 2021-11-25 DIAGNOSIS — Z981 Arthrodesis status: Secondary | ICD-10-CM

## 2021-11-25 DIAGNOSIS — M5030 Other cervical disc degeneration, unspecified cervical region: Secondary | ICD-10-CM

## 2021-11-25 DIAGNOSIS — Z23 Encounter for immunization: Secondary | ICD-10-CM | POA: Diagnosis not present

## 2021-11-25 DIAGNOSIS — M542 Cervicalgia: Secondary | ICD-10-CM | POA: Diagnosis not present

## 2021-11-25 DIAGNOSIS — M159 Polyosteoarthritis, unspecified: Secondary | ICD-10-CM

## 2021-11-25 DIAGNOSIS — Z1231 Encounter for screening mammogram for malignant neoplasm of breast: Secondary | ICD-10-CM

## 2021-11-25 DIAGNOSIS — M545 Low back pain, unspecified: Secondary | ICD-10-CM | POA: Diagnosis not present

## 2021-11-25 MED ORDER — TRAMADOL HCL 50 MG PO TABS: 50.0000 mg | ORAL_TABLET | Freq: Three times a day (TID) | ORAL | 0 refills | Status: AC | PRN

## 2021-11-25 NOTE — Progress Notes (Signed)
Christine Hawkins 66 y.o.   Chief Complaint  Patient presents with   Neck Pain   Back Pain    HISTORY OF PRESENT ILLNESS: This is a 66 y.o. female complaining of neck and back pain for several months. Has history of chronic neck pain status post surgery in the past. History of chronic lumbar pain. Imaging and x-rays have shown degenerative changes of cervical and lumbar spine Needs orthopedic referral. Has had about 4 episodes of severe paralyzing pain to neck area over the past couple of months. No injuries.  HPI   Prior to Admission medications   Medication Sig Start Date End Date Taking? Authorizing Provider  Acetaminophen (TYLENOL PO) Take 600 mg by mouth as needed (pain). Unknown strength   Yes [provider]  aspirin EC 81 MG tablet Take 81 mg by mouth daily.   Yes [provider]  azelastine (OPTIVAR) 0.05 % ophthalmic solution Apply 1 drop to eye daily. 01/27/20  Yes [provider]  [employer] COENZYME Q10 PO Take 100 mg by mouth daily.   Yes [provider]  gabapentin (NEURONTIN) 100 MG capsule Take 1 capsule (100 mg total) by mouth at bedtime. 10/19/21  Yes Trula Slade, DPM  loratadine (CLARITIN) 10 MG tablet Take 10 mg by mouth daily.   Yes [provider]  Multiple Vitamins-Minerals (CENTRUM SILVER 50+WOMEN) TABS Take 1 tablet by mouth daily. Unknown strength   Yes [provider]  mupirocin ointment (BACTROBAN) 2 % Sig to affected area twice a day x 7 days. 03/18/19  Yes Cerys Winget, Ines Bloomer, MD  rosuvastatin (CRESTOR) 40 MG tablet Take 1 tablet (40 mg total) by mouth daily. 08/24/21 08/19/22 Yes Janaysha Depaulo, Ines Bloomer, MD  triamcinolone (NASACORT) 55 MCG/ACT AERO nasal inhaler Place 2 sprays into the nose daily. 03/18/19  Yes Drue Camera, Ines Bloomer, MD  ezetimibe (ZETIA) 10 MG tablet Take 1 tablet (10 mg total) by mouth daily. 08/20/21 11/18/21  Park Liter, MD    Allergies  Allergen Reactions   Lactose  Intolerance (Gi)    Other     Dish/laundry dish detergent and bleach-smell bothers patient and dust   Bupropion Nausea Only   Niaspan [Niacin] Rash    Itching    Patient Active Problem List   Diagnosis Date Noted   Left foot pain 08/24/2021   Right flank pain 04/15/2021   High cholesterol 11/02/2020   Osteopenia 09/01/2020   Depression 09/01/2020   Colon polyps 09/01/2020   Anxiety 09/01/2020   Trigger ring finger of right hand 07/07/2020   Chronic neck pain 11/07/2017   Bilateral carotid artery stenosis 05/11/2017   Insomnia due to other mental disorder 04/19/2016   Prediabetes 04/02/2015   Hyperlipidemia LDL goal <130 04/02/2015   Gastroesophageal reflux disease without esophagitis 04/02/2015   Intrinsic eczema 04/02/2015   Recurrent major depressive disorder, in full remission ([place]) 04/02/2015   Elevated glucose 01/30/2015   Elevated ALT measurement 01/30/2015   Carpal tunnel syndrome of right wrist 01/30/2015   Bilateral leg edema 01/30/2015   Status post cervical spinal arthrodesis 05/22/2011   Anemia due to blood loss 04/28/2011   Primary osteoarthritis involving multiple joints 12/01/2006   Dyslipidemia 05/12/2006   COLONIC POLYPS, BENIGN, HX OF 05/12/2006    Past Medical History:  Diagnosis Date   Anxiety    Colon polyps    Depression    GERD (gastroesophageal reflux disease)    High cholesterol    Osteopenia     Past Surgical  History:  Procedure Laterality Date   BREAST LUMPECTOMY Left    CERVICAL SPINE SURGERY      2013 has 8 screws    CESAREAN SECTION     COLONOSCOPY  08/26/2009   Normal colonoscopy up to cecum    LEFT HEART CATHETERIZATION WITH CORONARY ANGIOGRAM N/A 06/07/2012   Procedure: LEFT HEART CATHETERIZATION WITH CORONARY ANGIOGRAM;  Surgeon: Laverda Page, MD;  Location: [facility] CATH LAB;  Service: Cardiovascular;  Laterality: N/A;    Social History   Socioeconomic History   Marital status: Legally Separated    Spouse name: Not on file    Number of children: 2   Years of education: Not on file   Highest education level: Not on file  Occupational History   Occupation: retired  Tobacco Use   Smoking status: Never   Smokeless tobacco: Never  Vaping Use   Vaping Use: Never used  Substance and Sexual Activity   Alcohol use: Not Currently   Drug use: Not Currently   Sexual activity: Not Currently  Other Topics Concern   Not on file  Social History Narrative   ** Merged History Encounter **       Social Determinants of Health   Financial Resource Strain: Not on file  Food Insecurity: Not on file  Transportation Needs: Not on file  Physical Activity: Not on file  Stress: Not on file  Social Connections: Not on file  Intimate Partner Violence: Not on file    Family History  Problem Relation Age of Onset   Heart disease Mother    Heart disease Father    Colon polyps Father    Kidney disease Maternal Grandmother    Stomach cancer Paternal Grandmother    Diabetes Paternal Grandmother    Esophageal cancer Neg Hx    Colon cancer Neg Hx    Rectal cancer Neg Hx      Review of Systems  Constitutional: Negative.   HENT: Negative.  Negative for congestion and sore throat.   Eyes: Negative.   Respiratory: Negative.  Negative for cough and shortness of breath.   Cardiovascular: Negative.  Negative for chest pain and palpitations.  Gastrointestinal:  Negative for abdominal pain, diarrhea, nausea and vomiting.  Genitourinary: Negative.  Negative for dysuria and hematuria.  Musculoskeletal:  Positive for back pain and neck pain.  Skin: Negative.  Negative for rash.  Neurological:  Positive for headaches. Negative for dizziness, sensory change, speech change and focal weakness.  All other systems reviewed and are negative.  Today's Vitals   11/25/21 1406  BP: 122/70  Pulse: (!) 56  Temp: 98.1 F (36.7 C)  TempSrc: Oral  SpO2: 98%  Weight: 166 lb 6 oz (75.5 kg)  Height: 5' 2.75" (1.594 m)   Body mass  index is 29.71 kg/m.   Physical Exam Vitals reviewed.  Constitutional:      Appearance: Normal appearance.  HENT:     Head: Normocephalic.  Eyes:     Extraocular Movements: Extraocular movements intact.     Conjunctiva/sclera: Conjunctivae normal.     Pupils: Pupils are equal, round, and reactive to light.  Cardiovascular:     Rate and Rhythm: Normal rate and regular rhythm.     Pulses: Normal pulses.     Heart sounds: Normal heart sounds.  Pulmonary:     Effort: Pulmonary effort is normal.     Breath sounds: Normal breath sounds.  Musculoskeletal:     Cervical back: Pain with movement present. Decreased range  of motion.     Lumbar back: Spasms and tenderness present. No bony tenderness. Decreased range of motion.  Skin:    General: Skin is warm and dry.  Neurological:     General: No focal deficit present.     Mental Status: She is alert and oriented to person, place, and time.     Sensory: No sensory deficit.     Motor: No weakness.  Psychiatric:        Mood and Affect: Mood normal.        Behavior: Behavior normal.      ASSESSMENT & PLAN: A total of 43 minutes was spent with the patient and counseling/coordination of care regarding preparing for this visit, review of most recent office visit notes, review of most recent imaging reports, review of chronic medical problems and their management, review of all medications, diagnosis of cervical and lumbar spine degenerative disc disease and need for follow-up with neurosurgery and orthopedics, pain management, prognosis, documentation and need for follow-up.  Problem List Items Addressed This Visit       Musculoskeletal and Integument   Primary osteoarthritis involving multiple joints    Active.  Tylenol helps.      Relevant Medications   traMADol (ULTRAM) 50 MG tablet   Degenerative disc disease, lumbar   Relevant Medications   traMADol (ULTRAM) 50 MG tablet   Other Relevant Orders   Ambulatory referral to  Orthopedic Surgery     Other   Chronic neck pain - Primary    Active and affecting quality of life. History of cervical spine surgery in the past Recent x-rays show postsurgical changes and significant degenerative changes Has been taking Tylenol for pain which helps a little bit May take tramadol as needed for severe pain. Needs neurosurgical evaluation. Referral placed today.      Relevant Medications   traMADol (ULTRAM) 50 MG tablet   Other Relevant Orders   Ambulatory referral to Neurosurgery   Chronic bilateral low back pain without sciatica    Secondary to degenerative disc disease. Needs orthopedic evaluation. Referral placed today.      Relevant Medications   traMADol (ULTRAM) 50 MG tablet   Status post cervical spinal arthrodesis   Relevant Orders   Ambulatory referral to Neurosurgery   Other Visit Diagnoses     Need for vaccination       Relevant Orders   Flu Vaccine QUAD High Dose(Fluad) (Completed)   Degenerative disc disease, cervical       Relevant Medications   traMADol (ULTRAM) 50 MG tablet   Other Relevant Orders   Ambulatory referral to Neurosurgery      Patient Instructions  Discopata degenerativa Degenerative Disk Disease  La discopata degenerativa es una afeccin causada por los cambios que ocurren en los discos espinales a medida que la persona envejece. Los discos vertebrales son blandos y comprimibles, y se [employer] de la columna (vrtebras). Estos discos actan como amortiguadores de los [place]. La discopata degenerativa puede afectar a toda la columna vertebral. Sin embargo, el cuello y parte inferior de la espalda son las zonas afectadas con ms frecuencia. Al envejecer, pueden producirse muchos [employer]IG[employer], por ejemplo: Pueden secarse y encogerse. Pueden formarse pequeos desgarros en el recubrimiento exterior duro del disco (anillo). El [employer] puede reducirse debido a la prdida de  [place]. Pueden desarrollarse crecimientos anormales en el hueso (espolones). Esto puede ejercer presin [employer] races nerviosas que salen del canal espinal  y Engineer, production. El canal espinal puede estrecharse. Cules son las causas? Esta afeccin puede ser causada por lo siguiente: Degeneracin normal con la edad. Lesiones. Ciertas actividades y deportes que [place]. Qu incrementa el riesgo? Los siguientes factores pueden hacer que sea ms propenso a Armed forces training and education officer afeccin: Tener sobrepeso. Tener antecedentes familiares de discopata degenerativa. Fumar y consumir productos que contienen nicotina y tabaco. Una lesin repentina. Tener un trabajo que exija levantar objetos pesados. Cules son los signos o sntomas? Los sntomas de esta afeccin incluyen: Dolor de intensidad variable. Algunas personas no tienen dolor, mientras que otras sufren un dolor intenso. La ubicacin del dolor depende de la zona de la columna vertebral que est afectada. Es posible que tenga: Dolor de cuello o del brazo si se trata de un disco que se encuentra en la zona del cuello. Dolor en la espalda, las nalgas o las piernas si un disco de la parte inferior de la espalda se ve afectado. Dolor que empeora al agacharse o al [employer], o al realizar movimientos de torsin. Dolor que puede comenzar de [place] gradual y Copy con el paso del [place]. Tambin puede aparecer despus de sufrir una lesin leve o importante. Hormigueo o adormecimiento de los brazos o las piernas. Cmo se diagnostica? Esta afeccin se puede diagnosticar en funcin de lo siguiente: Los sntomas y los antecedentes mdicos. Un examen fsico. Estudios de diagnstico por imgenes, como: Radiografa de la columna. Exploracin por tomografa computarizada (TC). Resonancia magntica (RM). Cmo se trata? El tratamiento de esta afeccin puede incluir: Medicamentos. Inyeccin de corticoesteroides en la espalda. Ejercicios  de rehabilitacin. Estas actividades buscan fortalecer los msculos de la espalda y el abdomen para sostener mejor la columna vertebral. Es posible que deba someterse a una ciruga si los tratamientos no ayudan a Public house manager los sntomas o si tiene Social research officer, government intenso. Siga estas instrucciones en su casa: Medicamentos Use los medicamentos de venta libre y los recetados solamente como se lo haya indicado el mdico. Pregntele al mdico si el medicamento recetado: Hace necesario que evite conducir o usar [place]. Puede causarle estreimiento. Es posible que tenga que tomar estas medidas para prevenir o tratar el estreimiento: Electronics engineer suficiente lquido como para Theatre manager la orina de color amarillo plido. Usar medicamentos recetados o de Radio broadcast assistant. Consumir alimentos ricos en fibra, como frijoles, cereales integrales, y frutas y verduras frescas. Limitar el consumo de alimentos ricos en grasa y azcares procesados, como los alimentos fritos o dulces. Actividad Haga reposo como se lo haya indicado el mdico. Evite estar sentado durante largos perodos sin moverse. Levntese y camine un poco cada 1 a 2 horas. Esto es importante para mejorar el flujo sanguneo y la respiracin. Pida ayuda si se siente dbil o inestable. Retome sus actividades normales segn lo indicado por el mdico. Pregntele al mdico qu actividades son seguras para usted. Practique los ejercicios de Civil Service fast streamer se lo haya indicado el mdico. Mantenga una [place]. No levante ningn objeto que pese ms de 10 libras (4.5 kg) o que supere el lmite de peso que le hayan indicado, Nurse, children's que el mdico le diga que puede [place]. Siga las tcnicas adecuadas para caminar y Lexicographer objetos pesados como se lo haya indicado el mdico. Control del dolor, la rigidez y la hinchazn     Si se lo indican, aplique hielo sobre la zona dolorida. El hielo puede ayudar a Best boy. Para hacer esto: Ponga el hielo en una bolsa  plstica. Coloque una [employer]  la piel y Therapist, nutritional. Aplique el hielo durante 20 minutos, 2 o 3 veces por da. Retire el hielo si la piel se pone de color rojo brillante. Esto es [employer]. Si no puede sentir dolor, calor o fro, tiene un mayor riesgo de que se dae la zona. Si se lo indican, aplique calor en la zona dolorida con la frecuencia que le haya indicado el mdico. El calor puede reducir el hormigueo de los msculos. Use la fuente de calor que el mdico le recomiende, como una compresa de calor hmedo o una almohadilla trmica. Coloque una toalla entre la piel y la fuente de Freight forwarder. Aplique calor durante 20 a 30 minutos. Retire la fuente de calor si la piel se pone de color rojo brillante. Esto es especialmente importante si no puede sentir dolor, calor o fro. Puede correr un riesgo mayor de sufrir quemaduras. Indicaciones generales [place] los hbitos para sentarse, estar de pie y dormir como se lo haya indicado el mdico. Evite sentarse en la misma posicin durante perodos prolongados de [place]. Cambie frecuentemente de posicin. Mantenga un peso saludable o baje de peso segn se lo haya indicado el mdico. No consuma ningn producto que contenga nicotina o tabaco, como cigarrillos, cigarrillos electrnicos y tabaco de Higher education careers adviser. Si necesita ayuda para dejar de consumir estos productos, consulte al [employer]. Use calzado que tenga el soporte correcto. Cumpla con todas las visitas de seguimiento. Esto es importante. Esto puede incluir visitas para fisioterapia. Comunquese con un mdico si: Su dolor no desaparece en el trmino de 1 a 4 semanas. Pierda el apetito. Pierde peso sin proponrselo. Busque ayuda de inmediato si: Tiene dolor intenso. Nota debilidad [employer], las manos o las piernas. Comienza a perder el control de la vejiga o los intestinos. Tiene fiebre o sudores nocturnos. Resumen La discopata degenerativa es una afeccin causada por los cambios que ocurren [employer] discos espinales a medida que la persona envejece. Esta afeccin puede afectar a toda la columna vertebral. Sin embargo, el cuello y parte inferior de la espalda son las zonas afectadas con ms frecuencia. Use los medicamentos de venta libre y los recetados solamente como se lo haya indicado el mdico. Esta informacin no tiene Marine scientist el consejo del mdico. Asegrese de hacerle al mdico cualquier pregunta que tenga. Document Revised: 07/18/2019 Document Reviewed: 07/18/2019 Elsevier Patient Education  [place], MD [facility]

## 2021-11-25 NOTE — Patient Instructions (Signed)
Discopata degenerativa Degenerative Disk Disease  La discopata degenerativa es una afeccin causada por los cambios que ocurren en los discos espinales a medida que la persona envejece. Los discos vertebrales son blandos y comprimibles, y se [employer] de la columna (vrtebras). Estos discos actan como amortiguadores de los [place]. La discopata degenerativa puede afectar a toda la columna vertebral. Sin embargo, el cuello y parte inferior de la espalda son las zonas afectadas con ms frecuencia. Al envejecer, pueden producirse muchos CIGNA, por ejemplo: Pueden secarse y encogerse. Pueden formarse pequeos desgarros en el recubrimiento exterior duro del disco (anillo). El [employer] puede reducirse debido a la prdida de agua. Pueden desarrollarse crecimientos anormales en el hueso (espolones). Esto puede ejercer presin [employer] races nerviosas que salen del canal espinal y Engineer, production. El canal espinal puede estrecharse. Cules son las causas? Esta afeccin puede ser causada por lo siguiente: Degeneracin normal con la edad. Lesiones. Ciertas actividades y deportes que [place]. Qu incrementa el riesgo? Los siguientes factores pueden hacer que sea ms propenso a Armed forces training and education officer afeccin: Tener sobrepeso. Tener antecedentes familiares de discopata degenerativa. Fumar y consumir productos que contienen nicotina y tabaco. Una lesin repentina. Tener un trabajo que exija levantar objetos pesados. Cules son los signos o sntomas? Los sntomas de esta afeccin incluyen: Dolor de intensidad variable. Algunas personas no tienen dolor, mientras que otras sufren un dolor intenso. La ubicacin del dolor depende de la zona de la columna vertebral que est afectada. Es posible que tenga: Dolor de cuello o del brazo si se trata de un disco que se encuentra en la zona del cuello. Dolor en la espalda, las nalgas o las piernas si un  disco de la parte inferior de la espalda se ve afectado. Dolor que empeora al agacharse o al [employer], o al realizar movimientos de torsin. Dolor que puede comenzar de [place] gradual y Copy con el paso del [place]. Tambin puede aparecer despus de sufrir una lesin leve o importante. Hormigueo o adormecimiento de los brazos o las piernas. Cmo se diagnostica? Esta afeccin se puede diagnosticar en funcin de lo siguiente: Los sntomas y los antecedentes mdicos. Un examen fsico. Estudios de diagnstico por imgenes, como: Radiografa de la columna. Exploracin por tomografa computarizada (TC). Resonancia magntica (RM). Cmo se trata? El tratamiento de esta afeccin puede incluir: Medicamentos. Inyeccin de corticoesteroides en la espalda. Ejercicios de rehabilitacin. Estas actividades buscan fortalecer los msculos de la espalda y el abdomen para sostener mejor la columna vertebral. Es posible que deba someterse a una ciruga si los tratamientos no ayudan a Public house manager los sntomas o si tiene Social research officer, government intenso. Siga estas instrucciones en su casa: Medicamentos Use los medicamentos de venta libre y los recetados solamente como se lo haya indicado el mdico. Pregntele al mdico si el medicamento recetado: Hace necesario que evite conducir o usar [place]. Puede causarle estreimiento. Es posible que tenga que tomar estas medidas para prevenir o tratar el estreimiento: Electronics engineer suficiente lquido como para Theatre manager la orina de color amarillo plido. Usar medicamentos recetados o de Radio broadcast assistant. Consumir alimentos ricos en fibra, como frijoles, cereales integrales, y frutas y verduras frescas. Limitar el consumo de alimentos ricos en grasa y azcares procesados, como los alimentos fritos o dulces. Actividad [place]aga reposo como se lo haya indicado el mdico. Evite estar sentado durante largos perodos sin moverse. Levntese y camine un poco cada 1 a 2 horas. Esto es importante  para mejorar  el flujo sanguneo y la respiracin. Pida ayuda si se siente dbil o inestable. Retome sus actividades normales segn lo indicado por el mdico. Pregntele al mdico qu actividades son seguras para usted. Practique los ejercicios de Civil Service fast streamer se lo haya indicado el mdico. Mantenga una [place]. No levante ningn objeto que pese ms de 10 libras (4.5 kg) o que supere el lmite de peso que le hayan indicado, Nurse, children's que el mdico le diga que puede [place]. Siga las tcnicas adecuadas para caminar y Lexicographer objetos pesados como se lo haya indicado el mdico. Control del dolor, la rigidez y la hinchazn     Si se lo indican, aplique hielo sobre la zona dolorida. El hielo puede ayudar a Best boy. Para hacer esto: Ponga el hielo en una bolsa plstica. Coloque una toalla entre la piel y Therapist, nutritional. Aplique el hielo durante 20 minutos, 2 o 3 veces por da. Retire el hielo si la piel se pone de color rojo brillante. Esto es [employer]. Si no puede sentir dolor, calor o fro, tiene un mayor riesgo de que se dae la zona. Si se lo indican, aplique calor en la zona dolorida con la frecuencia que le haya indicado el mdico. El calor puede reducir el hormigueo de los msculos. Use la fuente de calor que el mdico le recomiende, como una compresa de calor hmedo o una almohadilla trmica. Coloque una toalla entre la piel y la fuente de Freight forwarder. Aplique calor durante 20 a 30 minutos. Retire la fuente de calor si la piel se pone de color rojo brillante. Esto es especialmente importante si no puede sentir dolor, calor o fro. Puede correr un riesgo mayor de sufrir quemaduras. Indicaciones generales [place] los hbitos para sentarse, estar de pie y dormir como se lo haya indicado el mdico. Evite sentarse en la misma posicin durante perodos prolongados de [place]. Cambie frecuentemente de posicin. Mantenga un peso saludable o baje de peso segn se lo haya indicado el mdico. No  consuma ningn producto que contenga nicotina o tabaco, como cigarrillos, cigarrillos electrnicos y tabaco de Higher education careers adviser. Si necesita ayuda para dejar de consumir estos productos, consulte al [employer]. Use calzado que tenga el soporte correcto. Cumpla con todas las visitas de seguimiento. Esto es importante. Esto puede incluir visitas para fisioterapia. Comunquese con un mdico si: Su dolor no desaparece en el trmino de 1 a 4 semanas. Pierda el apetito. Pierde peso sin proponrselo. Busque ayuda de inmediato si: Tiene dolor intenso. Nota debilidad [employer], las manos o las piernas. Comienza a perder el control de la vejiga o los intestinos. Tiene fiebre o sudores nocturnos. Resumen La discopata degenerativa es una afeccin causada por los cambios que ocurren [employer] discos espinales a medida que la persona envejece. Esta afeccin puede afectar a toda la columna vertebral. Sin embargo, el cuello y parte inferior de la espalda son las zonas afectadas con ms frecuencia. Use los medicamentos de venta libre y los recetados solamente como se lo haya indicado el mdico. Esta informacin no tiene Marine scientist el consejo del mdico. Asegrese de hacerle al mdico cualquier pregunta que tenga. Document Revised: 07/18/2019 Document Reviewed: 07/18/2019 Elsevier Patient Education  [place].

## 2021-11-25 NOTE — Assessment & Plan Note (Signed)
Active.  Tylenol helps.

## 2021-11-25 NOTE — Assessment & Plan Note (Signed)
Active and affecting quality of life. History of cervical spine surgery in the past Recent x-rays show postsurgical changes and significant degenerative changes Has been taking Tylenol for pain which helps a little bit May take tramadol as needed for severe pain. Needs neurosurgical evaluation. Referral placed today.

## 2021-11-25 NOTE — Assessment & Plan Note (Signed)
Secondary to degenerative disc disease. Needs orthopedic evaluation. Referral placed today.

## 2021-12-01 ENCOUNTER — Ambulatory Visit (INDEPENDENT_AMBULATORY_CARE_PROVIDER_SITE_OTHER): Payer: Medicare Other | Admitting: Orthopedic Surgery

## 2021-12-01 ENCOUNTER — Ambulatory Visit (INDEPENDENT_AMBULATORY_CARE_PROVIDER_SITE_OTHER): Payer: Medicare Other

## 2021-12-01 VITALS — BP 132/68 | HR 73 | Ht 62.75 in | Wt 166.0 lb

## 2021-12-01 DIAGNOSIS — M545 Low back pain, unspecified: Secondary | ICD-10-CM

## 2021-12-01 DIAGNOSIS — G8929 Other chronic pain: Secondary | ICD-10-CM | POA: Diagnosis not present

## 2021-12-01 DIAGNOSIS — M5416 Radiculopathy, lumbar region: Secondary | ICD-10-CM | POA: Diagnosis not present

## 2021-12-01 NOTE — Progress Notes (Signed)
Orthopedic Spine Surgery Office Note  Assessment: Patient is a 66 y.o. female with midlumbar back pain that radiates down her left lower extremity along the posterior aspect   Plan: -Explained that initially conservative treatment is tried as a significant number of patients may experience relief with these treatment modalities. Discussed that the conservative treatments include:  -activity modification  -physical therapy  -over the counter pain medications  -medrol dosepak  -lumbar steroid injections -Patient has tried activity modification, tylenol, NSAIDs , gabapentin -Recommended physical therapy, regular home exercise -Symptoms have bene present for over 6 weeks so recommended MRI of lumbar spine to evaluate the radicular leg pain -Patient should return to office in 4 weeks, repeat x-rays of lumbar spine at next visit: none   Patient expressed understanding of the plan and all questions were answered to the patient's satisfaction.   ___________________________________________________________________________   History:  Patient is a 66 y.o. female who presents today for lumbar spine.  Patient reports symptoms of significant lower back pain that radiates into her left leg.  The pain radiates down the posterior aspect of her leg all the way down to the foot.  She has had the symptoms for about 2 months.  They have gotten progressively worse with time.  She notices it is worse with activity and improves with rest.  However, she still feels it at rest.  She feels that both of her legs are weaker.  She has not had any falls.  Her left leg feels weaker than her right.  She has a 2-year history of urinary incontinence.  She states that she has no control and so when she leaves the house she just does not drink fluids. There was no trauma or injury that brought on the pain. No right leg symptoms.   Has been seeing podiatry for unrelated MTP left sided pain and left lateral ankle  pain.   Weakness: Yes, reports both legs feel weak (left greater than right) Symptoms of imbalance: sometimes because her legs feel weak Paresthesias and numbness: yes, down the back of her left leg Bowel or bladder incontinence: yes, 2 year history of urinary incontinence. No bowel incontinence Saddle anesthesia: denies  Treatments tried: NSAIDs, tylenol activity modification, gabapentin  Review of systems: Denies fevers and chills, night sweats, unexplained weight loss, history of cancer. Pain does wake her at night at times.   Past medical history: HLD Anxiety Neuropathy Chronic pain  Allergies: lactose, buproprion, niacin  Past surgical history:  Multilevel ACDF  Social history: Denies use of nicotine product (smoking, vaping, patches, smokeless) Alcohol use: denies Denies recreational drug use   Physical Exam:  General: no acute distress, appears stated age Neurologic: alert, answering questions appropriately, following commands Respiratory: unlabored breathing on room air, symmetric chest rise Psychiatric: appropriate affect, normal cadence to speech   MSK (spine):  -Strength exam      Left  Right EHL    5/5  5/5 TA    5/5  5/5 GSC    5/5  5/5 Knee extension  5/5  5/5 Hip flexion   4/5  5/5  -Sensory exam    Sensation intact to light touch in L3-S1 nerve distributions of bilateral lower extremities  -Achilles DTR: 1/4 on the left, 1/4 on the right -Patellar tendon DTR: 1/4 on the left, 1/4 on the right  -Straight leg raise: negative  -Contralateral straight leg raise: negative -Clonus: no beats bilaterally  -Left hip exam: had pain in her back with internal rotation, no  other pain through range of motion, negative FABER, negative stinchfield -Right hip exam: no pain through range of motion, negative FABER, negative stinchfield  Negative hoffman bilaterally, negative grip and release test, no interosseous atrophy, no instability with tandem gait,  no beats of clonus bilaterally, no hyperreflexia with brachioradialis or biceps reflex testing bilaterally  Imaging: XR of the lumbar spine from 12/01/2021 was independently reviewed and interpreted, showing disc height loss at L5/S1 and L2/3. Anterior osteophyte formation at L2/3. Grade 1 spondylolisthesis at L4/5. No fracture or dislocation.    Patient name: Christine Hawkins Patient MRN: [id number] Date of visit: 12/01/21

## 2021-12-10 ENCOUNTER — Ambulatory Visit
Admission: RE | Admit: 2021-12-10 | Discharge: 2021-12-10 | Disposition: A | Discharge status: Home / Self Care | Payer: Medicare Other | Source: Ambulatory Visit | Attending: Orthopedic Surgery | Admitting: Orthopedic Surgery

## 2021-12-10 DIAGNOSIS — M5416 Radiculopathy, lumbar region: Secondary | ICD-10-CM

## 2021-12-21 ENCOUNTER — Ambulatory Visit: Payer: Medicare Other | Admitting: Podiatry

## 2021-12-28 ENCOUNTER — Ambulatory Visit (HOSPITAL_COMMUNITY): Payer: Medicare Other | Attending: Physical Therapy | Admitting: Physical Therapy

## 2021-12-28 NOTE — Therapy (Incomplete)
OUTPATIENT PHYSICAL THERAPY THORACOLUMBAR EVALUATION   Patient Name: Chamari Cutbirth MRN: [id number] DOB:December 10, 1955, 66 y.o., female Today's Date: 12/28/2021  END OF SESSION:   Past Medical History:  Diagnosis Date   Anxiety    Colon polyps    Depression    GERD (gastroesophageal reflux disease)    High cholesterol    Osteopenia    Past Surgical History:  Procedure Laterality Date   BREAST LUMPECTOMY Left    CERVICAL SPINE SURGERY      2013 has 8 screws    CESAREAN SECTION     COLONOSCOPY  08/26/2009   Normal colonoscopy up to cecum    LEFT HEART CATHETERIZATION WITH CORONARY ANGIOGRAM N/A 06/07/2012   Procedure: LEFT HEART CATHETERIZATION WITH CORONARY ANGIOGRAM;  Surgeon: Laverda Page, MD;  Location: [facility] CATH LAB;  Service: Cardiovascular;  Laterality: N/A;   Patient Active Problem List   Diagnosis Date Noted   Degenerative disc disease, lumbar 11/25/2021   Left foot pain 08/24/2021   Right flank pain 04/15/2021   High cholesterol 11/02/2020   Osteopenia 09/01/2020   Depression 09/01/2020   Colon polyps 09/01/2020   Anxiety 09/01/2020   Trigger ring finger of right hand 07/07/2020   Chronic neck pain 11/07/2017   Bilateral carotid artery stenosis 05/11/2017   Insomnia due to other mental disorder 04/19/2016   Chronic bilateral low back pain without sciatica 09/14/2015   Prediabetes 04/02/2015   Hyperlipidemia LDL goal <130 04/02/2015   Gastroesophageal reflux disease without esophagitis 04/02/2015   Intrinsic eczema 04/02/2015   Recurrent major depressive disorder, in full remission ([place]) 04/02/2015   Elevated glucose 01/30/2015   Elevated ALT measurement 01/30/2015   Carpal tunnel syndrome of right wrist 01/30/2015   Bilateral leg edema 01/30/2015   Status post cervical spinal arthrodesis 05/22/2011   Primary osteoarthritis involving multiple joints 12/01/2006   Dyslipidemia 05/12/2006   COLONIC POLYPS, BENIGN, HX OF 05/12/2006    PCP: Horald Pollen MD  REFERRING PROVIDER: Callie Fielding, MD  REFERRING DIAG: M54.16 (ICD-10-CM) - Radiculopathy, lumbar region  Rationale for Evaluation and Treatment: Rehabilitation  THERAPY DIAG:  No diagnosis found.  ONSET DATE: ***  SUBJECTIVE:                                                                                                                                                                                           SUBJECTIVE STATEMENT: ***  PERTINENT HISTORY:  HLD, Anxiety, Neuropathy, Chronic pain, Multilevel ACDF  PAIN:  Are you having pain? {OPRCPAIN:27236}  PRECAUTIONS: None  WEIGHT BEARING RESTRICTIONS: No  FALLS:  Has patient fallen in last 6 months? {  fallsyesno:27318}  LIVING ENVIRONMENT: Lives with: {OPRC lives with:25569::"lives with their family"} Lives in: {Lives in:25570} Stairs: {opstairs:27293} Has following equipment at home: {Assistive devices:23999}  OCCUPATION: ***  PLOF: {PLOF:24004}  PATIENT GOALS: ***  NEXT MD VISIT:   OBJECTIVE:   DIAGNOSTIC FINDINGS:  MRI 12/10/21 IMPRESSION: 1. Multilevel lumbar disc and facet degeneration, most notable at L4-5 where there is severe spinal stenosis. 2. Moderate lateral recess stenosis and mild-to-moderate neural foraminal stenosis at L5-S1. 3. Mild-to-moderate spinal stenosis at L3-4.  PATIENT SURVEYS:  {rehab surveys:24030}  SCREENING FOR RED FLAGS: Bowel or bladder incontinence: {Yes/No:304960894} Spinal tumors: {Yes/No:304960894} Cauda equina syndrome: {Yes/No:304960894} Compression fracture: {Yes/No:304960894} Abdominal aneurysm: {Yes/No:304960894}  COGNITION: Overall cognitive status: {cognition:24006}     SENSATION: {sensation:27233}  MUSCLE LENGTH: Hamstrings: Right *** deg; Left *** deg Thomas test: Right *** deg; Left *** deg  POSTURE: {posture:25561}  PALPATION: ***  LUMBAR ROM:   AROM eval  Flexion   Extension   Right lateral flexion   Left lateral  flexion   Right rotation   Left rotation    (Blank rows = not tested)  LOWER EXTREMITY ROM:     Active  Right eval Left eval  Hip flexion    Hip extension    Hip abduction    Hip adduction    Hip internal rotation    Hip external rotation    Knee flexion    Knee extension    Ankle dorsiflexion    Ankle plantarflexion    Ankle inversion    Ankle eversion     (Blank rows = not tested)  LOWER EXTREMITY MMT:    MMT Right eval Left eval  Hip flexion    Hip extension    Hip abduction    Hip adduction    Hip internal rotation    Hip external rotation    Knee flexion    Knee extension    Ankle dorsiflexion    Ankle plantarflexion    Ankle inversion    Ankle eversion     (Blank rows = not tested)  LUMBAR SPECIAL TESTS:  {lumbar special test:25242}  FUNCTIONAL TESTS:  {Functional tests:24029}  GAIT: Distance walked: *** Assistive device utilized: {Assistive devices:23999} Level of assistance: {Levels of assistance:24026} Comments: ***  TODAY'S TREATMENT:                                                                                                                              DATE:  12/28/21 ***    PATIENT EDUCATION:  Education details: Patient educated on exam findings, POC, scope of PT, HEP, and ***. Person educated: Patient Education method: Explanation, Demonstration, and Handouts Education comprehension: verbalized understanding, returned demonstration, verbal cues required, and tactile cues required  HOME EXERCISE PROGRAM: ***  ASSESSMENT:  CLINICAL IMPRESSION: Patient a 66 y.o. y.o. female who was seen today for physical therapy evaluation and treatment for lumbar radiculopathy. Patient presents with pain limited deficits in lumbar spine and BLE strength,  ROM, endurance, activity tolerance, and functional mobility with ADL. Patient is having to modify and restrict ADL as indicated by outcome measure score as well as subjective information and  objective measures which is affecting overall participation. Patient will benefit from skilled physical therapy in order to improve function and reduce impairment.   OBJECTIVE IMPAIRMENTS: decreased activity tolerance, decreased balance, decreased endurance, decreased mobility, difficulty walking, decreased ROM, decreased strength, increased muscle spasms, impaired flexibility, improper body mechanics, postural dysfunction, and pain.   ACTIVITY LIMITATIONS: carrying, lifting, bending, standing, squatting, stairs, transfers, locomotion level, and caring for others  PARTICIPATION LIMITATIONS: meal prep, cleaning, laundry, shopping, community activity, and yard work  PERSONAL FACTORS: Fitness, Time since onset of injury/illness/exacerbation, and 3+ comorbidities: HLD, Anxiety, Neuropathy, Chronic pain, Multilevel ACDF  are also affecting patient's functional outcome.   REHAB POTENTIAL: {rehabpotential:25112}  CLINICAL DECISION MAKING: {clinical decision making:25114}  EVALUATION COMPLEXITY: {Evaluation complexity:25115}   GOALS: Goals reviewed with patient? {yes/no:20286}  SHORT TERM GOALS: Target date: ***  Patient will be independent with HEP in order to improve functional outcomes. Baseline:  Goal status: INITIAL  2.  Patient will report at least 25% improvement in symptoms for improved quality of life. Baseline:  Goal status: INITIAL  3.  *** Baseline: *** Goal status: {GOALSTATUS:25110}  4.  *** Baseline: *** Goal status: {GOALSTATUS:25110}  5.  *** Baseline: *** Goal status: {GOALSTATUS:25110}  6.  *** Baseline: *** Goal status: {GOALSTATUS:25110}  LONG TERM GOALS: Target date: ***  Patient will report at least 75% improvement in symptoms for improved quality of life. Baseline:  Goal status: INITIAL  2.  Patient will improve FOTO score by at least *** points in order to indicate improved tolerance to activity. Baseline: *** Goal status: INITIAL  3.  Patient  will demonstrate at least 25% improvement in lumbar ROM in all restricted planes for improved ability to move trunk while completing chores. Baseline: see above Goal status: INITIAL  4.  Patient will be able to ambulate at least *** feet in 2MWT in order to demonstrate improved tolerance to activity. Baseline: *** Goal status: INITIAL  5.  Patient will demonstrate grade of 5/5 MMT grade in all tested musculature as evidence of improved strength to assist with stair ambulation and gait.   Baseline: see above Goal status: INITIAL  6.  *** Baseline: *** Goal status: {GOALSTATUS:25110}   PLAN:  PT FREQUENCY: {rehab frequency:25116}  PT DURATION: {rehab duration:25117}  PLANNED INTERVENTIONS: Therapeutic exercises, Therapeutic activity, Neuromuscular re-education, Balance training, Gait training, Patient/Family education, Joint manipulation, Joint mobilization, Stair training, Orthotic/Fit training, DME instructions, Aquatic Therapy, Dry Needling, Electrical stimulation, Spinal manipulation, Spinal mobilization, Cryotherapy, Moist heat, Compression bandaging, scar mobilization, Splintting, Taping, Traction, Ultrasound, Ionotophoresis '4mg'$ /ml Dexamethasone, and Manual therapy   PLAN FOR NEXT SESSION: ***   Vianne Bulls Donise Woodle, PT 12/28/2021, 7:51 AM

## 2021-12-29 ENCOUNTER — Ambulatory Visit: Payer: Medicare Other | Admitting: Orthopedic Surgery

## 2022-01-11 ENCOUNTER — Ambulatory Visit (INDEPENDENT_AMBULATORY_CARE_PROVIDER_SITE_OTHER): Payer: Medicare Other | Admitting: Orthopedic Surgery

## 2022-01-11 DIAGNOSIS — M4712 Other spondylosis with myelopathy, cervical region: Secondary | ICD-10-CM | POA: Diagnosis not present

## 2022-01-11 NOTE — Progress Notes (Addendum)
Orthopedic Spine Surgery Office Note  Assessment: Patient is a 66 y.o. female with symptoms consistent with cervical myelopathy. Her mJOa score is 13. MRI shows central stenosis at C3/4 above prior fusion.    Plan: -Explained that surgery is recommended for patients with myelopath to prevent stepwise decline in function which is the natural history of the disease -In her case, I discussed a C3/4 ACDF as an option -She had XR from 11/2017 that I thought were 11/2021, so she needs XR before surgery can be scheduled (cervical AP, lateral, flex/ex) to confirm my same plan if she decides to pursue operative treatment. She can get those closer to her home as long as I can view them.  -Patient will call the office with further questions or a decision about surgery   The patient has signs and symptoms consistent with cervical myelopathy. Her mJOA score is 13. She did not have these symptoms until about a year after her ACDF. Informed her of the natural history of stepwise decline with myelopathy. Given this course, operative management in the form of C3/4 anterior cervical discectomy and fusion was recommended to the patient. The risks, including but not limited to pseudarthrosis, dysphagia, hematoma, airway compromise, recurrent laryngeal nerve injury, esophageal perforation, durotomy, spinal cord injury, nerve root injury, persistent pain, adjacent segment disease, infection, bleeding, hardware failure, vascular injury, heart attack, death, stroke, fracture, and need for additional procedures were discussed with the patient. The benefit of surgery would be preventing progression of the myelopathy and not to reverse any myelopathic symptoms. Explained that the patient may get some relief of radicular type pain, but they may not get full relief of their pain with this surgery, especially any neck pain. The alternatives to surgical management would be continued monitoring, physical therapy, over-the-counter pain  medications, ambulatory aids, and activity modification. I reiterated that these modalities would not change the natural history of the disease and that is why operative management has been recommended. All the patient's questions were answered to their satisfaction. After this discussion, patient and her son wanted to think about it further. They said they were going to call the office when they reach a decision. I told them I am happy to talk with them further either in the office or over the phone if they have further questions.    __________________________________________________________________________  History: Patient is a 66 y.o. female who was previously seen for low back pain that radiates into bilateral lower extremities. When she was last seen, it just radiated down the left leg on the posterior aspect. Now, it is radiating down the right leg as well. She gets numbness and tingling along the posterior aspect of the legs. It is not constant like the pain but does happen periodically. Pain has gotten worse since the last time I saw her.   On further discussion, she relayed that she has had issues with balance and hand dexterity. She has had difficulty with fine motor skills with her hands. She has trouble buttoning shirts and texting. She also reports imbalance. Her son gets worried when she is on a ladder because she cannot keep her foot. She also has trouble going up stairs and requires a hand rail otherwise she falls. She has had these symptoms for a couple of years now and they have gotten worse. These symptoms were not present prior to her ACDF. She has decreased sensation in her bilateral hands. No paresthesias. No numbness proximal to the hands.   mJOA score - 13 -  4 in UE motor -4 for LE motor -2 for sensation -3 for sphincter (unclear because she has had chronic urinary incontinence)  Previous treatments: NSAIDs, tylenol activity modification, gabapentin   COPY OF FIRST  NOTE Patient is a 66 y.o. female who presents today for lumbar spine.  Patient reports symptoms of significant lower back pain that radiates into her left leg.  The pain radiates down the posterior aspect of her leg all the way down to the foot.  She has had the symptoms for about 2 months.  They have gotten progressively worse with time.  She notices it is worse with activity and improves with rest.  However, she still feels it at rest.  She feels that both of her legs are weaker.  She has not had any falls.  Her left leg feels weaker than her right.  She has a 2-year history of urinary incontinence.  She states that she has no control and so when she leaves the house she just does not drink fluids. There was no trauma or injury that brought on the pain. No right leg symptoms.    Has been seeing podiatry for unrelated MTP left sided pain and left lateral ankle pain.     Weakness: Yes, reports both legs feel weak (left greater than right) Symptoms of imbalance: sometimes because her legs feel weak Paresthesias and numbness: yes, down the back of her left leg Bowel or bladder incontinence: yes, 2 year history of urinary incontinence. No bowel incontinence Saddle anesthesia: denies END OF COPY  Physical Exam:  General: no acute distress, appears stated age Neurologic: alert, answering questions appropriately, following commands Respiratory: unlabored breathing on room air, symmetric chest rise Psychiatric: appropriate affect, normal cadence to speech   MSK (spine):  -Has well healed left sided surgical incision over the anterior neck  -Strength exam      Left  Right Grip strength               5/5  5/5 Interosseus   5/5   5/5 Wrist extension  5/5  5/5 Wrist flexion   5/5  5/5 Elbow flexion   5/5  5/5 Deltoid    5/5  5/5  EHL    5/5  5/5 TA    5/5  5/5 GSC    5/5  5/5 Knee extension  5/5  5/5 Hip flexion   5/5  5/5  -Sensory exam    Sensation intact to light touch in L3-S1  nerve distributions of bilateral lower extremities  Sensation intact to light touch in C5-T1 nerve distributions of bilateral upper extremities (decreased in her hands bilaterally)  -Brachioradialis DTR: 2/4 on the left, 2/4 on the right -Biceps DTR: 2/4 on the left, 2/4 on the right -Achilles DTR: 1/4 on the left, 1/4 on the right -Patellar tendon DTR: 1/4 on the left, 1/4 on the right  -Spurling: negative bilaterally -Hoffman sign: negative bilaterally -Clonus: no beats bilaterally -Interosseous wasting: none seen -Grip and release test: negative -Romberg: positive  -Gait: wide based -Imbalance with tandem gait: yes  Tinel's at wrist: negative bilaterally Durkan's: negative bilaterally  Tinel's at elbow: negative bilaterally  Imaging: XR of the cervical spine from 11/2017 was independently reviewed and interpreted, showing C4-7 ACDF with anterior instrumentation. No lucency around the instrumentation. Screws in appropriate position. Interbody devices in the former disc spaces.  CT of the cervical spine from 12/16/2020 was independently reviewed and interpreted, showing ACDF from C4-C7 with no lucency around the instrumentation.  Fusion masses across the former disc spaces. No fracture or dislocation. No OPLL.   MRI of the cervical spine from 12/22/2021 was independently reviewed and interpreted, showing central stenosis at C3/4. There is both anterior and posterior compression at that level.     Patient name: Christine Hawkins Patient MRN: [id number] Date of visit: 01/11/22

## 2022-01-17 ENCOUNTER — Ambulatory Visit
Admission: RE | Admit: 2022-01-17 | Discharge: 2022-01-17 | Disposition: A | Discharge status: Home / Self Care | Payer: Medicare Other | Source: Ambulatory Visit | Attending: Emergency Medicine | Admitting: Emergency Medicine

## 2022-01-17 DIAGNOSIS — Z1231 Encounter for screening mammogram for malignant neoplasm of breast: Secondary | ICD-10-CM

## 2022-01-20 ENCOUNTER — Telehealth: Payer: Self-pay | Admitting: Orthopedic Surgery

## 2022-01-20 NOTE — Telephone Encounter (Signed)
Pt son called in requesting to schedule surgery appt... Pt son stated that he needs schedule surgery date so he can request off for those days... Pt son requesting callback

## 2022-01-21 ENCOUNTER — Telehealth: Payer: Self-pay | Admitting: Orthopedic Surgery

## 2022-01-21 NOTE — Telephone Encounter (Addendum)
Son calling requesting a call back to schedule surgery for patient. I do not have a surgery sheet for patient. Patient was also to have x-rays, and have then reviewed before surgery was scheduled. Son states he can bring patient here for x-rays. Please call to advise son when to bring patient in for x-rays.Marland Kitchen

## 2022-01-21 NOTE — Telephone Encounter (Signed)
I spoke with son. Patient needs to have c-spine x-rays done, and doctor needs to review those before surgery can be scheduled. A message has been sent to Dr. Laurance Flatten in regards to this. Son will be contacted in reference to x-rays.

## 2022-01-24 NOTE — Telephone Encounter (Signed)
She is scheduled for 12/20 @ 2pm for Xrays on the Nurse schedule

## 2022-01-24 NOTE — Addendum Note (Signed)
Addended by: Ileene Rubens on: 01/24/2022 11:43 AM   Modules accepted: Orders

## 2022-01-26 ENCOUNTER — Ambulatory Visit: Payer: Medicare Other

## 2022-01-27 ENCOUNTER — Ambulatory Visit (INDEPENDENT_AMBULATORY_CARE_PROVIDER_SITE_OTHER): Payer: Medicare Other | Admitting: Radiology

## 2022-01-27 ENCOUNTER — Ambulatory Visit (INDEPENDENT_AMBULATORY_CARE_PROVIDER_SITE_OTHER): Payer: Medicare Other

## 2022-01-27 DIAGNOSIS — M4712 Other spondylosis with myelopathy, cervical region: Secondary | ICD-10-CM | POA: Diagnosis not present

## 2022-02-23 ENCOUNTER — Other Ambulatory Visit: Payer: Self-pay | Admitting: Neurosurgery

## 2022-03-10 ENCOUNTER — Other Ambulatory Visit: Payer: Self-pay | Admitting: Neurosurgery

## 2022-03-10 ENCOUNTER — Encounter (HOSPITAL_COMMUNITY): Payer: Self-pay

## 2022-03-10 NOTE — Progress Notes (Signed)
Surgical Instructions    Your procedure is scheduled on Thursday, March 17, 2022.  Report to [facility] Main Entrance "A" at 5:30 A.M., then check in with the Admitting office.  Call this number if you have problems the morning of surgery:  [phone]   If you have any questions prior to your surgery date call [phone]: Open Monday-Friday 8am-4pm If you experience any cold or flu symptoms such as cough, fever, chills, shortness of breath, etc. between now and your scheduled surgery, please notify us at the above number     Remember:  Do not eat or drink after midnight the night before your surgery    Take these medicines the morning of surgery with A SIP OF WATER:  azelastine (OPTIVAR)  ezetimibe (ZETIA)  loratadine (CLARITIN)  triamcinolone (NASACORT)  rosuvastatin (CRESTOR)   As Needed: Acetaminophen (TYLENOL PO)   Follow your surgeon's instructions on when to stop Aspirin.  If no instructions were given by your surgeon then you will need to call the office to get those instructions.     As of today, STOP taking any Aleve, Naproxen, Ibuprofen, Motrin, Advil, Goody's, BC's, all herbal medications, fish oil, and all vitamins.           Do not wear jewelry or makeup. Do not wear lotions, powders, perfumes or deodorant. Do not shave 48 hours prior to surgery.  Do not bring valuables to the hospital. Do not wear nail polish, gel polish, artificial nails, or any other type of covering on natural nails (fingers and toes) If you have artificial nails or gel coating that need to be removed by a nail salon, please have this removed prior to surgery. Artificial nails or gel coating may interfere with anesthesia's ability to adequately monitor your vital signs.  [facility] is not responsible for any belongings or valuables.    Do NOT Smoke (Tobacco/Vaping)  24 hours prior to your procedure  If you use a CPAP at night, you may bring your mask for your overnight stay.    Contacts, glasses, hearing aids, dentures or partials may not be worn into surgery, please bring cases for these belongings   For patients admitted to the hospital, discharge time will be determined by your treatment team.   Patients discharged the day of surgery will not be allowed to drive home, and someone needs to stay with them for 24 hours.   SURGICAL WAITING ROOM VISITATION Patients having surgery or a procedure may have no more than 2 support people in the waiting area - these visitors may rotate.   Children under the age of 57 must have an adult with them who is not the patient. If the patient needs to stay at the hospital during part of their recovery, the visitor guidelines for inpatient rooms apply. Pre-op nurse will coordinate an appropriate time for 1 support person to accompany patient in pre-op.  This support person may not rotate.   Please refer to [email or web address] for the visitor guidelines for Inpatients (after your surgery is over and you are in a regular room).    Special instructions:    Oral Hygiene is also important to reduce your risk of infection.  Remember - BRUSH YOUR TEETH THE MORNING OF SURGERY WITH YOUR REGULAR TOOTHPASTE   [facility]- Preparing For Surgery  Before surgery, you can play an important role. Because skin is not sterile, your skin needs to be as free of germs as possible. You can reduce the number of germs  on your skin by washing with CHG (chlorahexidine gluconate) Soap before surgery.  CHG is an antiseptic cleaner which kills germs and bonds with the skin to continue killing germs even after washing.     Please do not use if you have an allergy to CHG or antibacterial soaps. If your skin becomes reddened/irritated stop using the CHG.  Do not shave (including legs and underarms) for at least 48 hours prior to first CHG shower. It is OK to shave your face.  Please follow these instructions  carefully.     Shower the NIGHT BEFORE SURGERY and the MORNING OF SURGERY with CHG Soap.   If you chose to wash your hair, wash your hair first as usual with your normal shampoo. After you shampoo, rinse your hair and body thoroughly to remove the shampoo.  Then [employer] and genitals (private parts) with your normal soap and rinse thoroughly to remove soap.  After that Use CHG Soap as you would any other liquid soap. You can apply CHG directly to the skin and wash gently with a scrungie or a clean washcloth.   Apply the CHG Soap to your body ONLY FROM THE NECK DOWN.  Do not use on open wounds or open sores. Avoid contact with your eyes, ears, mouth and genitals (private parts). Wash Face and genitals (private parts)  with your normal soap.   Wash thoroughly, paying special attention to the area where your surgery will be performed.  Thoroughly rinse your body with warm water from the neck down.  DO NOT shower/wash with your normal soap after using and rinsing off the CHG Soap.  Pat yourself dry with a CLEAN TOWEL.  Wear CLEAN PAJAMAS to bed the night before surgery  Place CLEAN SHEETS on your bed the night before your surgery  DO NOT SLEEP WITH PETS.   Day of Surgery:  Take a shower with CHG soap. Wear Clean/Comfortable clothing the morning of surgery Do not apply any deodorants/lotions.   Remember to brush your teeth WITH YOUR REGULAR TOOTHPASTE.    If you received a COVID test during your pre-op visit, it is requested that you wear a mask when out in public, stay away from anyone that may not be feeling well, and notify your surgeon if you develop symptoms. If you have been in contact with anyone that has tested positive in the last 10 days, please notify your surgeon.    Please read over the following fact sheets that you were given.

## 2022-03-10 NOTE — Progress Notes (Signed)
Surgical Instructions    Your procedure is scheduled on Thursday, March 17, 2022.  Report to [facility] Main Entrance "A" at 5:30 A.M., then check in with the Admitting office.  Call this number if you have problems the morning of surgery:  [phone]   If you have any questions prior to your surgery date call [phone]: Open Monday-Friday 8am-4pm If you experience any cold or flu symptoms such as cough, fever, chills, shortness of breath, etc. between now and your scheduled surgery, please notify us at the above number     Remember:  Do not eat or drink after midnight the night before your surgery    Take these medicines the morning of surgery with A SIP OF WATER:  azelastine (OPTIVAR)  ezetimibe (ZETIA)  loratadine (CLARITIN)  triamcinolone (NASACORT)  triamcinolone (NASACORT)   As Needed: Acetaminophen (TYLENOL PO)   Follow your surgeon's instructions on when to stop Aspirin.  If no instructions were given by your surgeon then you will need to call the office to get those instructions.     As of today, STOP taking any Aleve, Naproxen, Ibuprofen, Motrin, Advil, Goody's, BC's, all herbal medications, fish oil, and all vitamins.           Do not wear jewelry or makeup. Do not wear lotions, powders, perfumes or deodorant. Do not shave 48 hours prior to surgery.  Do not bring valuables to the hospital. Do not wear nail polish, gel polish, artificial nails, or any other type of covering on natural nails (fingers and toes) If you have artificial nails or gel coating that need to be removed by a nail salon, please have this removed prior to surgery. Artificial nails or gel coating may interfere with anesthesia's ability to adequately monitor your vital signs.  [facility] is not responsible for any belongings or valuables.    Do NOT Smoke (Tobacco/Vaping)  24 hours prior to your procedure  If you use a CPAP at night, you may bring your mask for your overnight stay.    Contacts, glasses, hearing aids, dentures or partials may not be worn into surgery, please bring cases for these belongings   For patients admitted to the hospital, discharge time will be determined by your treatment team.   Patients discharged the day of surgery will not be allowed to drive home, and someone needs to stay with them for 24 hours.   SURGICAL WAITING ROOM VISITATION Patients having surgery or a procedure may have no more than 2 support people in the waiting area - these visitors may rotate.   Children under the age of 39 must have an adult with them who is not the patient. If the patient needs to stay at the hospital during part of their recovery, the visitor guidelines for inpatient rooms apply. Pre-op nurse will coordinate an appropriate time for 1 support person to accompany patient in pre-op.  This support person may not rotate.   Please refer to [email or web address] for the visitor guidelines for Inpatients (after your surgery is over and you are in a regular room).    Special instructions:    Oral Hygiene is also important to reduce your risk of infection.  Remember - BRUSH YOUR TEETH THE MORNING OF SURGERY WITH YOUR REGULAR TOOTHPASTE   - Preparing For Surgery  Before surgery, you can play an important role. Because skin is not sterile, your skin needs to be as free of germs as possible. You can reduce the number of germs  on your skin by washing with CHG (chlorahexidine gluconate) Soap before surgery.  CHG is an antiseptic cleaner which kills germs and bonds with the skin to continue killing germs even after washing.     Please do not use if you have an allergy to CHG or antibacterial soaps. If your skin becomes reddened/irritated stop using the CHG.  Do not shave (including legs and underarms) for at least 48 hours prior to first CHG shower. It is OK to shave your face.  Please follow these instructions  carefully.     Shower the NIGHT BEFORE SURGERY and the MORNING OF SURGERY with CHG Soap.   If you chose to wash your hair, wash your hair first as usual with your normal shampoo. After you shampoo, rinse your hair and body thoroughly to remove the shampoo.  Then [employer] and genitals (private parts) with your normal soap and rinse thoroughly to remove soap.  After that Use CHG Soap as you would any other liquid soap. You can apply CHG directly to the skin and wash gently with a scrungie or a clean washcloth.   Apply the CHG Soap to your body ONLY FROM THE NECK DOWN.  Do not use on open wounds or open sores. Avoid contact with your eyes, ears, mouth and genitals (private parts). Wash Face and genitals (private parts)  with your normal soap.   Wash thoroughly, paying special attention to the area where your surgery will be performed.  Thoroughly rinse your body with warm water from the neck down.  DO NOT shower/wash with your normal soap after using and rinsing off the CHG Soap.  Pat yourself dry with a CLEAN TOWEL.  Wear CLEAN PAJAMAS to bed the night before surgery  Place CLEAN SHEETS on your bed the night before your surgery  DO NOT SLEEP WITH PETS.   Day of Surgery:  Take a shower with CHG soap. Wear Clean/Comfortable clothing the morning of surgery Do not apply any deodorants/lotions.   Remember to brush your teeth WITH YOUR REGULAR TOOTHPASTE.    If you received a COVID test during your pre-op visit, it is requested that you wear a mask when out in public, stay away from anyone that may not be feeling well, and notify your surgeon if you develop symptoms. If you have been in contact with anyone that has tested positive in the last 10 days, please notify your surgeon.    Please read over the following fact sheets that you were given.

## 2022-03-11 ENCOUNTER — Encounter (HOSPITAL_COMMUNITY): Payer: Self-pay

## 2022-03-11 ENCOUNTER — Other Ambulatory Visit: Payer: Self-pay

## 2022-03-11 ENCOUNTER — Encounter (HOSPITAL_COMMUNITY)
Admission: RE | Admit: 2022-03-11 | Discharge: 2022-03-11 | Disposition: A | Discharge status: Home / Self Care | Payer: 59 | Source: Ambulatory Visit | Attending: Neurosurgery | Admitting: Neurosurgery

## 2022-03-11 VITALS — BP 131/55 | HR 56 | Temp 97.6°F | Resp 18 | Ht 62.0 in | Wt 167.7 lb

## 2022-03-11 DIAGNOSIS — E7800 Pure hypercholesterolemia, unspecified: Secondary | ICD-10-CM | POA: Insufficient documentation

## 2022-03-11 DIAGNOSIS — Z01818 Encounter for other preprocedural examination: Secondary | ICD-10-CM | POA: Diagnosis present

## 2022-03-11 DIAGNOSIS — Z7982 Long term (current) use of aspirin: Secondary | ICD-10-CM | POA: Diagnosis not present

## 2022-03-11 DIAGNOSIS — M4712 Other spondylosis with myelopathy, cervical region: Secondary | ICD-10-CM | POA: Diagnosis not present

## 2022-03-11 DIAGNOSIS — K219 Gastro-esophageal reflux disease without esophagitis: Secondary | ICD-10-CM | POA: Insufficient documentation

## 2022-03-11 DIAGNOSIS — R7303 Prediabetes: Secondary | ICD-10-CM | POA: Diagnosis not present

## 2022-03-11 DIAGNOSIS — I491 Atrial premature depolarization: Secondary | ICD-10-CM | POA: Insufficient documentation

## 2022-03-11 DIAGNOSIS — I10 Essential (primary) hypertension: Secondary | ICD-10-CM | POA: Diagnosis not present

## 2022-03-11 DIAGNOSIS — M48061 Spinal stenosis, lumbar region without neurogenic claudication: Secondary | ICD-10-CM | POA: Diagnosis not present

## 2022-03-11 HISTORY — DX: Nausea with vomiting, unspecified: Z98.890

## 2022-03-11 HISTORY — DX: Essential (primary) hypertension: I10

## 2022-03-11 HISTORY — DX: Other specified postprocedural states: R11.2

## 2022-03-11 HISTORY — DX: Prediabetes: R73.03

## 2022-03-11 LAB — CBC
HCT: 42.9 % (ref 36.0–46.0)
Hemoglobin: 13.1 g/dL (ref 12.0–15.0)
MCH: 26.7 pg (ref 26.0–34.0)
MCHC: 30.5 g/dL (ref 30.0–36.0)
MCV: 87.6 fL (ref 80.0–100.0)
Platelets: 192 10*3/uL (ref 150–400)
RBC: 4.9 MIL/uL (ref 3.87–5.11)
RDW: 13.4 % (ref 11.5–15.5)
WBC: 6.6 10*3/uL (ref 4.0–10.5)
nRBC: 0 % (ref 0.0–0.2)

## 2022-03-11 LAB — BASIC METABOLIC PANEL
Anion gap: 7 (ref 5–15)
BUN: 9 mg/dL (ref 8–23)
CO2: 29 mmol/L (ref 22–32)
Calcium: 9.7 mg/dL (ref 8.9–10.3)
Chloride: 104 mmol/L (ref 98–111)
Creatinine, Ser: 0.76 mg/dL (ref 0.44–1.00)
GFR, Estimated: >60 mL/min (ref >60)
Glucose, Bld: 108 mg/dL — ABNORMAL HIGH (ref 70–99)
Potassium: 4.2 mmol/L (ref 3.5–5.1)
Sodium: 140 mmol/L (ref 135–145)

## 2022-03-11 LAB — SURGICAL PCR SCREEN
MRSA, PCR: NEGATIVE
Staphylococcus aureus: NEGATIVE

## 2022-03-11 LAB — TYPE AND SCREEN
ABO/RH(D): O POS
Antibody Screen: NEGATIVE

## 2022-03-11 NOTE — Progress Notes (Addendum)
PAT visit completed today using patient's son-Angel- per patient's request as interpreter. Interpreter waiver signed by patient and son-placed in chart.  PCP - [facility] Cardiologist - Dr.Robert Agustin Cree  PPM/ICD - pt denies Device Orders - n/a Rep Notified - n/a  Chest x-ray - 04/15/21 EKG - 03/11/22 Stress Test - 09/08/20 ECHO - pt denies Cardiac Cath - 06/07/2012  Sleep Study - pt denies CPAP - n/a Pre-Diabetic per pt. Fasting Blood Sugar - 94 Checks Blood Sugar once a month per pt  Last dose of GLP1 agonist-  pt denies GLP1 instructions: n/a  Blood Thinner Instructions:pt denies Aspirin Instructions:Follow your surgeon's instructions on when to stop Aspirin.  Per patient-Hold Aspirin 5 days per surgeon.  ERAS Protcol -NPO after Midnight  PRE-SURGERY Ensure or G2- none ordered  COVID TEST- n/a   Anesthesia review: YES, heart history. Abnormal EKG.  Patient denies shortness of breath, fever, cough and chest pain at PAT appointment   All instructions explained to the patient, with a verbal understanding of the material. Patient agrees to go over the instructions while at home for a better understanding. Patient also instructed to self quarantine after being tested for COVID-19. The opportunity to ask questions was provided.

## 2022-03-14 NOTE — Anesthesia Preprocedure Evaluation (Signed)
Anesthesia Evaluation  Patient identified by MRN, date of birth, ID band Patient awake    Reviewed: Allergy & Precautions, H&P , NPO status , Patient's Chart, lab work & pertinent test results  Airway Mallampati: II  TM Distance: >3 FB Neck ROM: Full    Dental no notable dental hx.    Pulmonary neg pulmonary ROS   Pulmonary exam normal breath sounds clear to auscultation       Cardiovascular hypertension, Normal cardiovascular exam Rhythm:Regular Rate:Normal     Neuro/Psych negative neurological ROS  negative psych ROS   GI/Hepatic Neg liver ROS,GERD  ,,  Endo/Other  negative endocrine ROS    Renal/GU negative Renal ROS  negative genitourinary   Musculoskeletal negative musculoskeletal ROS (+)    Abdominal   Peds negative pediatric ROS (+)  Hematology negative hematology ROS (+)   Anesthesia Other Findings   Reproductive/Obstetrics negative OB ROS                             Anesthesia Physical Anesthesia Plan  ASA: 2  Anesthesia Plan: General   Post-op Pain Management:    Induction: Intravenous  PONV Risk Score and Plan: 3 and Ondansetron, Dexamethasone and Treatment may vary due to age or medical condition  Airway Management Planned: Oral ETT  Additional Equipment:   Intra-op Plan:   Post-operative Plan: Extubation in OR  Informed Consent: I have reviewed the patients History and Physical, chart, labs and discussed the procedure including the risks, benefits and alternatives for the proposed anesthesia with the patient or authorized representative who has indicated his/her understanding and acceptance.     Dental advisory given  Plan Discussed with: CRNA and Surgeon  Anesthesia Plan Comments: (PAT note written 03/14/2022 by Myra Gianotti, PA-C.  )       Anesthesia Quick Evaluation

## 2022-03-14 NOTE — Progress Notes (Signed)
Anesthesia Chart Review:  Case: [id number] Date/Time: 03/17/22 0715   Procedure: ACDF C34,REM CERVICAL PLATE - 3C   Anesthesia type: General   Pre-op diagnosis: CERVICAL SPONDYLOSIS WITH MYELOPATHY   Location: [facility] OR ROOM 21 / [place] OR   Surgeons: Vallarie Mare, MD       DISCUSSION: Patient is a 67 year old female scheduled for the above procedure. She is Spanish speaking.  History includes never smoker, post-operative N/V, hypercholesterolemia, GERD, HTN, pre-diabetes, spinal surgery (C4-7 ACDF 04/27/11).   Last cardiology visit with Dr. Agustin Cree was on 08/02/21 with 12 month follow-up recommended. She had a normal stress test 09/08/20. Dizziness improved. Carotid US repeated and showed 1/39% BICA stenosis 08/23/21.   Hold ASA for 5 days prior to surgery per surgeon.   Anesthesia team to evaluate on the day of surgery.   VS: BP (!) 131/55   Pulse (!) 56   Temp 36.4 C (Oral)   Resp 18   Ht '5\' 2"'$  (1.575 m)   Wt 76.1 kg   SpO2 100%   BMI 30.67 kg/m   PROVIDERS: Horald Pollen, MD is PCP  Jenne Campus, MD is cardiologist   LABS: Labs reviewed: Acceptable for surgery. A1c 6.5% 04/15/21.  (all labs ordered are listed, but only abnormal results are displayed)  Labs Reviewed  BASIC METABOLIC PANEL - Abnormal; Notable for the following components:      Result Value   Glucose, Bld 108 (*)    All other components within normal limits  SURGICAL PCR SCREEN  CBC  TYPE AND SCREEN    IMAGES: MRI L-spine 12/10/21: IMPRESSION: 1. Multilevel lumbar disc and facet degeneration, most notable at L4-5 where there is severe spinal stenosis. 2. Moderate lateral recess stenosis and mild-to-moderate neural foraminal stenosis at L5-S1. 3. Mild-to-moderate spinal stenosis at L3-4.    EKG: 03/11/22: Sinus bradycardia with Premature atrial complexes Left axis deviation Moderate voltage criteria for LVH, may be normal variant ( R in aVL , Cornell product ) Abnormal ECG No previous  ECGs available Confirmed by Cristopher Peru [phone]) on 03/12/2022 7:00:56 AM   CV: US Carotid 08/23/21: Summary:  - Right Carotid: Velocities in the right ICA are consistent with a 1-39% stenosis.  - Left Carotid: Velocities in the left ICA are consistent with a 1-39% stenosis.  - Vertebrals: Left vertebral artery demonstrates antegrade flow. Right  vertebral artery demonstrates no discernable flow.  - Subclavians: Normal flow hemodynamics were seen in bilateral subclavian arteries.   Nuclear stress test 09/08/20: There was no ST segment deviation noted during stress. The study is normal. This is a low risk study. The left ventricular ejection fraction is normal (55-65%).   Cardiac cath 06/07/12: Recommendation: Normal coronary arteries, codominant circulation, catheter induced spasm of the ostium of the right coronary artery. I did not attempt to see engage the RCA due to the fact she had significant tension buildup across the catheter and port was difficult. Hence did not want to injure the ostium of the RCA. If she continues to have recurrent chest pain, would consider repeat coronary angiography with probable IVUS. Patient is under extreme distress due to  social issues at home, will continue to monitor her closely.   Past Medical History:  Diagnosis Date   Anxiety    Colon polyps    Depression    GERD (gastroesophageal reflux disease)    High cholesterol    Hypertension    Osteopenia    PONV (postoperative nausea and vomiting)  Pre-diabetes     Past Surgical History:  Procedure Laterality Date   BREAST LUMPECTOMY Left    CERVICAL SPINE SURGERY      2013 has 8 screws    CESAREAN SECTION     COLONOSCOPY  08/26/2009   Normal colonoscopy up to cecum    LEFT HEART CATHETERIZATION WITH CORONARY ANGIOGRAM N/A 06/07/2012   Procedure: LEFT HEART CATHETERIZATION WITH CORONARY ANGIOGRAM;  Surgeon: Laverda Page, MD;  Location: [facility] CATH LAB;  Service: Cardiovascular;  Laterality: N/A;     MEDICATIONS:  acetaminophen (TYLENOL) 500 MG tablet   acetaminophen (TYLENOL) 650 MG CR tablet   aspirin EC 81 MG tablet   azelastine (OPTIVAR) 0.05 % ophthalmic solution   Cholecalciferol (VITAMIN D-3 PO)   [employer] COENZYME Q10 PO   ezetimibe (ZETIA) 10 MG tablet   gabapentin (NEURONTIN) 100 MG capsule   Ginger, Zingiber officinalis, (GINGER PO)   loratadine (CLARITIN) 10 MG tablet   Multiple Vitamins-Minerals (CENTRUM SILVER 50+WOMEN) TABS   mupirocin ointment (BACTROBAN) 2 %   rosuvastatin (CRESTOR) 40 MG tablet   triamcinolone (NASACORT) 55 MCG/ACT AERO nasal inhaler   VITAMIN E PO   No current facility-administered medications for this encounter.    Myra Gianotti, PA-C Surgical Short Stay/Anesthesiology [facility] Phone [phone] [facility] Phone [phone] 03/14/2022 3:34 PM

## 2022-03-17 ENCOUNTER — Other Ambulatory Visit: Payer: Self-pay

## 2022-03-17 ENCOUNTER — Inpatient Hospital Stay (HOSPITAL_COMMUNITY): Payer: 59

## 2022-03-17 ENCOUNTER — Inpatient Hospital Stay (HOSPITAL_COMMUNITY)
Admission: RE | Admit: 2022-03-17 | Discharge: 2022-03-18 | DRG: 472 | Disposition: A | Discharge status: Home / Self Care | Payer: 59 | Attending: Neurosurgery | Admitting: Neurosurgery

## 2022-03-17 ENCOUNTER — Inpatient Hospital Stay (HOSPITAL_COMMUNITY): Payer: 59 | Admitting: Anesthesiology

## 2022-03-17 ENCOUNTER — Inpatient Hospital Stay (HOSPITAL_COMMUNITY): Payer: 59 | Admitting: Vascular Surgery

## 2022-03-17 ENCOUNTER — Encounter (HOSPITAL_COMMUNITY): Payer: Self-pay

## 2022-03-17 ENCOUNTER — Encounter (HOSPITAL_COMMUNITY)
Admission: RE | Disposition: A | Discharge status: Home / Self Care | Payer: Self-pay | Source: Home / Self Care | Attending: Neurosurgery

## 2022-03-17 DIAGNOSIS — M4802 Spinal stenosis, cervical region: Secondary | ICD-10-CM | POA: Diagnosis present

## 2022-03-17 DIAGNOSIS — Z833 Family history of diabetes mellitus: Secondary | ICD-10-CM

## 2022-03-17 DIAGNOSIS — E7800 Pure hypercholesterolemia, unspecified: Secondary | ICD-10-CM | POA: Diagnosis present

## 2022-03-17 DIAGNOSIS — Z800 Family history of malignant neoplasm of digestive organs: Secondary | ICD-10-CM

## 2022-03-17 DIAGNOSIS — Z91048 Other nonmedicinal substance allergy status: Secondary | ICD-10-CM | POA: Diagnosis not present

## 2022-03-17 DIAGNOSIS — Z8249 Family history of ischemic heart disease and other diseases of the circulatory system: Secondary | ICD-10-CM

## 2022-03-17 DIAGNOSIS — Z79899 Other long term (current) drug therapy: Secondary | ICD-10-CM

## 2022-03-17 DIAGNOSIS — M5412 Radiculopathy, cervical region: Secondary | ICD-10-CM | POA: Diagnosis present

## 2022-03-17 DIAGNOSIS — Z888 Allergy status to other drugs, medicaments and biological substances status: Secondary | ICD-10-CM

## 2022-03-17 DIAGNOSIS — E739 Lactose intolerance, unspecified: Secondary | ICD-10-CM | POA: Diagnosis present

## 2022-03-17 DIAGNOSIS — I10 Essential (primary) hypertension: Secondary | ICD-10-CM | POA: Diagnosis present

## 2022-03-17 DIAGNOSIS — R7303 Prediabetes: Secondary | ICD-10-CM | POA: Diagnosis present

## 2022-03-17 DIAGNOSIS — K219 Gastro-esophageal reflux disease without esophagitis: Secondary | ICD-10-CM | POA: Diagnosis present

## 2022-03-17 DIAGNOSIS — Z7982 Long term (current) use of aspirin: Secondary | ICD-10-CM

## 2022-03-17 DIAGNOSIS — M5091 Cervical disc disorder, unspecified,  high cervical region: Secondary | ICD-10-CM

## 2022-03-17 DIAGNOSIS — Z981 Arthrodesis status: Secondary | ICD-10-CM

## 2022-03-17 DIAGNOSIS — G992 Myelopathy in diseases classified elsewhere: Secondary | ICD-10-CM | POA: Diagnosis present

## 2022-03-17 HISTORY — PX: ANTERIOR CERVICAL DECOMP/DISCECTOMY FUSION: SHX1161

## 2022-03-17 LAB — ABO/RH: ABO/RH(D): O POS

## 2022-03-17 SURGERY — ANTERIOR CERVICAL DECOMPRESSION/DISCECTOMY FUSION 1 LEVEL/HARDWARE REMOVAL
Anesthesia: General | Site: Neck

## 2022-03-17 MED ORDER — GLYCOPYRROLATE 0.2 MG/ML IJ SOLN
INTRAMUSCULAR | Status: DC | PRN
  Administered 2022-03-17: .2 mg via INTRAVENOUS

## 2022-03-17 MED ORDER — OXYCODONE-ACETAMINOPHEN 5-325 MG PO TABS: 1.0000 | ORAL_TABLET | Freq: Four times a day (QID) | ORAL | 0 refills | Status: DC | PRN

## 2022-03-17 MED ORDER — OXYCODONE HCL 5 MG PO TABS: 5.0000 mg | ORAL_TABLET | ORAL | Status: DC | PRN

## 2022-03-17 MED ORDER — METHOCARBAMOL 1000 MG/10ML IJ SOLN: 500.0000 mg | Freq: Four times a day (QID) | INTRAVENOUS | Status: DC | PRN

## 2022-03-17 MED ORDER — ASPIRIN 81 MG PO TBEC
81.0000 mg | DELAYED_RELEASE_TABLET | Freq: Every day | ORAL | Status: DC
  Administered 2022-03-17 – 2022-03-18 (×2): 81 mg via ORAL
  Filled 2022-03-17 (×2): qty 1

## 2022-03-17 MED ORDER — ACETAMINOPHEN 650 MG RE SUPP: 650.0000 mg | RECTAL | Status: DC | PRN

## 2022-03-17 MED ORDER — ACETAMINOPHEN 10 MG/ML IV SOLN
INTRAVENOUS | Status: AC
  Filled 2022-03-17: qty 100

## 2022-03-17 MED ORDER — LACTATED RINGERS IV SOLN: INTRAVENOUS | Status: DC

## 2022-03-17 MED ORDER — ROSUVASTATIN CALCIUM 20 MG PO TABS
40.0000 mg | ORAL_TABLET | Freq: Every day | ORAL | Status: DC
  Administered 2022-03-17 – 2022-03-18 (×2): 40 mg via ORAL
  Filled 2022-03-17 (×2): qty 2

## 2022-03-17 MED ORDER — KETOTIFEN FUMARATE 0.035 % OP SOLN
1.0000 [drp] | Freq: Every day | OPHTHALMIC | Status: DC
  Filled 2022-03-17: qty 5

## 2022-03-17 MED ORDER — DOCUSATE SODIUM 100 MG PO CAPS
100.0000 mg | ORAL_CAPSULE | Freq: Two times a day (BID) | ORAL | Status: DC
  Administered 2022-03-17 – 2022-03-18 (×3): 100 mg via ORAL
  Filled 2022-03-17 (×3): qty 1

## 2022-03-17 MED ORDER — HYDROMORPHONE HCL 1 MG/ML IJ SOLN
INTRAMUSCULAR | Status: AC
  Filled 2022-03-17: qty 1

## 2022-03-17 MED ORDER — LIDOCAINE-EPINEPHRINE 1 %-1:100000 IJ SOLN
INTRAMUSCULAR | Status: AC
  Filled 2022-03-17: qty 1

## 2022-03-17 MED ORDER — SODIUM CHLORIDE 0.9% FLUSH
3.0000 mL | Freq: Two times a day (BID) | INTRAVENOUS | Status: DC
  Administered 2022-03-17: 3 mL via INTRAVENOUS

## 2022-03-17 MED ORDER — ACETAMINOPHEN 10 MG/ML IV SOLN
INTRAVENOUS | Status: DC | PRN
  Administered 2022-03-17: 1000 mg via INTRAVENOUS

## 2022-03-17 MED ORDER — ROCURONIUM BROMIDE 10 MG/ML (PF) SYRINGE
PREFILLED_SYRINGE | INTRAVENOUS | Status: DC | PRN
  Administered 2022-03-17: 60 mg via INTRAVENOUS
  Administered 2022-03-17: 10 mg via INTRAVENOUS
  Administered 2022-03-17: 20 mg via INTRAVENOUS

## 2022-03-17 MED ORDER — THROMBIN 5000 UNITS EX SOLR
OROMUCOSAL | Status: DC | PRN
  Administered 2022-03-17: 5 mL via TOPICAL

## 2022-03-17 MED ORDER — EZETIMIBE 10 MG PO TABS
10.0000 mg | ORAL_TABLET | Freq: Every day | ORAL | Status: DC
  Administered 2022-03-17 – 2022-03-18 (×2): 10 mg via ORAL
  Filled 2022-03-17 (×2): qty 1

## 2022-03-17 MED ORDER — LORATADINE 10 MG PO TABS
10.0000 mg | ORAL_TABLET | Freq: Every day | ORAL | Status: DC
  Administered 2022-03-17 – 2022-03-18 (×2): 10 mg via ORAL
  Filled 2022-03-17 (×2): qty 1

## 2022-03-17 MED ORDER — PHENYLEPHRINE 80 MCG/ML (10ML) SYRINGE FOR IV PUSH (FOR BLOOD PRESSURE SUPPORT)
PREFILLED_SYRINGE | INTRAVENOUS | Status: DC | PRN
  Administered 2022-03-17: 160 ug via INTRAVENOUS

## 2022-03-17 MED ORDER — CEFAZOLIN SODIUM-DEXTROSE 2-4 GM/100ML-% IV SOLN
2.0000 g | Freq: Three times a day (TID) | INTRAVENOUS | Status: AC
  Administered 2022-03-17 (×2): 2 g via INTRAVENOUS
  Filled 2022-03-17 (×2): qty 100

## 2022-03-17 MED ORDER — HYDROMORPHONE HCL 1 MG/ML IJ SOLN
0.5000 mg | INTRAMUSCULAR | Status: DC | PRN
  Administered 2022-03-17: 0.5 mg via INTRAVENOUS
  Filled 2022-03-17: qty 0.5

## 2022-03-17 MED ORDER — FENTANYL CITRATE (PF) 250 MCG/5ML IJ SOLN
INTRAMUSCULAR | Status: AC
  Filled 2022-03-17: qty 5

## 2022-03-17 MED ORDER — SODIUM CHLORIDE 0.9 % IV SOLN
250.0000 mL | INTRAVENOUS | Status: DC
  Administered 2022-03-17: 250 mL via INTRAVENOUS

## 2022-03-17 MED ORDER — MENTHOL 3 MG MT LOZG: 1.0000 | LOZENGE | OROMUCOSAL | Status: DC | PRN

## 2022-03-17 MED ORDER — CEFAZOLIN SODIUM-DEXTROSE 2-4 GM/100ML-% IV SOLN
2.0000 g | INTRAVENOUS | Status: AC
  Administered 2022-03-17: 2 g via INTRAVENOUS
  Filled 2022-03-17: qty 100

## 2022-03-17 MED ORDER — METHOCARBAMOL 500 MG PO TABS
500.0000 mg | ORAL_TABLET | Freq: Four times a day (QID) | ORAL | Status: DC | PRN
  Administered 2022-03-17 (×2): 500 mg via ORAL
  Filled 2022-03-17 (×2): qty 1

## 2022-03-17 MED ORDER — PROPOFOL 1000 MG/100ML IV EMUL
INTRAVENOUS | Status: AC
  Filled 2022-03-17: qty 200

## 2022-03-17 MED ORDER — PHENYLEPHRINE HCL-NACL 20-0.9 MG/250ML-% IV SOLN
INTRAVENOUS | Status: DC | PRN
  Administered 2022-03-17: 20 ug/min via INTRAVENOUS

## 2022-03-17 MED ORDER — ONDANSETRON HCL 4 MG/2ML IJ SOLN
INTRAMUSCULAR | Status: DC | PRN
  Administered 2022-03-17: 4 mg via INTRAVENOUS

## 2022-03-17 MED ORDER — FLEET ENEMA 7-19 GM/118ML RE ENEM: 1.0000 | ENEMA | Freq: Once | RECTAL | Status: DC | PRN

## 2022-03-17 MED ORDER — LIDOCAINE-EPINEPHRINE 1 %-1:100000 IJ SOLN
INTRAMUSCULAR | Status: DC | PRN
  Administered 2022-03-17: 4 mL

## 2022-03-17 MED ORDER — ACETAMINOPHEN 10 MG/ML IV SOLN: 1000.0000 mg | Freq: Once | INTRAVENOUS | Status: DC | PRN

## 2022-03-17 MED ORDER — ONDANSETRON HCL 4 MG PO TABS: 4.0000 mg | ORAL_TABLET | Freq: Four times a day (QID) | ORAL | Status: DC | PRN

## 2022-03-17 MED ORDER — EPHEDRINE SULFATE-NACL 50-0.9 MG/10ML-% IV SOSY
PREFILLED_SYRINGE | INTRAVENOUS | Status: DC | PRN
  Administered 2022-03-17: 10 mg via INTRAVENOUS

## 2022-03-17 MED ORDER — PHENOL 1.4 % MT LIQD: 1.0000 | OROMUCOSAL | Status: DC | PRN

## 2022-03-17 MED ORDER — MIDAZOLAM HCL 2 MG/2ML IJ SOLN
INTRAMUSCULAR | Status: DC | PRN
  Administered 2022-03-17: 2 mg via INTRAVENOUS

## 2022-03-17 MED ORDER — PROPOFOL 10 MG/ML IV BOLUS
INTRAVENOUS | Status: DC | PRN
  Administered 2022-03-17: 140 mg via INTRAVENOUS

## 2022-03-17 MED ORDER — HYDROMORPHONE HCL 1 MG/ML IJ SOLN
0.2500 mg | INTRAMUSCULAR | Status: DC | PRN
  Administered 2022-03-17 (×2): 0.25 mg via INTRAVENOUS

## 2022-03-17 MED ORDER — CHLORHEXIDINE GLUCONATE 0.12 % MT SOLN
15.0000 mL | Freq: Once | OROMUCOSAL | Status: AC
  Administered 2022-03-17: 15 mL via OROMUCOSAL
  Filled 2022-03-17: qty 15

## 2022-03-17 MED ORDER — ACETAMINOPHEN 325 MG PO TABS: 650.0000 mg | ORAL_TABLET | ORAL | Status: DC | PRN

## 2022-03-17 MED ORDER — ONDANSETRON HCL 4 MG/2ML IJ SOLN: 4.0000 mg | Freq: Once | INTRAMUSCULAR | Status: DC | PRN

## 2022-03-17 MED ORDER — PROPOFOL 10 MG/ML IV BOLUS
INTRAVENOUS | Status: AC
  Filled 2022-03-17: qty 20

## 2022-03-17 MED ORDER — SUGAMMADEX SODIUM 200 MG/2ML IV SOLN
INTRAVENOUS | Status: DC | PRN
  Administered 2022-03-17: 200 mg via INTRAVENOUS

## 2022-03-17 MED ORDER — THROMBIN 5000 UNITS EX SOLR
CUTANEOUS | Status: AC
  Filled 2022-03-17: qty 5000

## 2022-03-17 MED ORDER — CHLORHEXIDINE GLUCONATE CLOTH 2 % EX PADS: 6.0000 | MEDICATED_PAD | Freq: Once | CUTANEOUS | Status: DC

## 2022-03-17 MED ORDER — OXYCODONE HCL 5 MG PO TABS
10.0000 mg | ORAL_TABLET | ORAL | Status: DC | PRN
  Administered 2022-03-17 – 2022-03-18 (×5): 10 mg via ORAL
  Filled 2022-03-17 (×5): qty 2

## 2022-03-17 MED ORDER — 0.9 % SODIUM CHLORIDE (POUR BTL) OPTIME
TOPICAL | Status: DC | PRN
  Administered 2022-03-17: 1000 mL

## 2022-03-17 MED ORDER — PHENYLEPHRINE HCL-NACL 20-0.9 MG/250ML-% IV SOLN
INTRAVENOUS | Status: AC
  Filled 2022-03-17: qty 750

## 2022-03-17 MED ORDER — FENTANYL CITRATE (PF) 250 MCG/5ML IJ SOLN
INTRAMUSCULAR | Status: DC | PRN
  Administered 2022-03-17: 100 ug via INTRAVENOUS
  Administered 2022-03-17: 25 ug via INTRAVENOUS

## 2022-03-17 MED ORDER — OXYCODONE HCL 5 MG/5ML PO SOLN: 5.0000 mg | Freq: Once | ORAL | Status: DC | PRN

## 2022-03-17 MED ORDER — ADULT MULTIVITAMIN W/MINERALS CH
1.0000 | ORAL_TABLET | Freq: Every day | ORAL | Status: DC
  Administered 2022-03-17 – 2022-03-18 (×2): 1 via ORAL
  Filled 2022-03-17 (×2): qty 1

## 2022-03-17 MED ORDER — DEXAMETHASONE SODIUM PHOSPHATE 10 MG/ML IJ SOLN
INTRAMUSCULAR | Status: DC | PRN
  Administered 2022-03-17: 5 mg via INTRAVENOUS

## 2022-03-17 MED ORDER — SODIUM CHLORIDE 0.9% FLUSH: 3.0000 mL | INTRAVENOUS | Status: DC | PRN

## 2022-03-17 MED ORDER — OXYCODONE HCL 5 MG PO TABS: 5.0000 mg | ORAL_TABLET | Freq: Once | ORAL | Status: DC | PRN

## 2022-03-17 MED ORDER — ONDANSETRON HCL 4 MG/2ML IJ SOLN
4.0000 mg | Freq: Four times a day (QID) | INTRAMUSCULAR | Status: DC | PRN
  Filled 2022-03-17: qty 2

## 2022-03-17 MED ORDER — GABAPENTIN 100 MG PO CAPS
100.0000 mg | ORAL_CAPSULE | Freq: Every day | ORAL | Status: DC
  Administered 2022-03-17: 100 mg via ORAL
  Filled 2022-03-17: qty 1

## 2022-03-17 MED ORDER — LIDOCAINE 2% (20 MG/ML) 5 ML SYRINGE
INTRAMUSCULAR | Status: DC | PRN
  Administered 2022-03-17: 50 mg via INTRAVENOUS

## 2022-03-17 MED ORDER — MIDAZOLAM HCL 2 MG/2ML IJ SOLN
INTRAMUSCULAR | Status: AC
  Filled 2022-03-17: qty 2

## 2022-03-17 MED ORDER — DOCUSATE SODIUM 100 MG PO CAPS: 100.0000 mg | ORAL_CAPSULE | Freq: Two times a day (BID) | ORAL | 2 refills | Status: AC

## 2022-03-17 MED ORDER — ORAL CARE MOUTH RINSE: 15.0000 mL | Freq: Once | OROMUCOSAL | Status: AC

## 2022-03-17 MED ORDER — KETAMINE HCL 50 MG/5ML IJ SOSY
PREFILLED_SYRINGE | INTRAMUSCULAR | Status: AC
  Filled 2022-03-17: qty 5

## 2022-03-17 SURGICAL SUPPLY — 66 items
ADH SKN CLS APL DERMABOND .7 (GAUZE/BANDAGES/DRESSINGS) ×1
APL SKNCLS STERI-STRIP NONHPOA (GAUZE/BANDAGES/DRESSINGS) ×1
BAG COUNTER SPONGE SURGICOUNT (BAG) ×1 IMPLANT
BAG SPNG CNTER NS LX DISP (BAG) ×1
BAND INSRT 18 STRL LF DISP RB (MISCELLANEOUS) ×2
BAND RUBBER #18 3X1/16 STRL (MISCELLANEOUS) ×2 IMPLANT
BASKET BONE COLLECTION (BASKET) IMPLANT
BENZOIN TINCTURE PRP APPL 2/3 (GAUZE/BANDAGES/DRESSINGS) ×1 IMPLANT
BIT DRILL 13 (BIT) IMPLANT
BIT DRILL NEURO 2X3.1 SFT TUCH (MISCELLANEOUS) ×1 IMPLANT
BLADE CLIPPER SURG (BLADE) IMPLANT
BUR MATCHSTICK NEURO 3.0 LAGG (BURR) ×1 IMPLANT
CANISTER SUCT 3000ML PPV (MISCELLANEOUS) ×1 IMPLANT
DERMABOND ADVANCED .7 DNX12 (GAUZE/BANDAGES/DRESSINGS) IMPLANT
DEVICE ENDSKLTN IMPLANT SM 7MM (Cage) IMPLANT
DRAPE C-ARM 42X72 X-RAY (DRAPES) ×2 IMPLANT
DRAPE LAPAROTOMY 100X72 PEDS (DRAPES) ×1 IMPLANT
DRAPE MICROSCOPE SLANT 54X150 (MISCELLANEOUS) ×1 IMPLANT
DRAPE SHEET LG 3/4 BI-LAMINATE (DRAPES) ×1 IMPLANT
DRESSING MEPILEX FLEX 4X4 (GAUZE/BANDAGES/DRESSINGS) IMPLANT
DRILL NEURO 2X3.1 SOFT TOUCH (MISCELLANEOUS) ×1
DRSG MEPILEX FLEX 4X4 (GAUZE/BANDAGES/DRESSINGS)
DRSG OPSITE 4X5.5 SM (GAUZE/BANDAGES/DRESSINGS) ×2 IMPLANT
DRSG OPSITE POSTOP 3X4 (GAUZE/BANDAGES/DRESSINGS) ×1 IMPLANT
DRSG OPSITE POSTOP 4X6 (GAUZE/BANDAGES/DRESSINGS) IMPLANT
DURAPREP 26ML APPLICATOR (WOUND CARE) ×1 IMPLANT
ELECT COATED BLADE 2.86 ST (ELECTRODE) ×1 IMPLANT
ELECT REM PT RETURN 9FT ADLT (ELECTROSURGICAL) ×1
ELECTRODE REM PT RTRN 9FT ADLT (ELECTROSURGICAL) ×1 IMPLANT
ENDOSKELETON IMPLANT SM 7MM (Cage) ×1 IMPLANT
GAUZE 4X4 16PLY ~~LOC~~+RFID DBL (SPONGE) IMPLANT
GLOVE BIOGEL PI IND STRL 7.5 (GLOVE) ×1 IMPLANT
GLOVE ECLIPSE 7.5 STRL STRAW (GLOVE) ×1 IMPLANT
GLOVE SURG ENC MOIS LTX SZ8 (GLOVE) ×1 IMPLANT
GOWN STRL REUS W/ TWL LRG LVL3 (GOWN DISPOSABLE) ×2 IMPLANT
GOWN STRL REUS W/ TWL XL LVL3 (GOWN DISPOSABLE) ×2 IMPLANT
GOWN STRL REUS W/TWL 2XL LVL3 (GOWN DISPOSABLE) IMPLANT
GOWN STRL REUS W/TWL LRG LVL3 (GOWN DISPOSABLE) ×2
GOWN STRL REUS W/TWL XL LVL3 (GOWN DISPOSABLE) ×2
HEMOSTAT POWDER KIT SURGIFOAM (HEMOSTASIS) ×1 IMPLANT
KIT BASIN OR (CUSTOM PROCEDURE TRAY) ×1 IMPLANT
KIT TURNOVER KIT B (KITS) ×1 IMPLANT
NDL SPNL 22GX3.5 QUINCKE BK (NEEDLE) ×1 IMPLANT
NEEDLE HYPO 22GX1.5 SAFETY (NEEDLE) ×1 IMPLANT
NEEDLE SPNL 22GX3.5 QUINCKE BK (NEEDLE) ×1 IMPLANT
NS IRRIG 1000ML POUR BTL (IV SOLUTION) ×1 IMPLANT
PACK LAMINECTOMY NEURO (CUSTOM PROCEDURE TRAY) ×1 IMPLANT
PAD ARMBOARD 7.5X6 YLW CONV (MISCELLANEOUS) ×3 IMPLANT
PATTIES SURGICAL .5 X3 (DISPOSABLE) ×1 IMPLANT
PIN DISTRACTION 14MM (PIN) ×2 IMPLANT
PLATE ZEVO 1LVL 17MM (Plate) IMPLANT
PUTTY DBF 1CC CORTICAL FIBERS (Putty) IMPLANT
RASP 3.0MM (RASP) IMPLANT
SCREW 3.5 SELFDRILL 15MM VARI (Screw) IMPLANT
SCREW VA SD 3.5X16 (Screw) IMPLANT
SPIKE FLUID TRANSFER (MISCELLANEOUS) ×1 IMPLANT
SPONGE INTESTINAL PEANUT (DISPOSABLE) ×1 IMPLANT
STAPLER VISISTAT 35W (STAPLE) IMPLANT
STRIP CLOSURE SKIN 1/2X4 (GAUZE/BANDAGES/DRESSINGS) ×1 IMPLANT
SUT MNCRL AB 4-0 PS2 18 (SUTURE) ×1 IMPLANT
SUT SILK 2 0 TIES 10X30 (SUTURE) IMPLANT
SUT VIC AB 3-0 SH 8-18 (SUTURE) ×1 IMPLANT
TAPE CLOTH 3X10 TAN LF (GAUZE/BANDAGES/DRESSINGS) ×1 IMPLANT
TOWEL GREEN STERILE (TOWEL DISPOSABLE) ×1 IMPLANT
TOWEL GREEN STERILE FF (TOWEL DISPOSABLE) ×1 IMPLANT
WATER STERILE IRR 1000ML POUR (IV SOLUTION) ×1 IMPLANT

## 2022-03-17 NOTE — H&P (Signed)
CC: neck pain, weakness  HPI:     Patient is a 67 y.o. female presents with cervical myeloradiculopathy.  She had a history of C4-7 ACDF, and was found to have severe adjacent segment disease at C3-4.  She is here for elective surgery.     Patient Active Problem List   Diagnosis Date Noted   Degenerative disc disease, lumbar 11/25/2021   Left foot pain 08/24/2021   Right flank pain 04/15/2021   High cholesterol 11/02/2020   Osteopenia 09/01/2020   Depression 09/01/2020   Colon polyps 09/01/2020   Anxiety 09/01/2020   Trigger ring finger of right hand 07/07/2020   Chronic neck pain 11/07/2017   Bilateral carotid artery stenosis 05/11/2017   Insomnia due to other mental disorder 04/19/2016   Chronic bilateral low back pain without sciatica 09/14/2015   Prediabetes 04/02/2015   Hyperlipidemia LDL goal <130 04/02/2015   Gastroesophageal reflux disease without esophagitis 04/02/2015   Intrinsic eczema 04/02/2015   Recurrent major depressive disorder, in full remission ([place]) 04/02/2015   Elevated glucose 01/30/2015   Elevated ALT measurement 01/30/2015   Carpal tunnel syndrome of right wrist 01/30/2015   Bilateral leg edema 01/30/2015   Status post cervical spinal arthrodesis 05/22/2011   Primary osteoarthritis involving multiple joints 12/01/2006   Dyslipidemia 05/12/2006   COLONIC POLYPS, BENIGN, HX OF 05/12/2006   Past Medical History:  Diagnosis Date   Anxiety    Colon polyps    Depression    GERD (gastroesophageal reflux disease)    High cholesterol    Hypertension    Osteopenia    PONV (postoperative nausea and vomiting)    Pre-diabetes     Past Surgical History:  Procedure Laterality Date   BREAST LUMPECTOMY Left    CERVICAL SPINE SURGERY      2013 has 8 screws    CESAREAN SECTION     COLONOSCOPY  08/26/2009   Normal colonoscopy up to cecum    LEFT HEART CATHETERIZATION WITH CORONARY ANGIOGRAM N/A 06/07/2012   Procedure: LEFT HEART CATHETERIZATION WITH  CORONARY ANGIOGRAM;  Surgeon: Laverda Page, MD;  Location: [facility] CATH LAB;  Service: Cardiovascular;  Laterality: N/A;    Medications Prior to Admission  Medication Sig Dispense Refill Last Dose   acetaminophen (TYLENOL) 500 MG tablet Take 500 mg by mouth every 6 (six) hours as needed.   Past Week   acetaminophen (TYLENOL) 650 MG CR tablet Take 650 mg by mouth every 8 (eight) hours as needed for pain.   Past Week   aspirin EC 81 MG tablet Take 81 mg by mouth daily.   Past Week   azelastine (OPTIVAR) 0.05 % ophthalmic solution Place 1 drop into both eyes daily.   Past Week   Cholecalciferol (VITAMIN D-3 PO) Take 1 capsule by mouth daily.   Past Week   [employer] COENZYME Q10 PO Take 100 mg by mouth daily.   Past Week   ezetimibe (ZETIA) 10 MG tablet Take 1 tablet (10 mg total) by mouth daily. 30 tablet 1    gabapentin (NEURONTIN) 100 MG capsule Take 1 capsule (100 mg total) by mouth at bedtime. (Patient taking differently: Take 100 mg by mouth daily as needed (For pain).) 90 capsule 0 Past Month   Ginger, Zingiber officinalis, (GINGER PO) Take 1 capsule by mouth daily.   Past Week   loratadine (CLARITIN) 10 MG tablet Take 10 mg by mouth daily.   Past Week   Multiple Vitamins-Minerals (CENTRUM SILVER 50+WOMEN) TABS Take 1 tablet by  mouth daily. Unknown strength   Past Week   mupirocin ointment (BACTROBAN) 2 % Sig to affected area twice a day x 7 days. 22 g 1 Past Week   rosuvastatin (CRESTOR) 40 MG tablet Take 1 tablet (40 mg total) by mouth daily. 90 tablet 3 03/16/2022   triamcinolone (NASACORT) 55 MCG/ACT AERO nasal inhaler Place 2 sprays into the nose daily. 1 Inhaler 12 Past Week   VITAMIN E PO Take 1 tablet by mouth daily.   Past Week   Allergies  Allergen Reactions   Lactose Intolerance (Gi)    Other     Dish/laundry dish detergent and bleach-smell bothers patient and dust   Bupropion Nausea Only   Niaspan [Niacin] Rash    Itching    Social History   Tobacco Use   Smoking status: Never    Smokeless tobacco: Never  Substance Use Topics   Alcohol use: Not Currently    Family History  Problem Relation Age of Onset   Heart disease Mother    Heart disease Father    Colon polyps Father    Kidney disease Maternal Grandmother    Stomach cancer Paternal Grandmother    Diabetes Paternal Grandmother    Esophageal cancer Neg Hx    Colon cancer Neg Hx    Rectal cancer Neg Hx      Review of Systems Pertinent items noted in HPI and remainder of comprehensive ROS otherwise negative.  Objective:   Patient Vitals for the past 8 hrs:  BP Temp Temp src Pulse Resp SpO2 Height Weight  03/17/22 0632 132/63 98.2 F (36.8 C) Oral (!) 55 16 97 % '5\' 2"'$  (1.575 m) 76 kg   No intake/output data recorded. No intake/output data recorded.      General : Alert, cooperative, no distress, appears stated age   Head:  Normocephalic/atraumatic    Eyes: PERRL, conjunctiva/corneas clear, EOM's intact. Fundi could not be visualized Neck: Supple Chest:  Respirations unlabored Chest wall: no tenderness or deformity Heart: Regular rate and rhythm Abdomen: Soft, nontender and nondistended Extremities: warm and well-perfused Skin: normal turgor, color and texture Neurologic:  Alert, oriented x 3.  Eyes open spontaneously. PERRL, EOMI, VFC, no facial droop. V1-3 intact.  No dysarthria, tongue protrusion symmetric.  CNII-XII intact. Normal strength, sensation and reflexes throughout.  No pronator drift, full strength in legs       Data ReviewCBC:  Lab Results  Component Value Date   WBC 6.6 03/11/2022   RBC 4.90 03/11/2022   BMP:  Lab Results  Component Value Date   GLUCOSE 108 (H) 03/11/2022   CO2 29 03/11/2022   BUN 9 03/11/2022   BUN 12 03/18/2020   CREATININE 0.76 03/11/2022   CALCIUM 9.7 03/11/2022   Radiology review:  See clinic note  Assessment:   Active Problems:   * No active hospital problems. *  Hx of C4-7 ACDF, adjacent segment disease at C3-4 Plan:   - plan  for C3-4 ACDF, possible removal of plate

## 2022-03-17 NOTE — Transfer of Care (Signed)
Immediate Anesthesia Transfer of Care Note  Patient: Christine Hawkins  Procedure(s) Performed: Anterior Cervical Decompression Fusion Cervical three-four, removal CERVICAL PLATE (Neck)  Patient Location: PACU  Anesthesia Type:General  Level of Consciousness: drowsy and patient cooperative  Airway & Oxygen Therapy: Patient Spontanous Breathing and Patient connected to face mask oxygen  Post-op Assessment: Report given to RN, Post -op Vital signs reviewed and stable, and Patient moving all extremities  Post vital signs: Reviewed and stable  Last Vitals:  Vitals Value Taken Time  BP 137/66 03/17/22 1051  Temp    Pulse 81 03/17/22 1052  Resp 18 03/17/22 1052  SpO2 99 % 03/17/22 1052  Vitals shown include unvalidated device data.  Last Pain:  Vitals:   03/17/22 1683  TempSrc: Oral  PainSc: 3       Patients Stated Pain Goal: 3 (72/90/21 1155)  Complications: No notable events documented.

## 2022-03-17 NOTE — Anesthesia Postprocedure Evaluation (Signed)
Anesthesia Post Note  Patient: Christine Hawkins  Procedure(s) Performed: Anterior Cervical Decompression Fusion Cervical three-four, removal CERVICAL PLATE (Neck)     Patient location during evaluation: PACU Anesthesia Type: General Level of consciousness: awake and alert Pain management: pain level controlled Vital Signs Assessment: post-procedure vital signs reviewed and stable Respiratory status: spontaneous breathing, nonlabored ventilation, respiratory function stable and patient connected to nasal cannula oxygen Cardiovascular status: blood pressure returned to baseline and stable Postop Assessment: no apparent nausea or vomiting Anesthetic complications: no  No notable events documented.  Last Vitals:  Vitals:   03/17/22 1145 03/17/22 1217  BP: (!) 118/51 (!) 115/56  Pulse: 86 78  Resp: 12 14  Temp: 36.7 C 36.4 C  SpO2: 91% 94%    Last Pain:  Vitals:   03/17/22 1410  TempSrc:   PainSc: 6                  Purl Claytor S

## 2022-03-17 NOTE — Discharge Instructions (Addendum)
Resume aspirin 2/11 Can remove transparent dressing and shower 48 hours after surgery Steri-strips will fall off on their own Walk as much as possible No heavy lifting >10 lbs No excessive bending/twisting at the neck

## 2022-03-17 NOTE — Progress Notes (Signed)
Orthopedic Tech Progress Note Patient Details:  Christine Hawkins 24-Jan-1956 [id number]  Soft cervical collar applied in PACU. Translator at bedside communicated to pt it is to be worn at all times.  Ortho Devices Type of Ortho Device: Soft collar Ortho Device/Splint Location: neck Ortho Device/Splint Interventions: Ordered, Application   Post Interventions Patient Tolerated: Well Instructions Provided: Care of device, Adjustment of device  Christine Hawkins 03/17/2022, 12:38 PM

## 2022-03-17 NOTE — Anesthesia Procedure Notes (Signed)
Procedure Name: Intubation Date/Time: 03/17/2022 8:12 AM  Performed by: Elvin So, CRNAPre-anesthesia Checklist: Patient identified, Emergency Drugs available, Suction available and Patient being monitored Patient Re-evaluated:Patient Re-evaluated prior to induction Oxygen Delivery Method: Circle System Utilized Preoxygenation: Pre-oxygenation with 100% oxygen Induction Type: IV induction Ventilation: Mask ventilation without difficulty Laryngoscope Size: Glidescope and 3 Grade View: Grade I Tube type: Oral Tube size: 7.0 mm Number of attempts: 1 Airway Equipment and Method: Stylet and Oral airway Placement Confirmation: ETT inserted through vocal cords under direct vision, positive ETCO2 and breath sounds checked- equal and bilateral Secured at: 22 cm Tube secured with: Tape Dental Injury: Teeth and Oropharynx as per pre-operative assessment

## 2022-03-17 NOTE — Op Note (Signed)
PREOP DIAGNOSIS: C3-4 stenosis, adjacent segment disease  POSTOP DIAGNOSIS: C3-4 stenosis, adjacent segment disease   PROCEDURE: 1. Arthrodesis C3-4, anterior interbody technique, including Discectomy for decompression of spinal cord and exiting nerve roots with foraminotomies  2. Partial removal of anterior cervical plate C4-5 2. Placement of intervertebral biomechanical device C3-4 3. Placement of anterior instrumentation consisting of interbody plate and screws K2-4 4. Use of morselized bone allograft  5. Use of intraoperative microscope  SURGEON: Dr. Duffy Rhody, MD  ASSISTANT: Ashok Pall, MD.  Please note, no qualified trainees were available to assist with the procedure.  Assistance was required for retraction of the visceral structures to safely allow for instrumentation.  ANESTHESIA: General Endotracheal  EBL: 25 ml  IMPLANTS: Medtronic 7 mm Titan-C interbody 17 mm Zevo plate 16 OX/73 mm screws  SPECIMENS: None  DRAINS: None  COMPLICATIONS: None immediate  CONDITION: Hemodynamically stable to PACU  HISTORY: Christine Hawkins is a 67 y.o. y.o. female with hx of C4-7 ACDF who developed adjacent segment disease with myeloradiculopathy at C3-4.  Nonsurgical treatments failed to improve her symptoms.  I discussed surgical treatment with the patient.  Risks, benefits, alternatives, and expected convalescence were discussed with the patient.  Risks discussed included but were not limited to bleeding, pain, infection, dysphagia, dysphonia, pseudoarthrosis, hardware failure, adjacent segment disease, CSF leak, neurologic deficits, weakness, numbness, paralysis, coma, and death. After all questions were answered, informed consent was obtained.  PROCEDURE IN DETAIL: The patient was brought to the operating room and transferred to the operative table. After induction of general anesthesia, the patient was positioned on the operative table in the supine position with all  pressure points meticulously padded. The skin of the neck was then prepped and draped in the usual sterile fashion.  After timeout was conducted, the skin was infiltrated with local anesthetic. Left anterior skin incision was then made sharply and Bovie electrocautery was used to dissect the subcutaneous tissue until the platysma was identified. The platysma was then divided and undermined. The sternocleidomastoid muscle was then identified and, utilizing natural fascial planes in the neck, the prevertebral fascia was identified and the carotid sheath was retracted laterally and the trachea and esophagus retracted medially. Deep cervical fascial scar was dissected and the upper aspect of the previous plate and the Z3-2 disc space was identified. Bovie electrocautery was used to dissect in the subperiosteal plane and elevate the bilateral longus coli muscles. Self-retaining retractors were then placed.  The previous plate was cut at the C4-5 disc level, and previous screws removed.  Plate was embedded in the body but able to be removed fairly easily. Caspar distraction pins were placed in the C3 and C4 adjacent bodies to allow for gentle distraction.  At this point, the microscope was draped and brought into the field, and the remainder of the case was done under the microscope using microdissecting technique.  The disc space was incised sharply and combination of high speed drill, curettes, and rongeurs were use to initially complete a discectomy. The high-speed drill was then used to complete discectomy until the posterior annulus was identified and removed and the posterior longitudinal ligament was identified. Using a nerve hook, the PLL was elevated, and Kerrison rongeurs were used to remove the posterior longitudinal ligament and the ventral thecal sac was identified. Using a combination of curettes and rongeurs, complete decompression of the thecal sac and exiting nerve roots at this level was completed,  and verified with easy passage of micro-nerve hook centrally  and in the bilateral foramina.  Having completed our decompression, attention was turned to placement of the intervertebral device. Trial spacers were used to select a size 7 mm graft. This graft was then filled with morcellized allograft, and inserted under live fluoroscopy.  After placement of the intervertebral devices, the caspar pins were removed.  An anterior cervical plate was placed across the interspaces for anterior fixation.  Using a high-speed drill, the cortex of the cervical vertebral bodies was punctured, and screws inserted in the vertebral bodies. Final fluoroscopic images in AP and lateral projections were taken to confirm good hardware placement.  At this point, after all counts were verified to be correct, meticulous hemostasis was secured using a combination of bipolar electrocautery and passive hemostatics. The platysma muscle was then closed using interrupted 3-0 Vicryl sutures, and the skin was closed with a 4-0 monocryl in subcutical fashion. Sterile dressings were then applied and the drapes removed.  The patient tolerated the procedure well and was extubated in the room and taken to the postanesthesia care unit in stable condition.  All counts were correct at the end of the procedure.

## 2022-03-18 NOTE — Progress Notes (Signed)
Patient alert and oriented, mae's well, voiding adequate amount of urine, swallowing without difficulty, no c/o pain at time of discharge. Patient discharged home with family. Script and discharged instructions given to patient. Patient and family stated understanding of instructions given. Patient has an appointment with Dr. Thomas ?

## 2022-03-18 NOTE — Evaluation (Signed)
Occupational Therapy Evaluation Patient Details Name: Christine Hawkins MRN: [id number] DOB: [birth year] Today's Date: 03/18/2022   History of Present Illness 67 yo female s/p ACDF #-4 removal of cervical plate PMH ACDF [id number], DDD, depression, anxiety, HLD, eczema, HTN,   Clinical Impression   Patient evaluated by Occupational Therapy with no further acute OT needs identified. All education has been completed and the patient has no further questions. See below for any follow-up Occupational Therapy or equipment needs. OT to sign off. Thank you for referral.        Recommendations for follow up therapy are one component of a multi-disciplinary discharge planning process, led by the attending physician.  Recommendations may be updated based on patient status, additional functional criteria and insurance authorization.   Follow Up Recommendations  No OT follow up     Assistance Recommended at Discharge PRN  Patient can return home with the following A little help with bathing/dressing/bathroom;Assist for transportation    Functional Status Assessment  Patient has had a recent decline in their functional status and demonstrates the ability to make significant improvements in function in a reasonable and predictable amount of time.  Equipment Recommendations  None recommended by OT    Recommendations for Other Services       Precautions / Restrictions Precautions Precautions: Cervical Precaution Comments: cervical handout provided in [place] and son confirms english is fine Required Braces or Orthoses: Cervical Brace Cervical Brace: At all times Restrictions Weight Bearing Restrictions: No      Mobility Bed Mobility Overal bed mobility: Needs Assistance Bed Mobility: Rolling, Supine to Sit, Sit to Supine Rolling: Supervision   Supine to sit: Supervision Sit to supine: Supervision   General bed mobility comments: hob reducted to 2 pillow height. pt with good return demo fo  all precautions    Transfers Overall transfer level: Needs assistance Equipment used: Rolling walker (2 wheels) Transfers: Sit to/from Stand Sit to Stand: Min guard                  Balance Overall balance assessment: Mild deficits observed, not formally tested                                         ADL either performed or assessed with clinical judgement   ADL Overall ADL's : Needs assistance/impaired Eating/Feeding: Set up   Grooming: Set up   Upper Body Bathing: Set up   Lower Body Bathing: Minimal assistance   Upper Body Dressing : Minimal assistance   Lower Body Dressing: Minimal assistance Lower Body Dressing Details (indicate cue type and reason): son (A) with dressing this date and requires threading over feet and up to knees Toilet Transfer: Min guard;Rolling walker (2 wheels)           Functional mobility during ADLs: Min guard;Rolling walker (2 wheels) General ADL Comments: educated on son setting up environment for success with precautions.   Cervical precautions ( handout provided): Educated patient on don doff brace with return demonstration at mirror in bathroom,  educated on oral care using cups, washing face with cloth, never to wash directly on incision site, avoid neck rotation flexion and extension, positioning with pillows in chair for bil UE, sleeping positioning, avoiding pushing / pulling with bil UE,   Pt educated on need to notify doctor / RN of swallowing changes or choking.. son present for all education. Handout  has rehab dept number if they have any questions   Vision Baseline Vision/History: 0 No visual deficits Ability to See in Adequate Light: 0 Adequate Patient Visual Report: No change from baseline       Perception     Praxis      Pertinent Vitals/Pain Pain Assessment Pain Assessment: Faces Faces Pain Scale: Hurts a little bit Pain Location: neck Pain Descriptors / Indicators: Sore Pain  Intervention(s): Limited activity within patient's tolerance, Monitored during session, Repositioned     Hand Dominance Right   Extremity/Trunk Assessment Upper Extremity Assessment Upper Extremity Assessment: Overall WFL for tasks assessed (reports sensory changes have resolved and not numb any more)   Lower Extremity Assessment Lower Extremity Assessment: Generalized weakness   Cervical / Trunk Assessment Cervical / Trunk Assessment: Neck Surgery   Communication Communication Communication: Prefers language other than English (spanish/ son translating and pt agreeable)   Cognition Arousal/Alertness: Awake/alert Behavior During Therapy: WFL for tasks assessed/performed Overall Cognitive Status: Within Functional Limits for tasks assessed                                 General Comments: does report some fatigue and poor rest over the night     General Comments  pt with decreased gait velocity but verbalized feeling fatigued with recent medications. pt denies any dizziness. pt closing eyes at times during session but verbalizes to therapist. son present and also repeating all education to show teach back for care of patient at home    Exercises     Shoulder [place] expects to be discharged to:: Private residence Living Arrangements: Spouse/significant other;Other (Comment) (son staying with her through recovery) Available Help at Discharge: Family;Available PRN/intermittently Type of Home: House Home Access: Ramped entrance     Home Layout: One level     Bathroom Shower/Tub: Occupational psychologist: Handicapped height Bathroom Accessibility: No (leaves RW at the door)   Home Equipment: Public relations account executive (2 wheels)   Additional Comments: lives with spouse that has (A) for all care due to CVA. 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 Son will give patient (A) but will leave for 4-5 hours per day to  visit job site.      Prior Functioning/Environment Prior Level of Function : Needs assist             Mobility Comments: walking with RW ADLs Comments: needs (A) for LB dressing recently. son plans to help with bathing and dressing upon d/c        OT Problem List:        OT Treatment/Interventions:      OT Goals(Current goals can be found in the care plan section) Acute Rehab OT Goals Patient Stated Goal: to go home today/ son wants mother to not overly help her spouse Potential to Achieve Goals: Good  OT Frequency:      Co-evaluation              AM-PAC OT "6 Clicks" Daily Activity     Outcome Measure Help from another person eating meals?: None Help from another person taking care of personal grooming?: None Help from another person toileting, which includes using toliet, bedpan, or urinal?: A Little Help from another person bathing (including washing, rinsing, drying)?: A Little Help from another person to put on and taking off regular upper body clothing?: None  Help from another person to put on and taking off regular lower body clothing?: A Little 6 Click Score: 21   End of Session Equipment Utilized During Treatment: Rolling walker (2 wheels) Nurse Communication: Mobility status;Precautions  Activity Tolerance: Patient tolerated treatment well Patient left: in bed;with call bell/phone within reach;with family/visitor present  OT Visit Diagnosis: Unsteadiness on feet (R26.81)                Time: 0931-1000 OT Time Calculation (min): 29 min Charges:  OT General Charges $OT Visit: 1 Visit OT Evaluation $OT Eval Moderate Complexity: 1 Mod OT Treatments $Self Care/Home Management : 8-22 mins   Brynn, OTR/L  Acute Rehabilitation Services Office: [phone] .   Jeri Modena 03/18/2022, 11:41 AM

## 2022-03-18 NOTE — Progress Notes (Signed)
Physical Therapy Note  Spoke with occupational therapy after their initial evaluation. OT reports patient is functioning at a high level of independence and no physical therapy is indicated at this time. PT is signing-off. Please re-order if there is any significant change in status. Thank you for this referral.  Candie Mile, PT, DPT Physical Therapist Acute Rehabilitation Services [facility]

## 2022-03-18 NOTE — Plan of Care (Signed)

## 2022-03-21 ENCOUNTER — Telehealth: Payer: Self-pay

## 2022-03-21 NOTE — Transitions of Care (Post Inpatient/ED Visit) (Signed)
   03/21/2022  Name: Christine Hawkins MRN: [id number] DOB: 20-Mar-1955  Today's TOC FU Call Status: Today's TOC FU Call Status:: Successful TOC FU Call Competed TOC FU Call Complete Date: 03/21/22  Transition Care Management Follow-up Telephone Call Date of Discharge: 03/18/22 Discharge Facility: [facility] Type of Discharge: Inpatient Admission Primary Inpatient Discharge Diagnosis:: Stenosis of cervical spine with myelopathy (Stenosis of cervical spine with myelopathy) How have you been since you were released from the hospital?: Better Any questions or concerns?: No  Items Reviewed: Did you receive and understand the discharge instructions provided?: Yes Medications obtained and verified?: Yes (Medications Reviewed) Any new allergies since your discharge?: No Dietary orders reviewed?: Yes Do you have support at home?: Yes People in Home: child(ren), adult  Home Care and Equipment/Supplies: [place] Ordered?: NA  Functional Questionnaire: Do you need assistance with bathing/showering or dressing?: No Do you need assistance with meal preparation?: No Do you need assistance with eating?: No Do you have difficulty maintaining continence: No Do you need assistance with getting out of bed/getting out of a chair/moving?: No Do you have difficulty managing or taking your medications?: No  Folllow up appointments reviewed: PCP Follow-up appointment confirmed?: NA Specialist Hospital Follow-up appointment confirmed?: Yes Date of Specialist follow-up appointment?: 04/01/22 Follow-Up Specialty Provider:: Dr Marcello Moores Do you need transportation to your follow-up appointment?: No Do you understand care options if your condition(s) worsen?: Yes-patient verbalized understanding    [place] LPN [place] Direct Dial [phone]

## 2022-03-22 ENCOUNTER — Encounter (HOSPITAL_COMMUNITY): Payer: Self-pay | Admitting: Neurosurgery

## 2022-04-20 NOTE — Discharge Summary (Signed)
Physician Discharge Summary  Patient ID: Christine Hawkins MRN: [id number] DOB/AGE: 1955-03-05 67 y.o.  Admit date: 03/17/2022 Discharge date: 03/17/22  Admission Diagnoses:  Cervical stenosis with myelopathy  Discharge Diagnoses:  Same Principal Problem:   Stenosis of cervical spine with myelopathy [facility]) Active Problems:   S/P cervical spinal fusion   Discharged Condition: Stable  Hospital Course:  Christine Hawkins is a 67 y.o. female with hx of C4-5 ACDF who underwent C3-4 ACDF for severe cervical stenosis with myelopathy.  She was monitored in the PACU postoperatively and was doing well, tolerating a regular diet with pain well controlled.  Her neurologic exam was unchanged from preoperatively.  She was discharged home.  Treatments: Surgery - C3-4 ACDF, removal of plate C4-5  Discharge Exam: Blood pressure (!) 119/54, pulse 65, temperature 98.4 F (36.9 C), temperature source Oral, resp. rate 18, height '5\' 2"'$  (1.575 m), weight 76 kg, SpO2 99 %. Awake, alert, oriented Speech fluent, appropriate CN grossly intact 5/5 BUE/BLE Wound c/d/i  Disposition: Discharge disposition: 01-Home or Self Care       Discharge Instructions     Incentive spirometry RT   Complete by: As directed       Allergies as of 03/18/2022       Reactions   Lactose Intolerance (gi)    Other    Dish/laundry dish detergent and bleach-smell bothers patient and dust   Bupropion Nausea Only   Niaspan [niacin] Rash   Itching        Medication List     STOP taking these medications    aspirin EC 81 MG tablet       TAKE these medications    acetaminophen 500 MG tablet Commonly known as: TYLENOL Take 500 mg by mouth every 6 (six) hours as needed.   acetaminophen 650 MG CR tablet Commonly known as: TYLENOL Take 650 mg by mouth every 8 (eight) hours as needed for pain.   azelastine 0.05 % ophthalmic solution Commonly known as: OPTIVAR Place 1 drop into both eyes daily.    Centrum Silver 50+Women Tabs Take 1 tablet by mouth daily. Unknown strength   [employer] COENZYME Q10 PO Take 100 mg by mouth daily.   docusate sodium 100 MG capsule Commonly known as: Colace Take 1 capsule (100 mg total) by mouth 2 (two) times daily.   ezetimibe 10 MG tablet Commonly known as: ZETIA Take 1 tablet (10 mg total) by mouth daily.   gabapentin 100 MG capsule Commonly known as: NEURONTIN Take 1 capsule (100 mg total) by mouth at bedtime. What changed:  when to take this reasons to take this   GINGER PO Take 1 capsule by mouth daily.   loratadine 10 MG tablet Commonly known as: CLARITIN Take 10 mg by mouth daily.   mupirocin ointment 2 % Commonly known as: Bactroban Sig to affected area twice a day x 7 days.   oxyCODONE-acetaminophen 5-325 MG tablet Commonly known as: Percocet Take 1 tablet by mouth every 6 (six) hours as needed for severe pain.   rosuvastatin 40 MG tablet Commonly known as: CRESTOR Take 1 tablet (40 mg total) by mouth daily.   triamcinolone 55 MCG/ACT Aero nasal inhaler Commonly known as: NASACORT Place 2 sprays into the nose daily.   VITAMIN D-3 PO Take 1 capsule by mouth daily.   VITAMIN E PO Take 1 tablet by mouth daily.        Follow-up Information     Vallarie Mare, MD Follow up  in 2 week(s).   Specialty: Neurosurgery Contact information: [address] [place] [address] [phone]                 Signed: Vallarie Mare 04/20/2022, 3:34 PM

## 2022-05-04 DIAGNOSIS — M4322 Fusion of spine, cervical region: Secondary | ICD-10-CM | POA: Diagnosis not present

## 2022-05-30 ENCOUNTER — Encounter: Payer: Self-pay | Admitting: Emergency Medicine

## 2022-05-30 ENCOUNTER — Ambulatory Visit (INDEPENDENT_AMBULATORY_CARE_PROVIDER_SITE_OTHER): Payer: 59 | Admitting: Emergency Medicine

## 2022-05-30 VITALS — BP 126/80 | HR 60 | Temp 98.4°F | Ht 62.0 in | Wt 161.1 lb

## 2022-05-30 DIAGNOSIS — M159 Polyosteoarthritis, unspecified: Secondary | ICD-10-CM

## 2022-05-30 DIAGNOSIS — R7303 Prediabetes: Secondary | ICD-10-CM

## 2022-05-30 DIAGNOSIS — G8929 Other chronic pain: Secondary | ICD-10-CM | POA: Diagnosis not present

## 2022-05-30 DIAGNOSIS — E785 Hyperlipidemia, unspecified: Secondary | ICD-10-CM

## 2022-05-30 DIAGNOSIS — M542 Cervicalgia: Secondary | ICD-10-CM | POA: Diagnosis not present

## 2022-05-30 DIAGNOSIS — M5136 Other intervertebral disc degeneration, lumbar region: Secondary | ICD-10-CM | POA: Diagnosis not present

## 2022-05-30 DIAGNOSIS — K219 Gastro-esophageal reflux disease without esophagitis: Secondary | ICD-10-CM | POA: Diagnosis not present

## 2022-05-30 DIAGNOSIS — M545 Low back pain, unspecified: Secondary | ICD-10-CM | POA: Diagnosis not present

## 2022-05-30 DIAGNOSIS — F3342 Major depressive disorder, recurrent, in full remission: Secondary | ICD-10-CM

## 2022-05-30 LAB — CBC WITH DIFFERENTIAL/PLATELET
Basophils Absolute: 0 10*3/uL (ref 0.0–0.1)
Basophils Relative: 0.5 % (ref 0.0–3.0)
Eosinophils Absolute: 0.1 10*3/uL (ref 0.0–0.7)
Eosinophils Relative: 2.7 % (ref 0.0–5.0)
HCT: 40.1 % (ref 36.0–46.0)
Hemoglobin: 12.8 g/dL (ref 12.0–15.0)
Lymphocytes Relative: 39.9 % (ref 12.0–46.0)
Lymphs Abs: 2.2 10*3/uL (ref 0.7–4.0)
MCHC: 32.1 g/dL (ref 30.0–36.0)
MCV: 84.3 fl (ref 78.0–100.0)
Monocytes Absolute: 0.5 10*3/uL (ref 0.1–1.0)
Monocytes Relative: 9.9 % (ref 3.0–12.0)
Neutro Abs: 2.6 10*3/uL (ref 1.4–7.7)
Neutrophils Relative %: 47 % (ref 43.0–77.0)
Platelets: 172 10*3/uL (ref 150.0–400.0)
RBC: 4.75 Mil/uL (ref 3.87–5.11)
RDW: 13.9 % (ref 11.5–15.5)
WBC: 5.5 10*3/uL (ref 4.0–10.5)

## 2022-05-30 LAB — COMPREHENSIVE METABOLIC PANEL
ALT: 32 U/L (ref 0–35)
AST: 27 U/L (ref 0–37)
Albumin: 4.4 g/dL (ref 3.5–5.2)
Alkaline Phosphatase: 67 U/L (ref 39–117)
BUN: 13 mg/dL (ref 6–23)
CO2: 31 mEq/L (ref 19–32)
Calcium: 9.4 mg/dL (ref 8.4–10.5)
Chloride: 102 mEq/L (ref 96–112)
Creatinine, Ser: 0.85 mg/dL (ref 0.40–1.20)
GFR: 70.12 mL/min (ref >60.00)
Glucose, Bld: 89 mg/dL (ref 70–99)
Potassium: 4 mEq/L (ref 3.5–5.1)
Sodium: 138 mEq/L (ref 135–145)
Total Bilirubin: 0.4 mg/dL (ref 0.2–1.2)
Total Protein: 7 g/dL (ref 6.0–8.3)

## 2022-05-30 LAB — HEMOGLOBIN A1C: Hgb A1c MFr Bld: 6.4 % (ref 4.6–6.5)

## 2022-05-30 LAB — LIPID PANEL
Cholesterol: 178 mg/dL (ref 0–200)
HDL: 51.9 mg/dL (ref >39.00)
LDL Cholesterol: 104 mg/dL — ABNORMAL HIGH (ref 0–99)
NonHDL: 126.26
Total CHOL/HDL Ratio: 3
Triglycerides: 112 mg/dL (ref 0.0–149.0)
VLDL: 22.4 mg/dL (ref 0.0–40.0)

## 2022-05-30 MED ORDER — EZETIMIBE 10 MG PO TABS: 10.0000 mg | ORAL_TABLET | Freq: Every day | ORAL | 1 refills | Status: DC

## 2022-05-30 NOTE — Assessment & Plan Note (Signed)
Stable and asymptomatic.  No concerns.

## 2022-05-30 NOTE — Assessment & Plan Note (Signed)
Much improved after cervical fusion surgery Off opiates.  Takes Tylenol only as needed

## 2022-05-30 NOTE — Assessment & Plan Note (Signed)
Stable.  Diet and nutrition discussed. 

## 2022-05-30 NOTE — Assessment & Plan Note (Signed)
Stable. Cardiovascular risk associated with dyslipidemia discussed Diet and nutrition discussed Lipid profile done today Continues Zetia 10 mg and rosuvastatin 40 mg daily

## 2022-05-30 NOTE — Assessment & Plan Note (Signed)
Stable with occasional low back pain

## 2022-05-30 NOTE — Assessment & Plan Note (Signed)
Stable and asymptomatic.  No concerns. Not on any medication.

## 2022-05-30 NOTE — Progress Notes (Signed)
Christine Hawkins 67 y.o.   Chief Complaint  Patient presents with   Medical Management of Chronic Issues    f/u appt , patient states she had surgery and now she feels pain in her mid part of her back    Urinary Incontinence    Patient is having some incontinence issues     HISTORY OF PRESENT ILLNESS: This is a 67 y.o. female here for follow-up of chronic medical problems Had successful neck surgery with amelioration of symptoms Has occasional stress urinary incontinence problems No other complaints or medical concerns today.  HPI   Prior to Admission medications   Medication Sig Start Date End Date Taking? Authorizing Provider  acetaminophen (TYLENOL) 500 MG tablet Take 500 mg by mouth every 6 (six) hours as needed.   Yes [provider]  azelastine (OPTIVAR) 0.05 % ophthalmic solution Place 1 drop into both eyes daily. 01/27/20  Yes [provider]  Cholecalciferol (VITAMIN D-3 PO) Take 1 capsule by mouth daily.   Yes [provider]  CVS COENZYME Q10 PO Take 100 mg by mouth daily.   Yes [provider]  docusate sodium (COLACE) 100 MG capsule Take 1 capsule (100 mg total) by mouth 2 (two) times daily. 03/17/22 03/17/23 Yes Bedelia Person, MD  gabapentin (NEURONTIN) 100 MG capsule Take 1 capsule (100 mg total) by mouth at bedtime. Patient taking differently: Take 100 mg by mouth daily as needed (For pain). 10/19/21  Yes Vivi Barrack, DPM  Ginger, Zingiber officinalis, (GINGER PO) Take 1 capsule by mouth daily.   Yes [provider]  loratadine (CLARITIN) 10 MG tablet Take 10 mg by mouth daily.   Yes [provider]  Multiple Vitamins-Minerals (CENTRUM SILVER 50+WOMEN) TABS Take 1 tablet by mouth daily. Unknown strength   Yes [provider]  mupirocin ointment (BACTROBAN) 2 % Sig to affected area twice a day x 7 days. 03/18/19  Yes Geneva Grosser, Eilleen Kempf, MD  rosuvastatin (CRESTOR) 40 MG tablet Take 1 tablet  (40 mg total) by mouth daily. 08/24/21 08/19/22 Yes Kaithlyn Teagle, Eilleen Kempf, MD  triamcinolone (NASACORT) 55 MCG/ACT AERO nasal inhaler Place 2 sprays into the nose daily. 03/18/19  Yes Sissy Goetzke, Eilleen Kempf, MD  VITAMIN E PO Take 1 tablet by mouth daily.   Yes [provider]  acetaminophen (TYLENOL) 650 MG CR tablet Take 650 mg by mouth every 8 (eight) hours as needed for pain.    [provider]  ezetimibe (ZETIA) 10 MG tablet Take 1 tablet (10 mg total) by mouth daily. 08/20/21 03/11/22  Georgeanna Lea, MD  oxyCODONE-acetaminophen (PERCOCET) 5-325 MG tablet Take 1 tablet by mouth every 6 (six) hours as needed for severe pain. 03/17/22 03/17/23  Bedelia Person, MD    Allergies  Allergen Reactions   Lactose Intolerance (Gi)    Other     Dish/laundry dish detergent and bleach-smell bothers patient and dust   Bupropion Nausea Only   Niaspan [Niacin] Rash    Itching    Patient Active Problem List   Diagnosis Date Noted   Stenosis of cervical spine with myelopathy 03/17/2022   S/P cervical spinal fusion 03/17/2022   Degenerative disc disease, lumbar 11/25/2021   Osteopenia 09/01/2020   Depression 09/01/2020   Colon polyps 09/01/2020   Anxiety 09/01/2020   Trigger ring finger of right hand 07/07/2020   Chronic neck pain 11/07/2017   Bilateral carotid artery stenosis 05/11/2017   Insomnia due to other mental disorder 04/19/2016  Chronic bilateral low back pain without sciatica 09/14/2015   Prediabetes 04/02/2015   Hyperlipidemia LDL goal <130 04/02/2015   Gastroesophageal reflux disease without esophagitis 04/02/2015   Intrinsic eczema 04/02/2015   Recurrent major depressive disorder, in full remission 04/02/2015   Carpal tunnel syndrome of right wrist 01/30/2015   Bilateral leg edema 01/30/2015   Status post cervical spinal arthrodesis 05/22/2011   Primary osteoarthritis involving multiple joints 12/01/2006   Dyslipidemia 05/12/2006   COLONIC POLYPS, BENIGN, HX  OF 05/12/2006    Past Medical History:  Diagnosis Date   Anxiety    Colon polyps    Depression    GERD (gastroesophageal reflux disease)    High cholesterol    Hypertension    Osteopenia    PONV (postoperative nausea and vomiting)    Pre-diabetes     Past Surgical History:  Procedure Laterality Date   ANTERIOR CERVICAL DECOMP/DISCECTOMY FUSION N/A 03/17/2022   Procedure: Anterior Cervical Decompression Fusion Cervical three-four, removal CERVICAL PLATE;  Surgeon: Bedelia Person, MD;  Location: [facility];  Service: Neurosurgery;  Laterality: N/A;   BREAST LUMPECTOMY Left    CERVICAL SPINE SURGERY      2013 has 8 screws    CESAREAN SECTION     COLONOSCOPY  08/26/2009   Normal colonoscopy up to cecum    LEFT HEART CATHETERIZATION WITH CORONARY ANGIOGRAM N/A 06/07/2012   Procedure: LEFT HEART CATHETERIZATION WITH CORONARY ANGIOGRAM;  Surgeon: Pamella Pert, MD;  Location: [facility] CATH LAB;  Service: Cardiovascular;  Laterality: N/A;    Social History   Socioeconomic History   Marital status: Legally Separated    Spouse name: Not on file   Number of children: 2   Years of education: Not on file   Highest education level: Not on file  Occupational History   Occupation: retired  Tobacco Use   Smoking status: Never   Smokeless tobacco: Never  Vaping Use   Vaping Use: Never used  Substance and Sexual Activity   Alcohol use: Not Currently   Drug use: Not Currently   Sexual activity: Not Currently  Other Topics Concern   Not on file  Social History Narrative   ** Merged History Encounter **       Social Determinants of Health   Financial Resource Strain: Not on file  Food Insecurity: Not on file  Transportation Needs: Not on file  Physical Activity: Not on file  Stress: Not on file  Social Connections: Not on file  Intimate Partner Violence: Not on file    Family History  Problem Relation Age of Onset   Heart disease Mother    Heart disease Father    Colon polyps  Father    Kidney disease Maternal Grandmother    Stomach cancer Paternal Grandmother    Diabetes Paternal Grandmother    Esophageal cancer Neg Hx    Colon cancer Neg Hx    Rectal cancer Neg Hx      Review of Systems  Constitutional: Negative.  Negative for chills and fever.  HENT: Negative.  Negative for congestion and sore throat.   Respiratory: Negative.  Negative for cough and shortness of breath.   Cardiovascular: Negative.  Negative for chest pain and palpitations.  Gastrointestinal:  Negative for abdominal pain, nausea and vomiting.  Genitourinary:  Negative for dysuria, flank pain, frequency and hematuria.       Occasional stress incontinence  Skin: Negative.  Negative for rash.  Neurological: Negative.  Negative for dizziness and headaches.  All other systems reviewed and are negative.   Vitals:   05/30/22 1426  BP: 126/80  Pulse: 60  Temp: 98.4 F (36.9 C)  SpO2: 98%    Physical Exam Vitals reviewed.  Constitutional:      Appearance: Normal appearance.  HENT:     Head: Normocephalic.     Mouth/Throat:     Mouth: Mucous membranes are moist.     Pharynx: Oropharynx is clear.  Eyes:     Extraocular Movements: Extraocular movements intact.     Conjunctiva/sclera: Conjunctivae normal.     Pupils: Pupils are equal, round, and reactive to light.  Cardiovascular:     Rate and Rhythm: Normal rate and regular rhythm.     Pulses: Normal pulses.     Heart sounds: Normal heart sounds.  Pulmonary:     Effort: Pulmonary effort is normal.     Breath sounds: Normal breath sounds.  Abdominal:     Palpations: Abdomen is soft.     Tenderness: There is no abdominal tenderness.  Musculoskeletal:     Cervical back: No tenderness.     Right lower leg: No edema.     Left lower leg: No edema.  Lymphadenopathy:     Cervical: No cervical adenopathy.  Skin:    General: Skin is warm and dry.     Capillary Refill: Capillary refill takes less than 2 seconds.  Neurological:      General: No focal deficit present.     Mental Status: She is alert and oriented to person, place, and time.  Psychiatric:        Mood and Affect: Mood normal.        Behavior: Behavior normal.      ASSESSMENT & PLAN: A total of 46 minutes was spent with the patient and counseling/coordination of care regarding preparing for this visit, review of most recent office visit, review of multiple chronic medical conditions and their management, review of all medications, education on nutrition, pain management, review of health maintenance items, prognosis, documentation and need for follow-up.  Problem List Items Addressed This Visit       Digestive   Gastroesophageal reflux disease without esophagitis    Stable and asymptomatic.  No concerns.        Musculoskeletal and Integument   Primary osteoarthritis involving multiple joints    Stable.  Pain management discussed Tylenol helps No complications and no concerns today.      Degenerative disc disease, lumbar    Stable with occasional low back pain        Other   Dyslipidemia - Primary    Stable. Cardiovascular risk associated with dyslipidemia discussed Diet and nutrition discussed Lipid profile done today Continues Zetia 10 mg and rosuvastatin 40 mg daily      Relevant Medications   ezetimibe (ZETIA) 10 MG tablet   Other Relevant Orders   CBC with Differential/Platelet   Comprehensive metabolic panel   Hemoglobin A1c   Lipid panel   Chronic neck pain    Much improved after cervical fusion surgery Off opiates.  Takes Tylenol only as needed      Chronic bilateral low back pain without sciatica   Prediabetes    Stable.  Diet and nutrition discussed      Relevant Orders   Hemoglobin A1c   Recurrent major depressive disorder, in full remission    Stable and asymptomatic.  No concerns. Not on any medication.      Patient Instructions  Mantenimiento de  la salud despus de los 65 aos de edad Health  Maintenance After Age 19 Despus de los 65 aos de edad, corre un riesgo mayor de Film/video editor enfermedades e infecciones a Air cabin crew, como tambin de sufrir lesiones por cadas. Las cadas son la causa principal de las fracturas de huesos y lesiones en la cabeza de personas mayores de 65 aos de edad. Recibir cuidados preventivos de forma regular puede ayudarlo a mantenerse saludable y en buen [place]. Los cuidados preventivos incluyen realizarse anlisis de forma regular y Forensic psychologist en el estilo de vida segn las recomendaciones del mdico. Converse con el mdico sobre lo siguiente: Las pruebas de deteccin y los anlisis que debe International aid/development worker. Una prueba de deteccin es un estudio que se para Engineer, manufacturing la presencia de una enfermedad cuando no tiene sntomas. Un plan de dieta y ejercicios adecuado para usted. Qu debo saber sobre las pruebas de deteccin y los anlisis para prevenir cadas? Realizarse pruebas de deteccin y [employer] es la mejor manera de Engineer, manufacturing un problema de salud de forma temprana. El diagnstico y tratamiento tempranos le brindan la mejor oportunidad de Chief Operating Officer las afecciones mdicas que son comunes despus de los 65 aos de edad. Ciertas afecciones y elecciones de estilo de vida pueden hacer que sea ms propenso a sufrir Engineer, site. El mdico puede recomendarle lo siguiente: Controles regulares de la visin. Una visin deficiente y afecciones como las cataratas pueden hacer que sea ms propenso a sufrir Engineer, site. Si [place] lentes, asegrese de obtener una receta actualizada si su visin cambia. Revisin de medicamentos. Revise regularmente con el mdico todos los medicamentos que toma, incluidos los medicamentos de [place]. Consulte al [employer] efectos secundarios que pueden hacer que sea ms propenso a sufrir Engineer, site. Informe al mdico si alguno de los medicamentos que toma lo hace sentir mareado o somnoliento. Controles de fuerza y equilibrio. El mdico puede  recomendar ciertos estudios para controlar su fuerza y equilibrio al estar de pie, al caminar o al cambiar de posicin. Examen de los pies. El dolor y Materials engineer en los pies, como tambin no utilizar el calzado [place], pueden hacer que sea ms propenso a sufrir Engineer, site. Pruebas de deteccin, que incluyen las siguientes: Pruebas de deteccin para la osteoporosis. La osteoporosis es una afeccin que hace que los huesos se tornen ms dbiles y se quiebren con ms facilidad. Pruebas de deteccin para la presin arterial. Los cambios en la presin arterial y los medicamentos para Chief Operating Officer la presin arterial pueden hacerlo sentir mareado. Prueba de deteccin de la depresin. Es ms probable que sufra una cada si tiene miedo a caerse, se siente deprimido o se siente incapaz de Probation officer. Prueba de deteccin de consumo de alcohol. Beber demasiado alcohol puede afectar su equilibrio y puede hacer que sea ms propenso a sufrir Engineer, site. Siga estas indicaciones en su casa: Estilo de vida No beba alcohol si: Su mdico le indica no hacerlo. Si bebe alcohol: Limite la cantidad que bebe a lo siguiente: De 0 a 1 medida por da para las mujeres. De 0 a 2 medidas por da para los hombres. Sepa cunta cantidad de alcohol hay en las bebidas que toma. En los [address], una medida equivale a una botella de cerveza de 12 oz (355 ml), un vaso de vino de 5 oz (148 ml) o un vaso de una bebida alcohlica de alta graduacin de 1 oz (44 ml). No consuma ningn producto que contenga  nicotina o tabaco. Estos productos incluyen cigarrillos, tabaco para mascar y aparatos de vapeo, como los Administrator, Civil Service. Si necesita ayuda para dejar de consumir estos productos, consulte al [employer]. Actividad  Siga un programa de ejercicio regular para mantenerse en forma. Esto lo ayudar a Radio producer equilibrio. Consulte al mdico qu tipos de ejercicios son adecuados para usted. Si  necesita un bastn o un andador, selo segn las recomendaciones del mdico. Utilice calzado con buen apoyo y suela antideslizante. Seguridad  Retire los [employer] puedan causar tropiezos tales como alfombras, cables u obstculos. Instale equipos de seguridad, como barras para sostn en los baos y barandas de seguridad en las escaleras. Mantenga las habitaciones y los pasillos bien iluminados. Indicaciones generales Hable con el mdico sobre sus riesgos de sufrir una cada. Infrmele a su mdico si: Se cae. Asegrese de informarle a su mdico acerca de todas las cadas, incluso aquellas que parecen ser [employer]. Se siente mareado, cansado (tiene fatiga) o siente que pierde el equilibrio. Use los medicamentos de venta libre y los recetados solamente como se lo haya indicado el mdico. Estos incluyen suplementos. Siga una dieta sana y [place] un peso saludable. Una dieta saludable incluye productos lcteos descremados, carnes bajas en contenido de grasa ([place]), fibra de granos enteros, frijoles y [place] frutas y verduras. Mantngase al da con las vacunas. Realcese los estudios de rutina de la salud, dentales y de Wellsite geologist. Resumen Tener un estilo de vida saludable y recibir cuidados preventivos pueden ayudar a Research scientist (physical sciences) salud y el bienestar despus de los 65 aos de [place]. Realizarse pruebas de deteccin y [employer] es la mejor manera de Engineer, manufacturing un problema de salud de forma temprana y [place] a Automotive engineer una cada. El diagnstico y tratamiento tempranos le brindan la mejor oportunidad de Chief Operating Officer las afecciones mdicas ms comunes en las personas mayores de 65 aos de edad. Las cadas son la causa principal de las fracturas de huesos y lesiones en la cabeza de personas mayores de 65 aos de edad. Tome precauciones para evitar una cada en su casa. Trabaje con el mdico para saber qu cambios que puede hacer para mejorar su salud y [place], y para prevenir las cadas. Esta informacin no  tiene Theme park manager el consejo del mdico. Asegrese de hacerle al mdico cualquier pregunta que tenga. Document Revised: 07/01/2020 Document Reviewed: 07/01/2020 Elsevier Patient Education  2023 Elsevier Inc.    Edwina Barth, MD [facility]

## 2022-05-30 NOTE — Patient Instructions (Signed)
Mantenimiento de la salud despus de los 65 aos de edad Health Maintenance After Age 69 Despus de los 65 aos de edad, corre un riesgo mayor de padecer ciertas enfermedades e infecciones a largo plazo, como tambin de sufrir lesiones por cadas. Las cadas son la causa principal de las fracturas de huesos y lesiones en la cabeza de personas mayores de 65 aos de edad. Recibir cuidados preventivos de forma regular puede ayudarlo a mantenerse saludable y en buen estado. Los cuidados preventivos incluyen realizarse anlisis de forma regular y realizar cambios en el estilo de vida segn las recomendaciones del mdico. Converse con el mdico sobre lo siguiente: Las pruebas de deteccin y los anlisis que debe realizarse. Una prueba de deteccin es un estudio que se para detectar la presencia de una enfermedad cuando no tiene sntomas. Un plan de dieta y ejercicios adecuado para usted. Qu debo saber sobre las pruebas de deteccin y los anlisis para prevenir cadas? Realizarse pruebas de deteccin y anlisis es la mejor manera de detectar un problema de salud de forma temprana. El diagnstico y tratamiento tempranos le brindan la mejor oportunidad de controlar las afecciones mdicas que son comunes despus de los 65 aos de edad. Ciertas afecciones y elecciones de estilo de vida pueden hacer que sea ms propenso a sufrir una cada. El mdico puede recomendarle lo siguiente: Controles regulares de la visin. Una visin deficiente y afecciones como las cataratas pueden hacer que sea ms propenso a sufrir una cada. Si usa lentes, asegrese de obtener una receta actualizada si su visin cambia. Revisin de medicamentos. Revise regularmente con el mdico todos los medicamentos que toma, incluidos los medicamentos de venta libre. Consulte al mdico sobre los efectos secundarios que pueden hacer que sea ms propenso a sufrir una cada. Informe al mdico si alguno de los medicamentos que toma lo hace sentir mareado o  somnoliento. Controles de fuerza y equilibrio. El mdico puede recomendar ciertos estudios para controlar su fuerza y equilibrio al estar de pie, al caminar o al cambiar de posicin. Examen de los pies. El dolor y el adormecimiento en los pies, como tambin no utilizar el calzado adecuado, pueden hacer que sea ms propenso a sufrir una cada. Pruebas de deteccin, que incluyen las siguientes: Pruebas de deteccin para la osteoporosis. La osteoporosis es una afeccin que hace que los huesos se tornen ms dbiles y se quiebren con ms facilidad. Pruebas de deteccin para la presin arterial. Los cambios en la presin arterial y los medicamentos para controlar la presin arterial pueden hacerlo sentir mareado. Prueba de deteccin de la depresin. Es ms probable que sufra una cada si tiene miedo a caerse, se siente deprimido o se siente incapaz de realizar actividades que sola hacer. Prueba de deteccin de consumo de alcohol. Beber demasiado alcohol puede afectar su equilibrio y puede hacer que sea ms propenso a sufrir una cada. Siga estas indicaciones en su casa: Estilo de vida No beba alcohol si: Su mdico le indica no hacerlo. Si bebe alcohol: Limite la cantidad que bebe a lo siguiente: De 0 a 1 medida por da para las mujeres. De 0 a 2 medidas por da para los hombres. Sepa cunta cantidad de alcohol hay en las bebidas que toma. En [place], una medida equivale a una botella de cerveza de 12 oz (355 ml), un vaso de vino de 5 oz (148 ml) o un vaso de una bebida alcohlica de alta graduacin de 1 oz (44 ml). No consuma ningn producto que   contenga nicotina o tabaco. Estos productos incluyen cigarrillos, tabaco para mascar y aparatos de vapeo, como los cigarrillos electrnicos. Si necesita ayuda para dejar de consumir estos productos, consulte al mdico. Actividad  Siga un programa de ejercicio regular para mantenerse en forma. Esto lo ayudar a mantener el equilibrio. Consulte al  mdico qu tipos de ejercicios son adecuados para usted. Si necesita un bastn o un andador, selo segn las recomendaciones del mdico. Utilice calzado con buen apoyo y suela antideslizante. Seguridad  Retire los objetos que puedan causar tropiezos tales como alfombras, cables u obstculos. Instale equipos de seguridad, como barras para sostn en los baos y barandas de seguridad en las escaleras. Mantenga las habitaciones y los pasillos bien iluminados. Indicaciones generales Hable con el mdico sobre sus riesgos de sufrir una cada. Infrmele a su mdico si: Se cae. Asegrese de informarle a su mdico acerca de todas las cadas, incluso aquellas que parecen ser menores. Se siente mareado, cansado (tiene fatiga) o siente que pierde el equilibrio. Use los medicamentos de venta libre y los recetados solamente como se lo haya indicado el mdico. Estos incluyen suplementos. Siga una dieta sana y mantenga un peso saludable. Una dieta saludable incluye productos lcteos descremados, carnes bajas en contenido de grasa (magras), fibra de granos enteros, frijoles y muchas frutas y verduras. Mantngase al da con las vacunas. Realcese los estudios de rutina de la salud, dentales y de la vista. Resumen Tener un estilo de vida saludable y recibir cuidados preventivos pueden ayudar a promover la salud y el bienestar despus de los 65 aos de edad. Realizarse pruebas de deteccin y anlisis es la mejor manera de detectar un problema de salud de forma temprana y ayudarlo a evitar una cada. El diagnstico y tratamiento tempranos le brindan la mejor oportunidad de controlar las afecciones mdicas ms comunes en las personas mayores de 65 aos de edad. Las cadas son la causa principal de las fracturas de huesos y lesiones en la cabeza de personas mayores de 65 aos de edad. Tome precauciones para evitar una cada en su casa. Trabaje con el mdico para saber qu cambios que puede hacer para mejorar su salud y  bienestar, y para prevenir las cadas. Esta informacin no tiene como fin reemplazar el consejo del mdico. Asegrese de hacerle al mdico cualquier pregunta que tenga. Document Revised: 07/01/2020 Document Reviewed: 07/01/2020 Elsevier Patient Education  2023 [employer] Inc.  

## 2022-05-30 NOTE — Assessment & Plan Note (Signed)
Stable.  Pain management discussed Tylenol helps No complications and no concerns today.

## 2022-05-30 NOTE — Therapy (Signed)
OUTPATIENT PHYSICAL THERAPY CERVICAL EVALUATION   Patient Name: Christine Hawkins MRN: [id number] DOB:Apr 08, 1955, 67 y.o., female Today's Date: 06/01/2022  END OF SESSION:  PT End of Session - 06/01/22 1415     Visit Number 1    Date for PT Re-Evaluation 07/27/22    Authorization Type [employer]    PT Start Time 1416    PT Stop Time 1502    PT Time Calculation (min) 46 min    Activity Tolerance Patient tolerated treatment well    Behavior During Therapy WFL for tasks assessed/performed             Past Medical History:  Diagnosis Date   Anxiety    Colon polyps    Depression    GERD (gastroesophageal reflux disease)    High cholesterol    Hypertension    Osteopenia    PONV (postoperative nausea and vomiting)    Pre-diabetes    Past Surgical History:  Procedure Laterality Date   ANTERIOR CERVICAL DECOMP/DISCECTOMY FUSION N/A 03/17/2022   Procedure: Anterior Cervical Decompression Fusion Cervical three-four, removal CERVICAL PLATE;  Surgeon: Bedelia Person, MD;  Location: [facility] OR;  Service: Neurosurgery;  Laterality: N/A;   BREAST LUMPECTOMY Left    CERVICAL SPINE SURGERY      2013 has 8 screws    CESAREAN SECTION     COLONOSCOPY  08/26/2009   Normal colonoscopy up to cecum    LEFT HEART CATHETERIZATION WITH CORONARY ANGIOGRAM N/A 06/07/2012   Procedure: LEFT HEART CATHETERIZATION WITH CORONARY ANGIOGRAM;  Surgeon: Pamella Pert, MD;  Location: [facility];  Service: Cardiovascular;  Laterality: N/A;   Patient Active Problem List   Diagnosis Date Noted   Stenosis of cervical spine with myelopathy 03/17/2022   S/P cervical spinal fusion 03/17/2022   Degenerative disc disease, lumbar 11/25/2021   Osteopenia 09/01/2020   Depression 09/01/2020   Colon polyps 09/01/2020   Anxiety 09/01/2020   Trigger ring finger of right hand 07/07/2020   Chronic neck pain 11/07/2017   Bilateral carotid artery stenosis 05/11/2017   Insomnia due to other mental disorder 04/19/2016    Chronic bilateral low back pain without sciatica 09/14/2015   Prediabetes 04/02/2015   Hyperlipidemia LDL goal <130 04/02/2015   Gastroesophageal reflux disease without esophagitis 04/02/2015   Intrinsic eczema 04/02/2015   Recurrent major depressive disorder, in full remission 04/02/2015   Carpal tunnel syndrome of right wrist 01/30/2015   Bilateral leg edema 01/30/2015   Status post cervical spinal arthrodesis 05/22/2011   Primary osteoarthritis involving multiple joints 12/01/2006   Dyslipidemia 05/12/2006   COLONIC POLYPS, BENIGN, HX OF 05/12/2006    PCP: Georgina Quint, MD  REFERRING PROVIDER: Clovis Riley, PA-C  REFERRING DIAG: [phone] (ICD-10-CM) - Fusion of spine, cervical region  THERAPY DIAG:  Cervicalgia  Abnormal posture  Muscle weakness (generalized)  Rationale for Evaluation and Treatment: Rehabilitation  ONSET DATE: 03/17/2022 cervical fusion  SUBJECTIVE:  SUBJECTIVE STATEMENT: Son present and assisting with interpreting with pt consent, appreciate his assist.  Since surgery her pain is much better, but still has pain with turning her head. Feels it in her mid-back also. Notes she has had chronic low back pain and is planning to follow up with her doctor about it, thinks she may want to try PT for it.  Prior to surgery was having numbness/tingling in all four extremities, notes this has improved since surgery and her balance seems to have improved. Occasional L sided headaches in ram's horn distribution.  Saw surgeon 2-3 weeks ago, states precautions have been liberated but encouraged pain free movement and no lifting >10#.   Hand dominance: Right  PERTINENT HISTORY:  carotid stenosis, GERD, osteopenia, multiple MSK issues including cervical fusion,  prediabetes, anxiety/depression  PAIN:  Are you having pain: none Location/description: neck  Best-worst over past week: 0-10/10  - aggravating factors: bending forward, reaching forward  - Easing factors: rest    PRECAUTIONS: Cervical (per pt, restrictions have been liberated but was advised to avoid painful movement and lift no more than 10#)  WEIGHT BEARING RESTRICTIONS: No  FALLS:  Has patient fallen in last 6 months? No  LIVING ENVIRONMENT: Lives w/ husband (separated), 1 story house, no STE Before surgery has been having someone assist with cleaning Has shower chair, walkin shower   OCCUPATION: retired Personnel officer  PLOF: Independent  PATIENT GOALS: sewing, be more independent   NEXT MD VISIT: 4 months   OBJECTIVE:   DIAGNOSTIC FINDINGS:  S/p ACDF 03/17/22  PATIENT SURVEYS:  FOTO 44 current, 53 predicted  COGNITION: Overall cognitive status: Within functional limits for tasks assessed  SENSATION: Does not endorse any sensory issues since surgery  POSTURE: guarded UT posture, otherwise WNL  PALPATION: Generalized tightness periscapular/cervical musculature BIL, concordant pain with palpation of L rhomboid/mid trap and LS   CERVICAL ROM:   Active ROM A/PROM (deg) eval  Flexion 25% stiffness  Extension   Right lateral flexion   Left lateral flexion   Right rotation 28 deg *  Left rotation 18 deg *   (Blank rows = not tested) (Key: WFL = within functional limits not formally assessed, * = concordant pain, s = stiffness/stretching sensation, NT = not tested)  Comments: transient discomfort with ROM as above  UPPER EXTREMITY ROM:  A/PROM Right eval Left eval  Shoulder flexion    Shoulder abduction    Shoulder internal rotation    Shoulder external rotation    Elbow flexion    Elbow extension    Wrist flexion    Wrist extension     (Blank rows = not tested) (Key: WFL = within functional limits not formally assessed, * = concordant pain, s =  stiffness/stretching sensation, NT = not tested)  Comments: ~ >120 deg B UE grossly without pain   UPPER EXTREMITY MMT:  MMT Right eval Left eval  Shoulder flexion    Shoulder extension    Shoulder abduction    Shoulder extension    Shoulder internal rotation    Shoulder external rotation    Elbow flexion    Elbow extension    Grip strength    (Blank rows = not tested)  (Key: WFL = within functional limits not formally assessed, * = concordant pain, s = stiffness/stretching sensation, NT = not tested)  Comments:   CERVICAL SPECIAL TESTS:  Deferred given recency of surgery  FUNCTIONAL TESTS:  Limited overhead reach as above  TODAY'S TREATMENT:  [facility] Adult PT Treatment:                                                DATE: 06/01/22 Therapeutic Exercise: Scapular retraction x10  Education on HEP, rationale, and importance of pain free movement  PATIENT EDUCATION:  Education details: Pt education on PT impairments, prognosis, and POC. Informed consent. Rationale for interventions, safe/appropriate HEP performance Person educated: Patient and son Education method: Explanation, Demonstration, Tactile cues, Verbal cues, and Handouts Education comprehension: verbalized understanding, returned demonstration, verbal cues required, tactile cues required, and needs further education    HOME EXERCISE PROGRAM: Access Code: [email or web address] URL: [email or web address] Date: 06/01/2022 Prepared by: Fransisco Hertz  Exercises - Seated Scapular Retraction  - 1 x daily - 7 x weekly - 3 sets - 10 reps  ASSESSMENT:  CLINICAL IMPRESSION: Pt is a pleasant 67 year old woman who arrives to PT evaluation on this date for neck pain s/p ACDF 03/17/22. Pt reports difficulty with daily activities such as reaching, driving, and general housework due to pain/stiffness. During  today's session pt demonstrates limitations as expected post op in regards to cervical mobility and tenderness/tightness of periscapular/cervical musculature which are limiting ability to perform aforementioned activities. Deferred MMT given recency of surgery, advised pt to maintain ROM within pain free range as she states her precautions were liberalized but she was advised to avoid pain for now. No adverse events, pt tolerates HEP well with cues as above. Recommend skilled PT to address aforementioned deficits to improve functional independence/tolerance.   OBJECTIVE IMPAIRMENTS: decreased activity tolerance, decreased endurance, decreased ROM, decreased strength, impaired UE functional use, improper body mechanics, postural dysfunction, and pain.   ACTIVITY LIMITATIONS: carrying, lifting, bending, dressing, reach over head, and hygiene/grooming  PARTICIPATION LIMITATIONS: meal prep, cleaning, laundry, driving, and community activity  PERSONAL FACTORS: Age, Time since onset of injury/illness/exacerbation, and 3+ comorbidities: anxiety/depression, osteopenia, prediabetes  are also affecting patient's functional outcome.   REHAB POTENTIAL: Good  CLINICAL DECISION MAKING: Stable/uncomplicated  EVALUATION COMPLEXITY: Low   GOALS: Goals reviewed with patient? No  SHORT TERM GOALS: Target date: 06/29/2022   Pt will demonstrate appropriate understanding and performance of initially prescribed HEP in order to facilitate improved independence with management of symptoms.  Baseline: HEP provided on eval Goal status: INITIAL   2. Pt will score greater than or equal to 49 on FOTO in order to demonstrate improved perception of function due to symptoms.  Baseline: 44  Goal status: INITIAL    LONG TERM GOALS: Target date: 07/27/2022 Pt will score 53 on FOTO in order to demonstrate improved perception of function due to symptoms. Baseline: 44 Goal status: INITIAL  2. Pt will demonstrate at least  45 degrees of active cervical ROM in order to demonstrate improved environmental awareness and safety with driving.  Baseline: see ROM chart above Goal status: INITIAL  3. Pt will demonstrate at least 4+/5 shoulder MMT for improved symmetry of UE strength and improved tolerance to functional movements.  Baseline: NT given recency of surgery Goal status: INITIAL   4. Pt will report at least 50% decrease in overall pain levels in past week in order to facilitate improved tolerance to basic ADLs/mobility.   Baseline: 0-10/10 reported in past week  Goal status: INITIAL    5. Pt will demonstrate appropriate performance of final prescribed HEP in order to facilitate  improved self-management of symptoms post-discharge.   Baseline: initial HEP prescribed  Goal status: INITIAL     PLAN:  PT FREQUENCY: 1-2x/week (plan for 2x/week for 4 weeks, taper to 1x/week for 4 weeks)  PT DURATION: 8 weeks  PLANNED INTERVENTIONS: Therapeutic exercises, Therapeutic activity, Neuromuscular re-education, Balance training, Gait training, Patient/Family education, Self Care, Joint mobilization, Vestibular training, Aquatic Therapy, Dry Needling, Electrical stimulation, Spinal mobilization, Cryotherapy, Moist heat, Taping, Manual therapy, and Re-evaluation  PLAN FOR NEXT SESSION: review/update HEP PRN. Emphasis on postural mobility. Pain free cervical mobility. Manual PRN, would likely benefit from STM   Ashley Murrain PT, DPT 06/01/2022 5:59 PM

## 2022-06-01 ENCOUNTER — Other Ambulatory Visit: Payer: Self-pay

## 2022-06-01 ENCOUNTER — Encounter: Payer: Self-pay | Admitting: Physical Therapy

## 2022-06-01 ENCOUNTER — Ambulatory Visit: Payer: 59 | Attending: Emergency Medicine | Admitting: Physical Therapy

## 2022-06-01 DIAGNOSIS — M6281 Muscle weakness (generalized): Secondary | ICD-10-CM | POA: Diagnosis not present

## 2022-06-01 DIAGNOSIS — R293 Abnormal posture: Secondary | ICD-10-CM | POA: Diagnosis not present

## 2022-06-01 DIAGNOSIS — M542 Cervicalgia: Secondary | ICD-10-CM | POA: Insufficient documentation

## 2022-06-06 ENCOUNTER — Telehealth: Payer: Self-pay

## 2022-06-06 ENCOUNTER — Telehealth: Payer: Self-pay | Admitting: Cardiology

## 2022-06-06 DIAGNOSIS — E785 Hyperlipidemia, unspecified: Secondary | ICD-10-CM

## 2022-06-06 MED ORDER — NEXLIZET 180-10 MG PO TABS: 1.0000 | ORAL_TABLET | Freq: Every day | ORAL | 2 refills | Status: AC

## 2022-06-06 NOTE — Telephone Encounter (Signed)
Patient notified of results and recommendations and agreed with plan, medication sent, lab order on file.  I sent message to Prior Auth team to initiate Prior Auth

## 2022-06-06 NOTE — Telephone Encounter (Signed)
Patient is returning CMA's call for her lab results. Please advise. 

## 2022-06-06 NOTE — Telephone Encounter (Signed)
-----   Message from Neena Rhymes, RN sent at 06/02/2022 11:57 AM EDT -----  ----- Message ----- From: Georgeanna Lea, MD Sent: 06/01/2022   8:16 PM EDT To: Neena Rhymes, RN  Cholesterol still not where it supposed to be.  Please switch Zetia to Nexlizet, fasting lipid profile AST LT 6 weeks

## 2022-06-07 ENCOUNTER — Telehealth: Payer: Self-pay

## 2022-06-07 ENCOUNTER — Other Ambulatory Visit (HOSPITAL_COMMUNITY): Payer: Self-pay

## 2022-06-07 NOTE — Telephone Encounter (Signed)
-----   Message from Carolan Clines, CPhT sent at 06/07/2022 11:34 AM EDT ----- Regarding: RE: Nexlizet Christine Hawkins,                           This patient is able to go pick up her medication. This rx doesn't require a p/a and its currently filled at her pharmacy for a 6month supply and a cost of 0.00 ----- Message ----- From: Heywood Bene, CMA Sent: 06/06/2022   2:40 PM EDT To: Rx Prior Auth Team Subject: Nexlizet                                       Please initiate PA for Nexlizet. Patient  has tried and failed to Rosuvastatin and Zetia .

## 2022-06-15 ENCOUNTER — Encounter: Payer: Self-pay | Admitting: Physical Therapy

## 2022-06-15 ENCOUNTER — Ambulatory Visit: Payer: 59 | Attending: Emergency Medicine | Admitting: Physical Therapy

## 2022-06-15 DIAGNOSIS — M542 Cervicalgia: Secondary | ICD-10-CM | POA: Diagnosis not present

## 2022-06-15 DIAGNOSIS — M6281 Muscle weakness (generalized): Secondary | ICD-10-CM | POA: Diagnosis not present

## 2022-06-15 DIAGNOSIS — R293 Abnormal posture: Secondary | ICD-10-CM | POA: Diagnosis not present

## 2022-06-15 NOTE — Therapy (Signed)
OUTPATIENT PHYSICAL THERAPY TREATMENT NOTE   Patient Name: Christine Hawkins MRN: [id number] DOB:01/18/1956, 67 y.o., female Today's Date: 06/15/2022  PCP: Georgina Quint, MD   REFERRING PROVIDER: Clovis Riley, PA-C  END OF SESSION:   PT End of Session - 06/15/22 1542     Visit Number 2    Number of Visits 13    Date for PT Re-Evaluation 07/27/22    Authorization Type [employer]    PT Start Time 1544    PT Stop Time 1631    PT Time Calculation (min) 47 min    Activity Tolerance Patient tolerated treatment well;No increased pain    Behavior During Therapy WFL for tasks assessed/performed             Past Medical History:  Diagnosis Date   Anxiety    Colon polyps    Depression    GERD (gastroesophageal reflux disease)    High cholesterol    Hypertension    Osteopenia    PONV (postoperative nausea and vomiting)    Pre-diabetes    Past Surgical History:  Procedure Laterality Date   ANTERIOR CERVICAL DECOMP/DISCECTOMY FUSION N/A 03/17/2022   Procedure: Anterior Cervical Decompression Fusion Cervical three-four, removal CERVICAL PLATE;  Surgeon: Bedelia Person, MD;  Location: [facility] OR;  Service: Neurosurgery;  Laterality: N/A;   BREAST LUMPECTOMY Left    CERVICAL SPINE SURGERY      2013 has 8 screws    CESAREAN SECTION     COLONOSCOPY  08/26/2009   Normal colonoscopy up to cecum    LEFT HEART CATHETERIZATION WITH CORONARY ANGIOGRAM N/A 06/07/2012   Procedure: LEFT HEART CATHETERIZATION WITH CORONARY ANGIOGRAM;  Surgeon: Pamella Pert, MD;  Location: [facility] CATH LAB;  Service: Cardiovascular;  Laterality: N/A;   Patient Active Problem List   Diagnosis Date Noted   Stenosis of cervical spine with myelopathy (HCC) 03/17/2022   S/P cervical spinal fusion 03/17/2022   Degenerative disc disease, lumbar 11/25/2021   Osteopenia 09/01/2020   Depression 09/01/2020   Colon polyps 09/01/2020   Anxiety 09/01/2020   Trigger ring finger of right hand 07/07/2020    Chronic neck pain 11/07/2017   Bilateral carotid artery stenosis 05/11/2017   Insomnia due to other mental disorder 04/19/2016   Chronic bilateral low back pain without sciatica 09/14/2015   Prediabetes 04/02/2015   Hyperlipidemia LDL goal <130 04/02/2015   Gastroesophageal reflux disease without esophagitis 04/02/2015   Intrinsic eczema 04/02/2015   Recurrent major depressive disorder, in full remission (HCC) 04/02/2015   Carpal tunnel syndrome of right wrist 01/30/2015   Bilateral leg edema 01/30/2015   Status post cervical spinal arthrodesis 05/22/2011   Primary osteoarthritis involving multiple joints 12/01/2006   Dyslipidemia 05/12/2006   COLONIC POLYPS, BENIGN, HX OF 05/12/2006    REFERRING DIAG: M43.22 (ICD-10-CM) - Fusion of spine, cervical region   THERAPY DIAG:  Cervicalgia  Abnormal posture  Muscle weakness (generalized)  Rationale for Evaluation and Treatment Rehabilitation  PERTINENT HISTORY: carotid stenosis, GERD, osteopenia, multiple MSK issues including cervical fusion, prediabetes, anxiety/depression   PRECAUTIONS: Cervical (per pt, restrictions have been liberalized but was advised to avoid painful movement and lift no more than 10#)   SUBJECTIVE:  SUBJECTIVE STATEMENT:   Pt states she is having a little pain today but not bad. Pt states she has been doing her exercises well, initially some discomfort but has improved and feels her muscles are more relaxed.   PAIN:  Are you having pain: 2-3/10 Location/description: neck  Best-worst over past week: 0-10/10  - aggravating factors: bending forward, reaching forward, neck movement, driving - Easing factors: rest   OBJECTIVE: (objective measures completed at initial evaluation unless otherwise dated)   DIAGNOSTIC FINDINGS:  S/p  ACDF 03/17/22   PATIENT SURVEYS:  FOTO 44 current, 53 predicted   COGNITION: Overall cognitive status: Within functional limits for tasks assessed   SENSATION: Does not endorse any sensory issues since surgery   POSTURE: guarded UT posture, otherwise WNL   PALPATION: Generalized tightness periscapular/cervical musculature BIL, concordant pain with palpation of L rhomboid/mid trap and LS          CERVICAL ROM:    Active ROM A/PROM (deg) eval  Flexion 25% stiffness  Extension    Right lateral flexion    Left lateral flexion    Right rotation 28 deg *  Left rotation 18 deg *   (Blank rows = not tested) (Key: WFL = within functional limits not formally assessed, * = concordant pain, s = stiffness/stretching sensation, NT = not tested)  Comments: transient discomfort with ROM as above   UPPER EXTREMITY ROM:   A/PROM Right eval Left eval  Shoulder flexion      Shoulder abduction      Shoulder internal rotation      Shoulder external rotation      Elbow flexion      Elbow extension      Wrist flexion      Wrist extension       (Blank rows = not tested) (Key: WFL = within functional limits not formally assessed, * = concordant pain, s = stiffness/stretching sensation, NT = not tested)  Comments: ~ >120 deg B UE grossly without pain    UPPER EXTREMITY MMT:   MMT Right eval Left eval  Shoulder flexion      Shoulder extension      Shoulder abduction      Shoulder extension      Shoulder internal rotation      Shoulder external rotation      Elbow flexion      Elbow extension      Grip strength      (Blank rows = not tested)  (Key: WFL = within functional limits not formally assessed, * = concordant pain, s = stiffness/stretching sensation, NT = not tested)  Comments:    CERVICAL SPECIAL TESTS:  Deferred given recency of surgery   FUNCTIONAL TESTS:  Limited overhead reach as above   TODAY'S TREATMENT:  [facility] Adult PT Treatment:                                                DATE: 06/15/22 Therapeutic Exercise: Scapular retractions 2x10 cues for reduced UT use UT elevation/depression open chain 2x10 cues for breath control  Cervical rotation BIL 2x5 within comfortable ROM and pacing BIL scaption 2x12 cues for posture and form  Education on relevant anatomy/physiology as it pertains to exercise in session, HEP, and general exercise outside of sessions  Manual Therapy: Seated; gentle STM lateral L UT, infraspinatus, deltoid, biceps (more emphasis on deltoid and biceps)   [facility] Adult PT Treatment:                                                DATE: 06/01/22 Therapeutic Exercise: Scapular retraction x10  Education on HEP, rationale, and importance of pain free movement   PATIENT EDUCATION:  Education details: Pt education on PT impairments, prognosis, and POC. Informed consent. Rationale for interventions, safe/appropriate HEP performance, relevant anatomy/physiology  Person educated: Patient and son Education method: Explanation, Demonstration, Tactile cues, Verbal cues, and Handouts Education comprehension: verbalized understanding, returned demonstration, verbal cues required, tactile cues required, and needs further education     HOME EXERCISE PROGRAM: Access Code: [email or web address] URL: [email or web address] Date: 06/01/2022 Prepared by: Fransisco Hertz   Exercises - Seated Scapular Retraction  - 1 x daily - 7 x weekly - 3 sets - 10 reps   ASSESSMENT:   CLINICAL IMPRESSION: 06/15/2022 Pt arrives w/ report of 2-3/10 neck/shoulder pain, states her symptoms remain about the same since initial eval although HEP helps relax muscles. Does well with HEP review today. Expanding program to include comfortable cervical/GH mobility today, pt tolerates well overall although she does have some transient lateral L shoulder  discomfort during scaption. She also endorses some transient dizziness with cervical rotation that is unchanged with cues for limited range - she states this has been consistent with cervical movement since her first cervical surgery, has been stable, and resolves during today's session ~26min after cessation of exercise. Pt endorses excellent response to gentle STM as described above with resolution of neck and shoulder pain. No adverse events, pt departs in no acute distress with all voiced questions/concerns answered appropriate from PT perspective.    Per eval - Pt is a pleasant 67 year old woman who arrives to PT evaluation on this date for neck pain s/p ACDF 03/17/22. Pt reports difficulty with daily activities such as reaching, driving, and general housework due to pain/stiffness. During today's session pt demonstrates limitations as expected post op in regards to cervical mobility and tenderness/tightness of periscapular/cervical musculature which are limiting ability to perform aforementioned activities. Deferred MMT given recency of surgery, advised pt to maintain ROM within pain free range as she states her precautions were liberalized but she was advised to avoid pain for now. No adverse events, pt tolerates HEP well with cues as above. Recommend skilled PT to address aforementioned deficits to improve functional independence/tolerance.    OBJECTIVE IMPAIRMENTS: decreased activity tolerance, decreased endurance, decreased ROM, decreased strength, impaired UE functional use, improper body mechanics, postural dysfunction, and pain.    ACTIVITY LIMITATIONS: carrying, lifting, bending, dressing, reach over head, and  hygiene/grooming   PARTICIPATION LIMITATIONS: meal prep, cleaning, laundry, driving, and community activity   PERSONAL FACTORS: Age, Time since onset of injury/illness/exacerbation, and 3+ comorbidities: anxiety/depression, osteopenia, prediabetes  are also affecting patient's functional  outcome.    REHAB POTENTIAL: Good   CLINICAL DECISION MAKING: Stable/uncomplicated   EVALUATION COMPLEXITY: Low     GOALS: Goals reviewed with patient? No   SHORT TERM GOALS: Target date: 06/29/2022   Pt will demonstrate appropriate understanding and performance of initially prescribed HEP in order to facilitate improved independence with management of symptoms.  Baseline: HEP provided on eval Goal status: INITIAL    2. Pt will score greater than or equal to 49 on FOTO in order to demonstrate improved perception of function due to symptoms.            Baseline: 44            Goal status: INITIAL     LONG TERM GOALS: Target date: 07/27/2022 Pt will score 53 on FOTO in order to demonstrate improved perception of function due to symptoms. Baseline: 44 Goal status: INITIAL   2. Pt will demonstrate at least 45 degrees of active cervical ROM in order to demonstrate improved environmental awareness and safety with driving.  Baseline: see ROM chart above Goal status: INITIAL   3. Pt will demonstrate at least 4+/5 shoulder MMT for improved symmetry of UE strength and improved tolerance to functional movements.  Baseline: NT given recency of surgery Goal status: INITIAL    4. Pt will report at least 50% decrease in overall pain levels in past week in order to facilitate improved tolerance to basic ADLs/mobility.             Baseline: 0-10/10 reported in past week            Goal status: INITIAL     5. Pt will demonstrate appropriate performance of final prescribed HEP in order to facilitate improved self-management of symptoms post-discharge.             Baseline: initial HEP prescribed            Goal status: INITIAL       PLAN:   PT FREQUENCY: 1-2x/week (plan for 2x/week for 4 weeks, taper to 1x/week for 4 weeks)   PT DURATION: 8 weeks   PLANNED INTERVENTIONS: Therapeutic exercises, Therapeutic activity, Neuromuscular re-education, Balance training, Gait training,  Patient/Family education, Self Care, Joint mobilization, Vestibular training, Aquatic Therapy, Dry Needling, Electrical stimulation, Spinal mobilization, Cryotherapy, Moist heat, Taping, Manual therapy, and Re-evaluation   PLAN FOR NEXT SESSION: review/update HEP PRN. Emphasis on postural mobility. Pain free cervical mobility. Manual PRN, would likely benefit from STM   Ashley Murrain PT, DPT 06/15/2022 4:44 PM

## 2022-06-16 ENCOUNTER — Ambulatory Visit: Payer: 59 | Admitting: Physical Therapy

## 2022-06-16 ENCOUNTER — Encounter: Payer: Self-pay | Admitting: Physical Therapy

## 2022-06-16 DIAGNOSIS — R293 Abnormal posture: Secondary | ICD-10-CM | POA: Diagnosis not present

## 2022-06-16 DIAGNOSIS — M6281 Muscle weakness (generalized): Secondary | ICD-10-CM | POA: Diagnosis not present

## 2022-06-16 DIAGNOSIS — M542 Cervicalgia: Secondary | ICD-10-CM

## 2022-06-16 NOTE — Therapy (Addendum)
OUTPATIENT PHYSICAL THERAPY TREATMENT NOTE   Patient Name: Christine Hawkins MRN: [id number] DOB:06/28/1955, 67 y.o., female Today's Date: 06/16/2022  PCP: Georgina Quint, MD   REFERRING PROVIDER: Clovis Riley, PA-C  END OF SESSION:   PT End of Session - 06/16/22 1637     Visit Number 3    Number of Visits 13    Date for PT Re-Evaluation 07/27/22    Authorization Type [employer]    PT Start Time 1638   late check in   PT Stop Time 1720    PT Time Calculation (min) 42 min    Activity Tolerance Patient tolerated treatment well;No increased pain    Behavior During Therapy WFL for tasks assessed/performed              Past Medical History:  Diagnosis Date   Anxiety    Colon polyps    Depression    GERD (gastroesophageal reflux disease)    High cholesterol    Hypertension    Osteopenia    PONV (postoperative nausea and vomiting)    Pre-diabetes    Past Surgical History:  Procedure Laterality Date   ANTERIOR CERVICAL DECOMP/DISCECTOMY FUSION N/A 03/17/2022   Procedure: Anterior Cervical Decompression Fusion Cervical three-four, removal CERVICAL PLATE;  Surgeon: Bedelia Person, MD;  Location: [facility] OR;  Service: Neurosurgery;  Laterality: N/A;   BREAST LUMPECTOMY Left    CERVICAL SPINE SURGERY      2013 has 8 screws    CESAREAN SECTION     COLONOSCOPY  08/26/2009   Normal colonoscopy up to cecum    LEFT HEART CATHETERIZATION WITH CORONARY ANGIOGRAM N/A 06/07/2012   Procedure: LEFT HEART CATHETERIZATION WITH CORONARY ANGIOGRAM;  Surgeon: Pamella Pert, MD;  Location: [facility] CATH LAB;  Service: Cardiovascular;  Laterality: N/A;   Patient Active Problem List   Diagnosis Date Noted   Stenosis of cervical spine with myelopathy (HCC) 03/17/2022   S/P cervical spinal fusion 03/17/2022   Degenerative disc disease, lumbar 11/25/2021   Osteopenia 09/01/2020   Depression 09/01/2020   Colon polyps 09/01/2020   Anxiety 09/01/2020   Trigger ring finger of right  hand 07/07/2020   Chronic neck pain 11/07/2017   Bilateral carotid artery stenosis 05/11/2017   Insomnia due to other mental disorder 04/19/2016   Chronic bilateral low back pain without sciatica 09/14/2015   Prediabetes 04/02/2015   Hyperlipidemia LDL goal <130 04/02/2015   Gastroesophageal reflux disease without esophagitis 04/02/2015   Intrinsic eczema 04/02/2015   Recurrent major depressive disorder, in full remission (HCC) 04/02/2015   Carpal tunnel syndrome of right wrist 01/30/2015   Bilateral leg edema 01/30/2015   Status post cervical spinal arthrodesis 05/22/2011   Primary osteoarthritis involving multiple joints 12/01/2006   Dyslipidemia 05/12/2006   COLONIC POLYPS, BENIGN, HX OF 05/12/2006    REFERRING DIAG: M43.22 (ICD-10-CM) - Fusion of spine, cervical region   THERAPY DIAG:  Cervicalgia  Abnormal posture  Muscle weakness (generalized)  Rationale for Evaluation and Treatment Rehabilitation  PERTINENT HISTORY: carotid stenosis, GERD, osteopenia, multiple MSK issues including cervical fusion, prediabetes, anxiety/depression   PRECAUTIONS: Cervical (per pt, restrictions have been liberalized but was advised to avoid painful movement and lift no more than 10#)   SUBJECTIVE:  SUBJECTIVE STATEMENT:    Arrives w/ son who assists with translation. Pt states her lower back and L shoulder are bothering her more today, as well as her knees. Felt good after last session,  slept very well last night with less pain. No other new updates   PAIN:  Are you having pain: 0/10 neck, 5/10 L shoulder Location/description: neck  Best-worst over past week: 0-10/10  - aggravating factors: bending forward, reaching forward, neck movement, driving - Easing factors: rest   OBJECTIVE: (objective measures  completed at initial evaluation unless otherwise dated)   DIAGNOSTIC FINDINGS:  S/p ACDF 03/17/22   PATIENT SURVEYS:  FOTO 44 current, 53 predicted   COGNITION: Overall cognitive status: Within functional limits for tasks assessed   SENSATION: Does not endorse any sensory issues since surgery   POSTURE: guarded UT posture, otherwise WNL   PALPATION: Generalized tightness periscapular/cervical musculature BIL, concordant pain with palpation of L rhomboid/mid trap and LS          CERVICAL ROM:    Active ROM A/PROM (deg) eval  Flexion 25% stiffness  Extension    Right lateral flexion    Left lateral flexion    Right rotation 28 deg *  Left rotation 18 deg *   (Blank rows = not tested) (Key: WFL = within functional limits not formally assessed, * = concordant pain, s = stiffness/stretching sensation, NT = not tested)  Comments: transient discomfort with ROM as above   UPPER EXTREMITY ROM:   A/PROM Right eval Left eval  Shoulder flexion      Shoulder abduction      Shoulder internal rotation      Shoulder external rotation      Elbow flexion      Elbow extension      Wrist flexion      Wrist extension       (Blank rows = not tested) (Key: WFL = within functional limits not formally assessed, * = concordant pain, s = stiffness/stretching sensation, NT = not tested)  Comments: ~ >120 deg B UE grossly without pain    UPPER EXTREMITY MMT:   MMT Right eval Left eval  Shoulder flexion      Shoulder extension      Shoulder abduction      Shoulder extension      Shoulder internal rotation      Shoulder external rotation      Elbow flexion      Elbow extension      Grip strength      (Blank rows = not tested)  (Key: WFL = within functional limits not formally assessed, * = concordant pain, s = stiffness/stretching sensation, NT = not tested)  Comments:    CERVICAL SPECIAL TESTS:  Deferred given recency of surgery   FUNCTIONAL TESTS:  Limited overhead reach as  above   TODAY'S TREATMENT:  [facility] Adult PT Treatment:                                                DATE: 06/16/22 Therapeutic Exercise: Swiss ball GH flexion standing at table, ~90 deg, 2x12 cues for posture  Swiss ball scaption GH standing at table, ~90 deg 2x12 cues for posture  Fwd/retro shoulder rolls 2x10 each, cues for amplitude and comfortable ROM  Unresisted mid row (45 deg abd) 2x10 cues for appropriate ROM and scapular mechanics HEP update and education (emphasis on keeping scaption <90deg)  Manual Therapy: Seated; STM L deltoid, biceps, LS, UT, infraspinatus with emphasis on deltoid/biceps   [facility] Adult PT Treatment:                                                DATE: 06/15/22 Therapeutic Exercise: Scapular retractions 2x10 cues for reduced UT use UT elevation/depression open chain 2x10 cues for breath control  Cervical rotation BIL 2x5 within comfortable ROM and pacing BIL scaption 2x12 cues for posture and form  Education on relevant anatomy/physiology as it pertains to exercise in session, HEP, and general exercise outside of sessions  Manual Therapy: Seated; gentle STM lateral L UT, infraspinatus, deltoid, biceps (more emphasis on deltoid and biceps)   [facility] Adult PT Treatment:                                                DATE: 06/01/22 Therapeutic Exercise: Scapular retraction x10  Education on HEP, rationale, and importance of pain free movement   PATIENT EDUCATION:  Education details: rationale for interventions, HEP update Person educated: Patient and son Education method: Explanation, Demonstration, Tactile cues, Verbal cues, and Handouts Education comprehension: verbalized understanding, returned demonstration, verbal cues required, tactile cues required, and needs further education     HOME EXERCISE PROGRAM: Access Code: [email or web address] URL:  [email or web address] Date: 06/16/2022 Prepared by: Fransisco Hertz  Exercises - Seated Scapular Retraction  - 1 x daily - 7 x weekly - 3 sets - 10 reps - Shoulder Rolls in Sitting  - 1 x daily - 7 x weekly - 2 sets - 10 reps - Seated Shoulder Scaption Slide at Table Top with Forearm in Neutral  - 1 x daily - 7 x weekly - 3 sets - 10 reps   ASSESSMENT:   CLINICAL IMPRESSION: 06/16/2022 Pt arrives w/o neck pain, report of 5/10 L shoulder pain in deltoid/biceps, states she felt good after last session with improved pain. Today focusing on continued GH/periscapular mobility within tolerated ranges - noted that pt has some mild shoulder discomfort w/ fwd flexion swiss ball that is non worsening, resolves with scaption. Pt endorses gradual improvement in neck stiffness and GH pain as session goes on. Most relief reported with gentle manual at end of session as described above. HEP updated with emphasis on pain free ROM and appropriate performance. No adverse events, pt departs with report of improved neck stiffness, significantly reduced shoulder pain, and mild increase in low back pain which is likely attributable to prolonged unsupported sitting with exercise. Recommend continuing along current POC in  order to address relevant deficits and improve functional tolerance. Pt departs today's session in no acute distress, all voiced questions/concerns addressed appropriately from PT perspective.     Per eval - Pt is a pleasant 67 year old woman who arrives to PT evaluation on this date for neck pain s/p ACDF 03/17/22. Pt reports difficulty with daily activities such as reaching, driving, and general housework due to pain/stiffness. During today's session pt demonstrates limitations as expected post op in regards to cervical mobility and tenderness/tightness of periscapular/cervical musculature which are limiting ability to perform aforementioned activities. Deferred MMT given recency of surgery,  advised pt to maintain ROM within pain free range as she states her precautions were liberalized but she was advised to avoid pain for now. No adverse events, pt tolerates HEP well with cues as above. Recommend skilled PT to address aforementioned deficits to improve functional independence/tolerance.    OBJECTIVE IMPAIRMENTS: decreased activity tolerance, decreased endurance, decreased ROM, decreased strength, impaired UE functional use, improper body mechanics, postural dysfunction, and pain.    ACTIVITY LIMITATIONS: carrying, lifting, bending, dressing, reach over head, and hygiene/grooming   PARTICIPATION LIMITATIONS: meal prep, cleaning, laundry, driving, and community activity   PERSONAL FACTORS: Age, Time since onset of injury/illness/exacerbation, and 3+ comorbidities: anxiety/depression, osteopenia, prediabetes  are also affecting patient's functional outcome.    REHAB POTENTIAL: Good   CLINICAL DECISION MAKING: Stable/uncomplicated   EVALUATION COMPLEXITY: Low     GOALS: Goals reviewed with patient? No   SHORT TERM GOALS: Target date: 06/29/2022   Pt will demonstrate appropriate understanding and performance of initially prescribed HEP in order to facilitate improved independence with management of symptoms.  Baseline: HEP provided on eval Goal status: INITIAL    2. Pt will score greater than or equal to 49 on FOTO in order to demonstrate improved perception of function due to symptoms.            Baseline: 44            Goal status: INITIAL     LONG TERM GOALS: Target date: 07/27/2022 Pt will score 53 on FOTO in order to demonstrate improved perception of function due to symptoms. Baseline: 44 Goal status: INITIAL   2. Pt will demonstrate at least 45 degrees of active cervical ROM in order to demonstrate improved environmental awareness and safety with driving.  Baseline: see ROM chart above Goal status: INITIAL   3. Pt will demonstrate at least 4+/5 shoulder MMT for  improved symmetry of UE strength and improved tolerance to functional movements.  Baseline: NT given recency of surgery Goal status: INITIAL    4. Pt will report at least 50% decrease in overall pain levels in past week in order to facilitate improved tolerance to basic ADLs/mobility.             Baseline: 0-10/10 reported in past week            Goal status: INITIAL     5. Pt will demonstrate appropriate performance of final prescribed HEP in order to facilitate improved self-management of symptoms post-discharge.             Baseline: initial HEP prescribed            Goal status: INITIAL       PLAN:   PT FREQUENCY: 1-2x/week (plan for 2x/week for 4 weeks, taper to 1x/week for 4 weeks)   PT DURATION: 8 weeks   PLANNED INTERVENTIONS: Therapeutic exercises, Therapeutic activity, Neuromuscular re-education, Balance training,  Gait training, Patient/Family education, Self Care, Joint mobilization, Vestibular training, Aquatic Therapy, Dry Needling, Electrical stimulation, Spinal mobilization, Cryotherapy, Moist heat, Taping, Manual therapy, and Re-evaluation   PLAN FOR NEXT SESSION: review/update HEP PRN. Emphasis on postural mobility. Pain free cervical mobility. Manual PRN, would likely benefit from STM  Ashley Murrain PT, DPT 06/16/2022 5:30 PM   Addendum to include HEP update: Ashley Murrain PT, DPT 06/16/2022 5:33 PM

## 2022-06-22 NOTE — Therapy (Signed)
OUTPATIENT PHYSICAL THERAPY TREATMENT NOTE   Patient Name: Christine Hawkins MRN: [id number] DOB:[birth year], 67 y.o., female Today's Date: 06/23/2022  PCP: Georgina Quint, MD   REFERRING PROVIDER: Clovis Riley, PA-C  END OF SESSION:   PT End of Session - 06/23/22 1500     Visit Number 4    Number of Visits 13    Date for PT Re-Evaluation 07/27/22    Authorization Type [employer]    PT Start Time 1502    PT Stop Time 1548    PT Time Calculation (min) 46 min    Activity Tolerance Patient tolerated treatment well;No increased pain    Behavior During Therapy WFL for tasks assessed/performed               Past Medical History:  Diagnosis Date   Anxiety    Colon polyps    Depression    GERD (gastroesophageal reflux disease)    High cholesterol    Hypertension    Osteopenia    PONV (postoperative nausea and vomiting)    Pre-diabetes    Past Surgical History:  Procedure Laterality Date   ANTERIOR CERVICAL DECOMP/DISCECTOMY FUSION N/A 03/17/2022   Procedure: Anterior Cervical Decompression Fusion Cervical three-four, removal CERVICAL PLATE;  Surgeon: Bedelia Person, MD;  Location: [facility] OR;  Service: Neurosurgery;  Laterality: N/A;   BREAST LUMPECTOMY Left    CERVICAL SPINE SURGERY      2013 has 8 screws    CESAREAN SECTION     COLONOSCOPY  08/26/2009   Normal colonoscopy up to cecum    LEFT HEART CATHETERIZATION WITH CORONARY ANGIOGRAM N/A 06/07/2012   Procedure: LEFT HEART CATHETERIZATION WITH CORONARY ANGIOGRAM;  Surgeon: Pamella Pert, MD;  Location: [facility] CATH LAB;  Service: Cardiovascular;  Laterality: N/A;   Patient Active Problem List   Diagnosis Date Noted   Stenosis of cervical spine with myelopathy (HCC) 03/17/2022   S/P cervical spinal fusion 03/17/2022   Degenerative disc disease, lumbar 11/25/2021   Osteopenia 09/01/2020   Depression 09/01/2020   Colon polyps 09/01/2020   Anxiety 09/01/2020   Trigger ring finger of right hand 07/07/2020    Chronic neck pain 11/07/2017   Bilateral carotid artery stenosis 05/11/2017   Insomnia due to other mental disorder 04/19/2016   Chronic bilateral low back pain without sciatica 09/14/2015   Prediabetes 04/02/2015   Hyperlipidemia LDL goal <130 04/02/2015   Gastroesophageal reflux disease without esophagitis 04/02/2015   Intrinsic eczema 04/02/2015   Recurrent major depressive disorder, in full remission (HCC) 04/02/2015   Carpal tunnel syndrome of right wrist 01/30/2015   Bilateral leg edema 01/30/2015   Status post cervical spinal arthrodesis 05/22/2011   Primary osteoarthritis involving multiple joints 12/01/2006   Dyslipidemia 05/12/2006   COLONIC POLYPS, BENIGN, HX OF 05/12/2006    REFERRING DIAG: M43.22 (ICD-10-CM) - Fusion of spine, cervical region   THERAPY DIAG:  Cervicalgia  Abnormal posture  Muscle weakness (generalized)  Rationale for Evaluation and Treatment Rehabilitation  PERTINENT HISTORY: carotid stenosis, GERD, osteopenia, multiple MSK issues including cervical fusion, prediabetes, anxiety/depression   PRECAUTIONS: Cervical (per pt, restrictions have been liberalized but was advised to avoid painful movement and lift no more than 10#)   SUBJECTIVE:  SUBJECTIVE STATEMENT:   Arrives unaccompanied, use of video interpreter. Her son arrives after a few minutes, she voices preference for family interpreting which is respected. States she felt good after last session in regards to neck, although back remains irritable. A little bit of pain in her shoulder. Exercises seem to be helping her neck/shoulder pain   PAIN:  Are you having pain: 0/10 neck, 5/10 L shoulder Location/description: neck  Best-worst over past week: 0-10/10  - aggravating factors: bending forward, reaching forward,  neck movement, driving - Easing factors: rest   OBJECTIVE: (objective measures completed at initial evaluation unless otherwise dated)   DIAGNOSTIC FINDINGS:  S/p ACDF 03/17/22   PATIENT SURVEYS:  FOTO 44 current, 53 predicted   COGNITION: Overall cognitive status: Within functional limits for tasks assessed   SENSATION: Does not endorse any sensory issues since surgery   POSTURE: guarded UT posture, otherwise WNL   PALPATION: Generalized tightness periscapular/cervical musculature BIL, concordant pain with palpation of L rhomboid/mid trap and LS          CERVICAL ROM:    Active ROM A/PROM (deg) eval  Flexion 25% stiffness  Extension    Right lateral flexion    Left lateral flexion    Right rotation 28 deg *  Left rotation 18 deg *   (Blank rows = not tested) (Key: WFL = within functional limits not formally assessed, * = concordant pain, s = stiffness/stretching sensation, NT = not tested)  Comments: transient discomfort with ROM as above   UPPER EXTREMITY ROM:   A/PROM Right eval Left eval  Shoulder flexion      Shoulder abduction      Shoulder internal rotation      Shoulder external rotation      Elbow flexion      Elbow extension      Wrist flexion      Wrist extension       (Blank rows = not tested) (Key: WFL = within functional limits not formally assessed, * = concordant pain, s = stiffness/stretching sensation, NT = not tested)  Comments: ~ >120 deg B UE grossly without pain    UPPER EXTREMITY MMT:   MMT Right eval Left eval  Shoulder flexion      Shoulder extension      Shoulder abduction      Shoulder extension      Shoulder internal rotation      Shoulder external rotation      Elbow flexion      Elbow extension      Grip strength      (Blank rows = not tested)  (Key: WFL = within functional limits not formally assessed, * = concordant pain, s = stiffness/stretching sensation, NT = not tested)  Comments:    CERVICAL SPECIAL TESTS:   Deferred given recency of surgery   FUNCTIONAL TESTS:  Limited overhead reach as above   TODAY'S TREATMENT:  [facility] Adult PT Treatment:                                                DATE: 06/23/22 Therapeutic Exercise: Swiss ball flexion/scaption at table ~90 deg x10 each  Double ER + scapular retraction unresisted 2x8 cues for posture High row (<90 deg ) unresisted 3x8  HEP update + education on appropriate performance  Manual Therapy: STM L lateral deltoid, infraspinatus, UT, LS  Therapeutic Activity: Education/discussion re: pt symptoms, activity tolerance/modification, accommodating exercises for low back pain, communication w/ provider re: options for low back   [facility] Adult PT Treatment:                                                DATE: 06/16/22 Therapeutic Exercise: Swiss ball GH flexion standing at table, ~90 deg, 2x12 cues for posture  Swiss ball scaption GH standing at table, ~90 deg 2x12 cues for posture  Fwd/retro shoulder rolls 2x10 each, cues for amplitude and comfortable ROM  Unresisted mid row (45 deg abd) 2x10 cues for appropriate ROM and scapular mechanics HEP update and education (emphasis on keeping scaption <90deg)  Manual Therapy: Seated; STM L deltoid, biceps, LS, UT, infraspinatus with emphasis on deltoid/biceps   [facility] Adult PT Treatment:                                                DATE: 06/15/22 Therapeutic Exercise: Scapular retractions 2x10 cues for reduced UT use UT elevation/depression open chain 2x10 cues for breath control  Cervical rotation BIL 2x5 within comfortable ROM and pacing BIL scaption 2x12 cues for posture and form  Education on relevant anatomy/physiology as it pertains to exercise in session, HEP, and general exercise outside of sessions  Manual Therapy: Seated; gentle STM lateral L UT, infraspinatus, deltoid,  biceps (more emphasis on deltoid and biceps)   [facility] Adult PT Treatment:                                                DATE: 06/01/22 Therapeutic Exercise: Scapular retraction x10  Education on HEP, rationale, and importance of pain free movement   PATIENT EDUCATION:  Education details: rationale for interventions, HEP  Person educated: Patient and son Education method: Explanation, Demonstration, Tactile cues, Verbal cues, and Handouts Education comprehension: verbalized understanding, returned demonstration, verbal cues required, tactile cues required, and needs further education     HOME EXERCISE PROGRAM: Access Code: [email or web address] URL: [email or web address] Date: 06/16/2022 Prepared by: Fransisco Hertz  Exercises - Seated Scapular Retraction  - 1 x daily - 7 x weekly - 3 sets - 10 reps - Shoulder Rolls in Sitting  - 1 x daily - 7 x weekly - 2 sets - 10 reps - Seated Shoulder Scaption Slide at Table Top with Forearm in Neutral  - 1 x daily - 7 x weekly - 3 sets - 10 reps   ASSESSMENT:   CLINICAL IMPRESSION: 06/23/2022 Pt arrives w/o neck  pain, mild shoulder pain, and continued low back pain. Continuing to spend time discussing activity modification and symptom behavior as pt and her son indicate that her low back has been the more irritable symptom - education provided on monitoring, communication with providers, and importance of facilitating maximized outcome for cervical surgery. Resolution of neck/shoulder pain with manual post session, denies any neck/shoulder pain on departure. HEP updated as above. Pt departs today's session in no acute distress, all voiced questions/concerns addressed appropriately from PT perspective.     Per eval - Pt is a pleasant 67 year old woman who arrives to PT evaluation on this date for neck pain s/p ACDF 03/17/22. Pt reports difficulty with daily activities such as reaching, driving, and general housework due to pain/stiffness. During today's  session pt demonstrates limitations as expected post op in regards to cervical mobility and tenderness/tightness of periscapular/cervical musculature which are limiting ability to perform aforementioned activities. Deferred MMT given recency of surgery, advised pt to maintain ROM within pain free range as she states her precautions were liberalized but she was advised to avoid pain for now. No adverse events, pt tolerates HEP well with cues as above. Recommend skilled PT to address aforementioned deficits to improve functional independence/tolerance.    OBJECTIVE IMPAIRMENTS: decreased activity tolerance, decreased endurance, decreased ROM, decreased strength, impaired UE functional use, improper body mechanics, postural dysfunction, and pain.    ACTIVITY LIMITATIONS: carrying, lifting, bending, dressing, reach over head, and hygiene/grooming   PARTICIPATION LIMITATIONS: meal prep, cleaning, laundry, driving, and community activity   PERSONAL FACTORS: Age, Time since onset of injury/illness/exacerbation, and 3+ comorbidities: anxiety/depression, osteopenia, prediabetes  are also affecting patient's functional outcome.    REHAB POTENTIAL: Good   CLINICAL DECISION MAKING: Stable/uncomplicated   EVALUATION COMPLEXITY: Low     GOALS: Goals reviewed with patient? No   SHORT TERM GOALS: Target date: 06/29/2022   Pt will demonstrate appropriate understanding and performance of initially prescribed HEP in order to facilitate improved independence with management of symptoms.  Baseline: HEP provided on eval Goal status: INITIAL    2. Pt will score greater than or equal to 49 on FOTO in order to demonstrate improved perception of function due to symptoms.            Baseline: 44            Goal status: INITIAL     LONG TERM GOALS: Target date: 07/27/2022 Pt will score 53 on FOTO in order to demonstrate improved perception of function due to symptoms. Baseline: 44 Goal status: INITIAL   2. Pt  will demonstrate at least 45 degrees of active cervical ROM in order to demonstrate improved environmental awareness and safety with driving.  Baseline: see ROM chart above Goal status: INITIAL   3. Pt will demonstrate at least 4+/5 shoulder MMT for improved symmetry of UE strength and improved tolerance to functional movements.  Baseline: NT given recency of surgery Goal status: INITIAL    4. Pt will report at least 50% decrease in overall pain levels in past week in order to facilitate improved tolerance to basic ADLs/mobility.             Baseline: 0-10/10 reported in past week            Goal status: INITIAL     5. Pt will demonstrate appropriate performance of final prescribed HEP in order to facilitate improved self-management of symptoms post-discharge.  Baseline: initial HEP prescribed            Goal status: INITIAL       PLAN:   PT FREQUENCY: 1-2x/week (plan for 2x/week for 4 weeks, taper to 1x/week for 4 weeks)   PT DURATION: 8 weeks   PLANNED INTERVENTIONS: Therapeutic exercises, Therapeutic activity, Neuromuscular re-education, Balance training, Gait training, Patient/Family education, Self Care, Joint mobilization, Vestibular training, Aquatic Therapy, Dry Needling, Electrical stimulation, Spinal mobilization, Cryotherapy, Moist heat, Taping, Manual therapy, and Re-evaluation   PLAN FOR NEXT SESSION: review/update HEP PRN. Emphasis on postural mobility. Pain free cervical mobility. Manual PRN, would likely benefit from STM  Ashley Murrain PT, DPT 06/23/2022 5:29 PM

## 2022-06-23 ENCOUNTER — Encounter: Payer: Self-pay | Admitting: Physical Therapy

## 2022-06-23 ENCOUNTER — Ambulatory Visit: Payer: 59 | Admitting: Physical Therapy

## 2022-06-23 DIAGNOSIS — M542 Cervicalgia: Secondary | ICD-10-CM

## 2022-06-23 DIAGNOSIS — M6281 Muscle weakness (generalized): Secondary | ICD-10-CM | POA: Diagnosis not present

## 2022-06-23 DIAGNOSIS — R293 Abnormal posture: Secondary | ICD-10-CM | POA: Diagnosis not present

## 2022-06-25 ENCOUNTER — Ambulatory Visit: Payer: 59

## 2022-06-25 DIAGNOSIS — R293 Abnormal posture: Secondary | ICD-10-CM

## 2022-06-25 DIAGNOSIS — M6281 Muscle weakness (generalized): Secondary | ICD-10-CM

## 2022-06-25 DIAGNOSIS — M542 Cervicalgia: Secondary | ICD-10-CM | POA: Diagnosis not present

## 2022-06-25 NOTE — Therapy (Signed)
OUTPATIENT PHYSICAL THERAPY TREATMENT NOTE   Patient Name: Christine Hawkins MRN: [id number] DOB:Jun 25, 1955, 67 y.o., female Today's Date: 06/25/2022  PCP: Georgina Quint, MD   REFERRING PROVIDER: Clovis Riley, PA-C  END OF SESSION:   PT End of Session - 06/25/22 0948     Visit Number 5    Number of Visits 13    Date for PT Re-Evaluation 07/27/22    PT Start Time 0917    PT Stop Time 0950    PT Time Calculation (min) 33 min    Activity Tolerance Patient tolerated treatment well;No increased pain    Behavior During Therapy WFL for tasks assessed/performed                Past Medical History:  Diagnosis Date   Anxiety    Colon polyps    Depression    GERD (gastroesophageal reflux disease)    High cholesterol    Hypertension    Osteopenia    PONV (postoperative nausea and vomiting)    Pre-diabetes    Past Surgical History:  Procedure Laterality Date   ANTERIOR CERVICAL DECOMP/DISCECTOMY FUSION N/A 03/17/2022   Procedure: Anterior Cervical Decompression Fusion Cervical three-four, removal CERVICAL PLATE;  Surgeon: Bedelia Person, MD;  Location: [facility] OR;  Service: Neurosurgery;  Laterality: N/A;   BREAST LUMPECTOMY Left    CERVICAL SPINE SURGERY      2013 has 8 screws    CESAREAN SECTION     COLONOSCOPY  08/26/2009   Normal colonoscopy up to cecum    LEFT HEART CATHETERIZATION WITH CORONARY ANGIOGRAM N/A 06/07/2012   Procedure: LEFT HEART CATHETERIZATION WITH CORONARY ANGIOGRAM;  Surgeon: Pamella Pert, MD;  Location: [facility] CATH LAB;  Service: Cardiovascular;  Laterality: N/A;   Patient Active Problem List   Diagnosis Date Noted   Stenosis of cervical spine with myelopathy (HCC) 03/17/2022   S/P cervical spinal fusion 03/17/2022   Degenerative disc disease, lumbar 11/25/2021   Osteopenia 09/01/2020   Depression 09/01/2020   Colon polyps 09/01/2020   Anxiety 09/01/2020   Trigger ring finger of right hand 07/07/2020   Chronic neck pain  11/07/2017   Bilateral carotid artery stenosis 05/11/2017   Insomnia due to other mental disorder 04/19/2016   Chronic bilateral low back pain without sciatica 09/14/2015   Prediabetes 04/02/2015   Hyperlipidemia LDL goal <130 04/02/2015   Gastroesophageal reflux disease without esophagitis 04/02/2015   Intrinsic eczema 04/02/2015   Recurrent major depressive disorder, in full remission (HCC) 04/02/2015   Carpal tunnel syndrome of right wrist 01/30/2015   Bilateral leg edema 01/30/2015   Status post cervical spinal arthrodesis 05/22/2011   Primary osteoarthritis involving multiple joints 12/01/2006   Dyslipidemia 05/12/2006   COLONIC POLYPS, BENIGN, HX OF 05/12/2006    REFERRING DIAG: M43.22 (ICD-10-CM) - Fusion of spine, cervical region   THERAPY DIAG:  Cervicalgia  Abnormal posture  Muscle weakness (generalized)  Rationale for Evaluation and Treatment Rehabilitation  PERTINENT HISTORY: carotid stenosis, GERD, osteopenia, multiple MSK issues including cervical fusion, prediabetes, anxiety/depression   PRECAUTIONS: Cervical (per pt, restrictions have been liberalized but was advised to avoid painful movement and lift no more than 10#)   SUBJECTIVE:  SUBJECTIVE STATEMENT:   Patient is accompanied today by her son and prefers family interpreting which is respected. She reports no neck pain and minimal L shoulder pain. She has been compliant with HEP.    PAIN:  Are you having pain: 0/10 neck, 5/10 L shoulder Location/description: neck  Best-worst over past week: 0-10/10  - aggravating factors: bending forward, reaching forward, neck movement, driving - Easing factors: rest   OBJECTIVE: (objective measures completed at initial evaluation unless otherwise dated)   DIAGNOSTIC FINDINGS:  S/p ACDF  03/17/22   PATIENT SURVEYS:  FOTO 44 current, 53 predicted   COGNITION: Overall cognitive status: Within functional limits for tasks assessed   SENSATION: Does not endorse any sensory issues since surgery   POSTURE: guarded UT posture, otherwise WNL   PALPATION: Generalized tightness periscapular/cervical musculature BIL, concordant pain with palpation of L rhomboid/mid trap and LS          CERVICAL ROM:    Active ROM A/PROM (deg) eval  Flexion 25% stiffness  Extension    Right lateral flexion    Left lateral flexion    Right rotation 28 deg *  Left rotation 18 deg *   (Blank rows = not tested) (Key: WFL = within functional limits not formally assessed, * = concordant pain, s = stiffness/stretching sensation, NT = not tested)  Comments: transient discomfort with ROM as above   UPPER EXTREMITY ROM:   A/PROM Right eval Left eval  Shoulder flexion      Shoulder abduction      Shoulder internal rotation      Shoulder external rotation      Elbow flexion      Elbow extension      Wrist flexion      Wrist extension       (Blank rows = not tested) (Key: WFL = within functional limits not formally assessed, * = concordant pain, s = stiffness/stretching sensation, NT = not tested)  Comments: ~ >120 deg B UE grossly without pain    UPPER EXTREMITY MMT:   MMT Right eval Left eval  Shoulder flexion      Shoulder extension      Shoulder abduction      Shoulder extension      Shoulder internal rotation      Shoulder external rotation      Elbow flexion      Elbow extension      Grip strength      (Blank rows = not tested)  (Key: WFL = within functional limits not formally assessed, * = concordant pain, s = stiffness/stretching sensation, NT = not tested)  Comments:    CERVICAL SPECIAL TESTS:  Deferred given recency of surgery   FUNCTIONAL TESTS:  Limited overhead reach as above   TODAY'S TREATMENT:      [facility] Adult PT Treatment:                                                 DATE: 06/25/2022 Light resistance only   Therapeutic Exercise: Swiss ball flexion and L diagonal seated x10 each  Double ER + scapular retraction red TB 2x8 cues for posture High row (<90 deg ) red TB 3x8  Horizontal Abduction red TB 2x8 UT stretch, 2 x 30 sec each side - small range d/t cervical fusion  HEP update + education  on appropriate performance  Manual Therapy:  STM to L UT, levator, scalene, supra/infraspinatus                                                                                                                             [facility] Adult PT Treatment:                                                DATE: 06/23/22 Therapeutic Exercise: Swiss ball flexion/scaption at table ~90 deg x10 each  Double ER + scapular retraction unresisted 2x8 cues for posture High row (<90 deg ) unresisted 3x8  HEP update + education on appropriate performance  Manual Therapy: STM L lateral deltoid, infraspinatus, UT, LS  Therapeutic Activity: Education/discussion re: pt symptoms, activity tolerance/modification, accommodating exercises for low back pain, communication w/ provider re: options for low back   [facility] Adult PT Treatment:                                                DATE: 06/16/22 Therapeutic Exercise: Swiss ball GH flexion standing at table, ~90 deg, 2x12 cues for posture  Swiss ball scaption GH standing at table, ~90 deg 2x12 cues for posture  Fwd/retro shoulder rolls 2x10 each, cues for amplitude and comfortable ROM  Unresisted mid row (45 deg abd) 2x10 cues for appropriate ROM and scapular mechanics HEP update and education (emphasis on keeping scaption <90deg)  Manual Therapy: Seated; STM L deltoid, biceps, LS, UT, infraspinatus with emphasis on deltoid/biceps   [facility] Adult PT Treatment:                                                DATE: 06/15/22 Therapeutic Exercise: Scapular retractions 2x10 cues for reduced UT use UT elevation/depression open chain 2x10  cues for breath control  Cervical rotation BIL 2x5 within comfortable ROM and pacing BIL scaption 2x12 cues for posture and form  Education on relevant anatomy/physiology as it pertains to exercise in session, HEP, and general exercise outside of sessions  Manual Therapy: Seated; gentle STM lateral L UT, infraspinatus, deltoid, biceps (more emphasis on deltoid and biceps)   [facility] Adult PT Treatment:                                                DATE: 06/01/22 Therapeutic Exercise: Scapular retraction x10  Education on HEP, rationale, and importance of pain free movement   PATIENT  EDUCATION:  Education details: rationale for interventions, HEP  Person educated: Patient and son Education method: Explanation, Demonstration, Actor cues, Verbal cues, and Handouts Education comprehension: verbalized understanding, returned demonstration, verbal cues required, tactile cues required, and needs further education     HOME EXERCISE PROGRAM: Access Code: [email or web address] URL: [email or web address] Date: 06/16/2022 Prepared by: Fransisco Hertz  Exercises - Seated Scapular Retraction  - 1 x daily - 7 x weekly - 3 sets - 10 reps - Shoulder Rolls in Sitting  - 1 x daily - 7 x weekly - 2 sets - 10 reps - Seated Shoulder Scaption Slide at Table Top with Forearm in Neutral  - 1 x daily - 7 x weekly - 3 sets - 10 reps   ASSESSMENT:   CLINICAL IMPRESSION: 06/25/2022 Versie continues to have minimal pain in neck and left shoulder, but does report feeling a tightness in her L UT and cervical musculature with exercises today. Initiated light resistance activities within lifting restrictions to address musculature strength and endurance. She continue to report difficulty with lumbar pain and is interested in trying PT prior to surgical intervention.    Per eval - Pt is a pleasant 67 year old woman who arrives to PT evaluation on this date for neck pain s/p ACDF 03/17/22. Pt reports difficulty with daily  activities such as reaching, driving, and general housework due to pain/stiffness. During today's session pt demonstrates limitations as expected post op in regards to cervical mobility and tenderness/tightness of periscapular/cervical musculature which are limiting ability to perform aforementioned activities. Deferred MMT given recency of surgery, advised pt to maintain ROM within pain free range as she states her precautions were liberalized but she was advised to avoid pain for now. No adverse events, pt tolerates HEP well with cues as above. Recommend skilled PT to address aforementioned deficits to improve functional independence/tolerance.    OBJECTIVE IMPAIRMENTS: decreased activity tolerance, decreased endurance, decreased ROM, decreased strength, impaired UE functional use, improper body mechanics, postural dysfunction, and pain.    ACTIVITY LIMITATIONS: carrying, lifting, bending, dressing, reach over head, and hygiene/grooming   PARTICIPATION LIMITATIONS: meal prep, cleaning, laundry, driving, and community activity   PERSONAL FACTORS: Age, Time since onset of injury/illness/exacerbation, and 3+ comorbidities: anxiety/depression, osteopenia, prediabetes  are also affecting patient's functional outcome.    REHAB POTENTIAL: Good   CLINICAL DECISION MAKING: Stable/uncomplicated   EVALUATION COMPLEXITY: Low     GOALS: Goals reviewed with patient? No   SHORT TERM GOALS: Target date: 06/29/2022   Pt will demonstrate appropriate understanding and performance of initially prescribed HEP in order to facilitate improved independence with management of symptoms.  Baseline: HEP provided on eval Goal status: INITIAL    2. Pt will score greater than or equal to 49 on FOTO in order to demonstrate improved perception of function due to symptoms.            Baseline: 44            Goal status: INITIAL     LONG TERM GOALS: Target date: 07/27/2022 Pt will score 53 on FOTO in order to  demonstrate improved perception of function due to symptoms. Baseline: 44 Goal status: INITIAL   2. Pt will demonstrate at least 45 degrees of active cervical ROM in order to demonstrate improved environmental awareness and safety with driving.  Baseline: see ROM chart above Goal status: INITIAL   3. Pt will demonstrate at least 4+/5 shoulder MMT for improved symmetry of UE strength and improved tolerance to  functional movements.  Baseline: NT given recency of surgery Goal status: INITIAL    4. Pt will report at least 50% decrease in overall pain levels in past week in order to facilitate improved tolerance to basic ADLs/mobility.             Baseline: 0-10/10 reported in past week            Goal status: INITIAL     5. Pt will demonstrate appropriate performance of final prescribed HEP in order to facilitate improved self-management of symptoms post-discharge.             Baseline: initial HEP prescribed            Goal status: INITIAL       PLAN:   PT FREQUENCY: 1-2x/week (plan for 2x/week for 4 weeks, taper to 1x/week for 4 weeks)   PT DURATION: 8 weeks   PLANNED INTERVENTIONS: Therapeutic exercises, Therapeutic activity, Neuromuscular re-education, Balance training, Gait training, Patient/Family education, Self Care, Joint mobilization, Vestibular training, Aquatic Therapy, Dry Needling, Electrical stimulation, Spinal mobilization, Cryotherapy, Moist heat, Taping, Manual therapy, and Re-evaluation   PLAN FOR NEXT SESSION: review/update HEP PRN. Emphasis on postural mobility. Pain free cervical mobility. Manual PRN, would likely benefit from [facility]  Mauri Reading PT, DPT 06/25/2022 10:09 AM

## 2022-06-28 ENCOUNTER — Ambulatory Visit: Payer: 59

## 2022-06-28 DIAGNOSIS — M6281 Muscle weakness (generalized): Secondary | ICD-10-CM | POA: Diagnosis not present

## 2022-06-28 DIAGNOSIS — R293 Abnormal posture: Secondary | ICD-10-CM

## 2022-06-28 DIAGNOSIS — M542 Cervicalgia: Secondary | ICD-10-CM | POA: Diagnosis not present

## 2022-06-28 NOTE — Therapy (Signed)
OUTPATIENT PHYSICAL THERAPY TREATMENT NOTE   Patient Name: Christine Hawkins MRN: [id number] DOB:10/20/1955, 67 y.o., female Today's Date: 06/28/2022  PCP: Georgina Quint, MD   REFERRING PROVIDER: Clovis Riley, PA-C  END OF SESSION:   PT End of Session - 06/28/22 1121     Visit Number 6    Number of Visits 13    Date for PT Re-Evaluation 07/27/22    Authorization Type [employer]    PT Start Time 1130    PT Stop Time 1208    PT Time Calculation (min) 38 min    Activity Tolerance Patient tolerated treatment well;No increased pain    Behavior During Therapy WFL for tasks assessed/performed              Past Medical History:  Diagnosis Date   Anxiety    Colon polyps    Depression    GERD (gastroesophageal reflux disease)    High cholesterol    Hypertension    Osteopenia    PONV (postoperative nausea and vomiting)    Pre-diabetes    Past Surgical History:  Procedure Laterality Date   ANTERIOR CERVICAL DECOMP/DISCECTOMY FUSION N/A 03/17/2022   Procedure: Anterior Cervical Decompression Fusion Cervical three-four, removal CERVICAL PLATE;  Surgeon: Bedelia Person, MD;  Location: [facility] OR;  Service: Neurosurgery;  Laterality: N/A;   BREAST LUMPECTOMY Left    CERVICAL SPINE SURGERY      2013 has 8 screws    CESAREAN SECTION     COLONOSCOPY  08/26/2009   Normal colonoscopy up to cecum    LEFT HEART CATHETERIZATION WITH CORONARY ANGIOGRAM N/A 06/07/2012   Procedure: LEFT HEART CATHETERIZATION WITH CORONARY ANGIOGRAM;  Surgeon: Pamella Pert, MD;  Location: [facility] CATH LAB;  Service: Cardiovascular;  Laterality: N/A;   Patient Active Problem List   Diagnosis Date Noted   Stenosis of cervical spine with myelopathy (HCC) 03/17/2022   S/P cervical spinal fusion 03/17/2022   Degenerative disc disease, lumbar 11/25/2021   Osteopenia 09/01/2020   Depression 09/01/2020   Colon polyps 09/01/2020   Anxiety 09/01/2020   Trigger ring finger of right hand 07/07/2020    Chronic neck pain 11/07/2017   Bilateral carotid artery stenosis 05/11/2017   Insomnia due to other mental disorder 04/19/2016   Chronic bilateral low back pain without sciatica 09/14/2015   Prediabetes 04/02/2015   Hyperlipidemia LDL goal <130 04/02/2015   Gastroesophageal reflux disease without esophagitis 04/02/2015   Intrinsic eczema 04/02/2015   Recurrent major depressive disorder, in full remission (HCC) 04/02/2015   Carpal tunnel syndrome of right wrist 01/30/2015   Bilateral leg edema 01/30/2015   Status post cervical spinal arthrodesis 05/22/2011   Primary osteoarthritis involving multiple joints 12/01/2006   Dyslipidemia 05/12/2006   COLONIC POLYPS, BENIGN, HX OF 05/12/2006    REFERRING DIAG: M43.22 (ICD-10-CM) - Fusion of spine, cervical region   THERAPY DIAG:  Abnormal posture  Cervicalgia  Muscle weakness (generalized)  Rationale for Evaluation and Treatment Rehabilitation  PERTINENT HISTORY: carotid stenosis, GERD, osteopenia, multiple MSK issues including cervical fusion, prediabetes, anxiety/depression   PRECAUTIONS: Cervical (per pt, restrictions have been liberalized but was advised to avoid painful movement and lift no more than 10#)   SUBJECTIVE:  SUBJECTIVE STATEMENT:   Patient reports no current pain in her neck and Lt shoulder, but a 7/10 in her lower back today.    PAIN:  Are you having pain: 0/10 neck, 5/10 L shoulder Location/description: neck  Best-worst over past week: 0-10/10  - aggravating factors: bending forward, reaching forward, neck movement, driving - Easing factors: rest   OBJECTIVE: (objective measures completed at initial evaluation unless otherwise dated)   DIAGNOSTIC FINDINGS:  S/p ACDF 03/17/22   PATIENT SURVEYS:  FOTO 44 current, 53 predicted    COGNITION: Overall cognitive status: Within functional limits for tasks assessed   SENSATION: Does not endorse any sensory issues since surgery   POSTURE: guarded UT posture, otherwise WNL   PALPATION: Generalized tightness periscapular/cervical musculature BIL, concordant pain with palpation of L rhomboid/mid trap and LS          CERVICAL ROM:    Active ROM A/PROM (deg) eval  Flexion 25% stiffness  Extension    Right lateral flexion    Left lateral flexion    Right rotation 28 deg *  Left rotation 18 deg *   (Blank rows = not tested) (Key: WFL = within functional limits not formally assessed, * = concordant pain, s = stiffness/stretching sensation, NT = not tested)  Comments: transient discomfort with ROM as above   UPPER EXTREMITY ROM:   A/PROM Right eval Left eval  Shoulder flexion      Shoulder abduction      Shoulder internal rotation      Shoulder external rotation      Elbow flexion      Elbow extension      Wrist flexion      Wrist extension       (Blank rows = not tested) (Key: WFL = within functional limits not formally assessed, * = concordant pain, s = stiffness/stretching sensation, NT = not tested)  Comments: ~ >120 deg B UE grossly without pain    UPPER EXTREMITY MMT:   MMT Right eval Left eval  Shoulder flexion      Shoulder extension      Shoulder abduction      Shoulder extension      Shoulder internal rotation      Shoulder external rotation      Elbow flexion      Elbow extension      Grip strength      (Blank rows = not tested)  (Key: WFL = within functional limits not formally assessed, * = concordant pain, s = stiffness/stretching sensation, NT = not tested)  Comments:    CERVICAL SPECIAL TESTS:  Deferred given recency of surgery   FUNCTIONAL TESTS:  Limited overhead reach as above   TODAY'S TREATMENT:    [facility] Adult PT Treatment:                                                DATE: 06/28/22 Therapeutic Exercise: Double ER +  scapular retraction red TB 2x10 cues for posture High row (<90 deg ) red TB 2x10 Horizontal Abduction red TB 2x10 UT stretch, 2 x 30 sec each side - small range d/t cervical fusion  Fwd/retro shoulder rolls 2x10 each, cues for amplitude and comfortable ROM  Hooklying LTR x10 BIL SKTC 2x30" BIL Manual Therapy:  STM to L UT, levator, scalene, supra/infraspinatus    [facility]  Adult PT Treatment:                                                DATE: 06/25/2022 Light resistance only   Therapeutic Exercise: Swiss ball flexion and L diagonal seated x10 each  Double ER + scapular retraction red TB 2x8 cues for posture High row (<90 deg ) red TB 3x8  Horizontal Abduction red TB 2x8 UT stretch, 2 x 30 sec each side - small range d/t cervical fusion  HEP update + education on appropriate performance  Manual Therapy:  STM to L UT, levator, scalene, supra/infraspinatus                                                                                                                             [facility] Adult PT Treatment:                                                DATE: 06/23/22 Therapeutic Exercise: Swiss ball flexion/scaption at table ~90 deg x10 each  Double ER + scapular retraction unresisted 2x8 cues for posture High row (<90 deg ) unresisted 3x8  HEP update + education on appropriate performance  Manual Therapy: STM L lateral deltoid, infraspinatus, UT, LS  Therapeutic Activity: Education/discussion re: pt symptoms, activity tolerance/modification, accommodating exercises for low back pain, communication w/ provider re: options for low back   [facility] Adult PT Treatment:                                                DATE: 06/16/22 Therapeutic Exercise: Swiss ball GH flexion standing at table, ~90 deg, 2x12 cues for posture  Swiss ball scaption GH standing at table, ~90 deg 2x12 cues for posture  Fwd/retro shoulder rolls 2x10 each, cues for amplitude and comfortable ROM  Unresisted mid row (45  deg abd) 2x10 cues for appropriate ROM and scapular mechanics HEP update and education (emphasis on keeping scaption <90deg)  Manual Therapy: Seated; STM L deltoid, biceps, LS, UT, infraspinatus with emphasis on deltoid/biceps    PATIENT EDUCATION:  Education details: rationale for interventions, HEP  Person educated: Patient and son Education method: Explanation, Demonstration, Tactile cues, Verbal cues, and Handouts Education comprehension: verbalized understanding, returned demonstration, verbal cues required, tactile cues required, and needs further education     HOME EXERCISE PROGRAM: Access Code: [email or web address] URL: [email or web address] Date: 06/16/2022 Prepared by: Fransisco Hertz  Exercises - Seated Scapular Retraction  - 1 x daily - 7 x weekly - 3 sets - 10 reps - Shoulder Rolls in Sitting  -  1 x daily - 7 x weekly - 2 sets - 10 reps - Seated Shoulder Scaption Slide at Table Top with Forearm in Neutral  - 1 x daily - 7 x weekly - 3 sets - 10 reps   ASSESSMENT:   CLINICAL IMPRESSION: Patient presents to PT reporting no current pain in her neck and L shoulder, but increased pain in her lower back today. She states that this pain is bothering her the most at this point and that her pain radiates to her sides and down BIL LE. Advised patient that we would need a new referral to add in her low back pain into her POC. Session today continued to focus on light resistance exercises for shoulders and pericapsular areas. Patient continues to benefit from skilled PT services and should be progressed as able to improve functional independence.    Per eval - Pt is a pleasant 68 year old woman who arrives to PT evaluation on this date for neck pain s/p ACDF 03/17/22. Pt reports difficulty with daily activities such as reaching, driving, and general housework due to pain/stiffness. During today's session pt demonstrates limitations as expected post op in regards to cervical mobility and  tenderness/tightness of periscapular/cervical musculature which are limiting ability to perform aforementioned activities. Deferred MMT given recency of surgery, advised pt to maintain ROM within pain free range as she states her precautions were liberalized but she was advised to avoid pain for now. No adverse events, pt tolerates HEP well with cues as above. Recommend skilled PT to address aforementioned deficits to improve functional independence/tolerance.    OBJECTIVE IMPAIRMENTS: decreased activity tolerance, decreased endurance, decreased ROM, decreased strength, impaired UE functional use, improper body mechanics, postural dysfunction, and pain.    ACTIVITY LIMITATIONS: carrying, lifting, bending, dressing, reach over head, and hygiene/grooming   PARTICIPATION LIMITATIONS: meal prep, cleaning, laundry, driving, and community activity   PERSONAL FACTORS: Age, Time since onset of injury/illness/exacerbation, and 3+ comorbidities: anxiety/depression, osteopenia, prediabetes  are also affecting patient's functional outcome.    REHAB POTENTIAL: Good   CLINICAL DECISION MAKING: Stable/uncomplicated   EVALUATION COMPLEXITY: Low     GOALS: Goals reviewed with patient? No   SHORT TERM GOALS: Target date: 06/29/2022   Pt will demonstrate appropriate understanding and performance of initially prescribed HEP in order to facilitate improved independence with management of symptoms.  Baseline: HEP provided on eval Goal status: INITIAL    2. Pt will score greater than or equal to 49 on FOTO in order to demonstrate improved perception of function due to symptoms.            Baseline: 44            Goal status: INITIAL     LONG TERM GOALS: Target date: 07/27/2022 Pt will score 53 on FOTO in order to demonstrate improved perception of function due to symptoms. Baseline: 44 Goal status: INITIAL   2. Pt will demonstrate at least 45 degrees of active cervical ROM in order to demonstrate improved  environmental awareness and safety with driving.  Baseline: see ROM chart above Goal status: INITIAL   3. Pt will demonstrate at least 4+/5 shoulder MMT for improved symmetry of UE strength and improved tolerance to functional movements.  Baseline: NT given recency of surgery Goal status: INITIAL    4. Pt will report at least 50% decrease in overall pain levels in past week in order to facilitate improved tolerance to basic ADLs/mobility.  Baseline: 0-10/10 reported in past week            Goal status: INITIAL     5. Pt will demonstrate appropriate performance of final prescribed HEP in order to facilitate improved self-management of symptoms post-discharge.             Baseline: initial HEP prescribed            Goal status: INITIAL       PLAN:   PT FREQUENCY: 1-2x/week (plan for 2x/week for 4 weeks, taper to 1x/week for 4 weeks)   PT DURATION: 8 weeks   PLANNED INTERVENTIONS: Therapeutic exercises, Therapeutic activity, Neuromuscular re-education, Balance training, Gait training, Patient/Family education, Self Care, Joint mobilization, Vestibular training, Aquatic Therapy, Dry Needling, Electrical stimulation, Spinal mobilization, Cryotherapy, Moist heat, Taping, Manual therapy, and Re-evaluation   PLAN FOR NEXT SESSION: review/update HEP PRN. Emphasis on postural mobility. Pain free cervical mobility. Manual PRN, would likely benefit from [facility] PTA 06/28/2022 12:08 PM

## 2022-07-08 ENCOUNTER — Telehealth: Payer: Self-pay

## 2022-07-08 DIAGNOSIS — G8929 Other chronic pain: Secondary | ICD-10-CM

## 2022-07-08 DIAGNOSIS — M5136 Other intervertebral disc degeneration, lumbar region: Secondary | ICD-10-CM

## 2022-07-08 NOTE — Addendum Note (Signed)
Addended by: Deatra James on: 07/08/2022 01:57 PM   Modules accepted: Orders

## 2022-07-08 NOTE — Telephone Encounter (Signed)
Pt is asking for Pt referral to tx lumbar spine before getting surgery.

## 2022-07-08 NOTE — Telephone Encounter (Signed)
Called pt inform her MD ok refer for PT. She is asking to call son Lawanna Kobus) since he can understand english. Called son no answer and can't leave msg due to vm full.Marland KitchenRaechel Chute

## 2022-07-08 NOTE — Telephone Encounter (Signed)
Okay to order PT referral.  Thanks.

## 2022-07-13 NOTE — Therapy (Signed)
OUTPATIENT PHYSICAL THERAPY TREATMENT NOTE   Patient Name: Christine Hawkins MRN: [id number] DOB:[birth year], 67 y.o., female Today's Date: 07/14/2022  PCP: Georgina Quint, MD   REFERRING PROVIDER: Clovis Riley, PA-C  END OF SESSION:   PT End of Session - 07/14/22 1449     Visit Number 7    Number of Visits 13    Date for PT Re-Evaluation 07/27/22    Authorization Type [employer]    PT Start Time 1445    PT Stop Time 1530    PT Time Calculation (min) 45 min    Activity Tolerance Patient tolerated treatment well;No increased pain    Behavior During Therapy WFL for tasks assessed/performed               Past Medical History:  Diagnosis Date   Anxiety    Colon polyps    Depression    GERD (gastroesophageal reflux disease)    High cholesterol    Hypertension    Osteopenia    PONV (postoperative nausea and vomiting)    Pre-diabetes    Past Surgical History:  Procedure Laterality Date   ANTERIOR CERVICAL DECOMP/DISCECTOMY FUSION N/A 03/17/2022   Procedure: Anterior Cervical Decompression Fusion Cervical three-four, removal CERVICAL PLATE;  Surgeon: Bedelia Person, MD;  Location: [facility] OR;  Service: Neurosurgery;  Laterality: N/A;   BREAST LUMPECTOMY Left    CERVICAL SPINE SURGERY      2013 has 8 screws    CESAREAN SECTION     COLONOSCOPY  08/26/2009   Normal colonoscopy up to cecum    LEFT HEART CATHETERIZATION WITH CORONARY ANGIOGRAM N/A 06/07/2012   Procedure: LEFT HEART CATHETERIZATION WITH CORONARY ANGIOGRAM;  Surgeon: Pamella Pert, MD;  Location: [facility] CATH LAB;  Service: Cardiovascular;  Laterality: N/A;   Patient Active Problem List   Diagnosis Date Noted   Stenosis of cervical spine with myelopathy (HCC) 03/17/2022   S/P cervical spinal fusion 03/17/2022   Degenerative disc disease, lumbar 11/25/2021   Osteopenia 09/01/2020   Depression 09/01/2020   Colon polyps 09/01/2020   Anxiety 09/01/2020   Trigger ring finger of right hand 07/07/2020    Chronic neck pain 11/07/2017   Bilateral carotid artery stenosis 05/11/2017   Insomnia due to other mental disorder 04/19/2016   Chronic bilateral low back pain without sciatica 09/14/2015   Prediabetes 04/02/2015   Hyperlipidemia LDL goal <130 04/02/2015   Gastroesophageal reflux disease without esophagitis 04/02/2015   Intrinsic eczema 04/02/2015   Recurrent major depressive disorder, in full remission (HCC) 04/02/2015   Carpal tunnel syndrome of right wrist 01/30/2015   Bilateral leg edema 01/30/2015   Status post cervical spinal arthrodesis 05/22/2011   Primary osteoarthritis involving multiple joints 12/01/2006   Dyslipidemia 05/12/2006   COLONIC POLYPS, BENIGN, HX OF 05/12/2006    REFERRING DIAG: M43.22 (ICD-10-CM) - Fusion of spine, cervical region   THERAPY DIAG:  Abnormal posture  Cervicalgia  Muscle weakness (generalized)  Rationale for Evaluation and Treatment Rehabilitation  PERTINENT HISTORY: carotid stenosis, GERD, osteopenia, multiple MSK issues including cervical fusion, prediabetes, anxiety/depression   PRECAUTIONS: Cervical (per pt, restrictions have been liberalized but was advised to avoid painful movement and lift no more than 10#)   SUBJECTIVE:  SUBJECTIVE STATEMENT:   Patient reporting no pain in her neck today. States that most of her pain is in her lower back and she is trying to reach her PCP to send a referral.    PAIN:  Are you having pain: 0/10 neck, 5/10 L shoulder Location/description: neck  Best-worst over past week: 0-10/10  - aggravating factors: bending forward, reaching forward, neck movement, driving - Easing factors: rest   OBJECTIVE: (objective measures completed at initial evaluation unless otherwise dated)   DIAGNOSTIC FINDINGS:  S/p ACDF 03/17/22    PATIENT SURVEYS:  FOTO 44 current, 53 predicted   COGNITION: Overall cognitive status: Within functional limits for tasks assessed   SENSATION: Does not endorse any sensory issues since surgery   POSTURE: guarded UT posture, otherwise WNL   PALPATION: Generalized tightness periscapular/cervical musculature BIL, concordant pain with palpation of L rhomboid/mid trap and LS          CERVICAL ROM:    Active ROM A/PROM (deg) eval 07/14/22  Flexion 25% stiffness   Extension     Right lateral flexion     Left lateral flexion     Right rotation 28 deg * 35 deg  Left rotation 18 deg * 25 deg   (Blank rows = not tested) (Key: WFL = within functional limits not formally assessed, * = concordant pain, s = stiffness/stretching sensation, NT = not tested)  Comments: transient discomfort with ROM as above   UPPER EXTREMITY ROM:   A/PROM Right eval Left eval  Shoulder flexion      Shoulder abduction      Shoulder internal rotation      Shoulder external rotation      Elbow flexion      Elbow extension      Wrist flexion      Wrist extension       (Blank rows = not tested) (Key: WFL = within functional limits not formally assessed, * = concordant pain, s = stiffness/stretching sensation, NT = not tested)  Comments: ~ >120 deg B UE grossly without pain    UPPER EXTREMITY MMT:   MMT Right eval Left eval  Shoulder flexion      Shoulder extension      Shoulder abduction      Shoulder extension      Shoulder internal rotation      Shoulder external rotation      Elbow flexion      Elbow extension      Grip strength      (Blank rows = not tested)  (Key: WFL = within functional limits not formally assessed, * = concordant pain, s = stiffness/stretching sensation, NT = not tested)  Comments:    CERVICAL SPECIAL TESTS:  Deferred given recency of surgery   FUNCTIONAL TESTS:  Limited overhead reach as above   TODAY'S TREATMENT:     [facility] Adult PT Treatment:                                                 DATE: 07/14/2022   Therapeutic Exercise: UBE 3' fwd/back, level 1  Fwd/retro shoulder rolls 2x10 each, cues for amplitude and comfortable ROM  UT stretch, 2 x 30 sec each side - small range d/t cervical fusion  Double ER + scapular retraction green TB 2x10 cues for posture High row (<90 deg )  green TB 2x15 Horizontal Abduction green TB 2x10  Manual Therapy:  STM to L UT, levator, scalene, supra/infraspinatus   [facility] Adult PT Treatment:                                                DATE: 06/28/22 Therapeutic Exercise: Double ER + scapular retraction red TB 2x10 cues for posture High row (<90 deg ) red TB 2x10 Horizontal Abduction red TB 2x10 UT stretch, 2 x 30 sec each side - small range d/t cervical fusion  Fwd/retro shoulder rolls 2x10 each, cues for amplitude and comfortable ROM  Hooklying LTR x10 BIL SKTC 2x30" BIL Manual Therapy:  STM to L UT, levator, scalene, supra/infraspinatus    [facility] Adult PT Treatment:                                                DATE: 06/25/2022 Light resistance only   Therapeutic Exercise: Swiss ball flexion and L diagonal seated x10 each  Double ER + scapular retraction red TB 2x8 cues for posture High row (<90 deg ) red TB 3x8  Horizontal Abduction red TB 2x8 UT stretch, 2 x 30 sec each side - small range d/t cervical fusion  HEP update + education on appropriate performance  Manual Therapy:  STM to L UT, levator, scalene, supra/infraspinatus                                                                                                                             PATIENT EDUCATION:  Education details: rationale for interventions, HEP  Person educated: Patient and son Education method: Explanation, Demonstration, Tactile cues, Verbal cues, and Handouts Education comprehension: verbalized understanding, returned demonstration, verbal cues required, tactile cues required, and needs further education     HOME  EXERCISE PROGRAM: Access Code: [email or web address] URL: [email or web address] Date: 06/16/2022 Prepared by: Fransisco Hertz  Exercises - Seated Scapular Retraction  - 1 x daily - 7 x weekly - 3 sets - 10 reps - Shoulder Rolls in Sitting  - 1 x daily - 7 x weekly - 2 sets - 10 reps - Seated Shoulder Scaption Slide at Table Top with Forearm in Neutral  - 1 x daily - 7 x weekly - 3 sets - 10 reps   ASSESSMENT:   CLINICAL IMPRESSION: Vonita is progressing well with her CS AROM and tolerance of postural strengthening activities. She continues to report that lumbar pain is more aggravating at this time and has minimal neck pain over the past few visits. Plan is to assess low back at next visit to include in POC per pending MD referral.   Per eval - Pt is a pleasant 67 year old  woman who arrives to PT evaluation on this date for neck pain s/p ACDF 03/17/22. Pt reports difficulty with daily activities such as reaching, driving, and general housework due to pain/stiffness. During today's session pt demonstrates limitations as expected post op in regards to cervical mobility and tenderness/tightness of periscapular/cervical musculature which are limiting ability to perform aforementioned activities. Deferred MMT given recency of surgery, advised pt to maintain ROM within pain free range as she states her precautions were liberalized but she was advised to avoid pain for now. No adverse events, pt tolerates HEP well with cues as above. Recommend skilled PT to address aforementioned deficits to improve functional independence/tolerance.    OBJECTIVE IMPAIRMENTS: decreased activity tolerance, decreased endurance, decreased ROM, decreased strength, impaired UE functional use, improper body mechanics, postural dysfunction, and pain.    ACTIVITY LIMITATIONS: carrying, lifting, bending, dressing, reach over head, and hygiene/grooming   PARTICIPATION LIMITATIONS: meal prep, cleaning, laundry, driving, and  community activity   PERSONAL FACTORS: Age, Time since onset of injury/illness/exacerbation, and 3+ comorbidities: anxiety/depression, osteopenia, prediabetes  are also affecting patient's functional outcome.    REHAB POTENTIAL: Good   CLINICAL DECISION MAKING: Stable/uncomplicated   EVALUATION COMPLEXITY: Low     GOALS: Goals reviewed with patient? No   SHORT TERM GOALS: Target date: 06/29/2022    Pt will demonstrate appropriate understanding and performance of initially prescribed HEP in order to facilitate improved independence with management of symptoms.  Baseline: HEP provided on eval Goal status: MET   2. Pt will score greater than or equal to 49 on FOTO in order to demonstrate improved perception of function due to symptoms.            Baseline: 44 07/14/22: 46            Goal status: PROGRESSING   LONG TERM GOALS: Target date: 07/27/2022 Pt will score 53 on FOTO in order to demonstrate improved perception of function due to symptoms. Baseline: 44 Goal status: INITIAL   2. Pt will demonstrate at least 45 degrees of active cervical ROM in order to demonstrate improved environmental awareness and safety with driving.  Baseline: see ROM chart above Goal status: INITIAL   3. Pt will demonstrate at least 4+/5 shoulder MMT for improved symmetry of UE strength and improved tolerance to functional movements.  Baseline: NT given recency of surgery Goal status: INITIAL    4. Pt will report at least 50% decrease in overall pain levels in past week in order to facilitate improved tolerance to basic ADLs/mobility.             Baseline: 0-10/10 reported in past week            Goal status: INITIAL     5. Pt will demonstrate appropriate performance of final prescribed HEP in order to facilitate improved self-management of symptoms post-discharge.             Baseline: initial HEP prescribed            Goal status: INITIAL       PLAN:   PT FREQUENCY: 1-2x/week (plan for 2x/week  for 4 weeks, taper to 1x/week for 4 weeks)   PT DURATION: 8 weeks   PLANNED INTERVENTIONS: Therapeutic exercises, Therapeutic activity, Neuromuscular re-education, Balance training, Gait training, Patient/Family education, Self Care, Joint mobilization, Vestibular training, Aquatic Therapy, Dry Needling, Electrical stimulation, Spinal mobilization, Cryotherapy, Moist heat, Taping, Manual therapy, and Re-evaluation   PLAN FOR NEXT SESSION: review/update HEP PRN. Emphasis on postural mobility.  Pain free cervical mobility. Manual PRN, would likely benefit from [facility]  Mauri Reading PT, DPT 07/14/2022 2:51 PM

## 2022-07-14 ENCOUNTER — Ambulatory Visit: Payer: 59 | Attending: Emergency Medicine

## 2022-07-14 DIAGNOSIS — M6281 Muscle weakness (generalized): Secondary | ICD-10-CM | POA: Diagnosis not present

## 2022-07-14 DIAGNOSIS — M542 Cervicalgia: Secondary | ICD-10-CM | POA: Insufficient documentation

## 2022-07-14 DIAGNOSIS — M5416 Radiculopathy, lumbar region: Secondary | ICD-10-CM | POA: Insufficient documentation

## 2022-07-14 DIAGNOSIS — R293 Abnormal posture: Secondary | ICD-10-CM | POA: Insufficient documentation

## 2022-07-14 NOTE — Therapy (Signed)
OUTPATIENT PHYSICAL THERAPY TREATMENT NOTE   Patient Name: Christine Hawkins MRN: [id number] DOB:1955-08-02, 67 y.o., female Today's Date: 07/16/2022  PCP: Georgina Quint, MD   REFERRING PROVIDER: Clovis Riley, PA-C  END OF SESSION:   PT End of Session - 07/16/22 0953     Visit Number 8    Number of Visits 13    Date for PT Re-Evaluation 07/27/22    Authorization Type U[employer]    PT Start Time 0950    PT Stop Time 1030    PT Time Calculation (min) 40 min                Past Medical History:  Diagnosis Date   Anxiety    Colon polyps    Depression    GERD (gastroesophageal reflux disease)    High cholesterol    Hypertension    Osteopenia    PONV (postoperative nausea and vomiting)    Pre-diabetes    Past Surgical History:  Procedure Laterality Date   ANTERIOR CERVICAL DECOMP/DISCECTOMY FUSION N/A 03/17/2022   Procedure: Anterior Cervical Decompression Fusion Cervical three-four, removal CERVICAL PLATE;  Surgeon: Bedelia Person, MD;  Location: [facility] OR;  Service: Neurosurgery;  Laterality: N/A;   BREAST LUMPECTOMY Left    CERVICAL SPINE SURGERY      2013 has 8 screws    CESAREAN SECTION     COLONOSCOPY  08/26/2009   Normal colonoscopy up to cecum    LEFT HEART CATHETERIZATION WITH CORONARY ANGIOGRAM N/A 06/07/2012   Procedure: LEFT HEART CATHETERIZATION WITH CORONARY ANGIOGRAM;  Surgeon: Pamella Pert, MD;  Location: [facility] CATH LAB;  Service: Cardiovascular;  Laterality: N/A;   Patient Active Problem List   Diagnosis Date Noted   Stenosis of cervical spine with myelopathy (HCC) 03/17/2022   S/P cervical spinal fusion 03/17/2022   Degenerative disc disease, lumbar 11/25/2021   Osteopenia 09/01/2020   Depression 09/01/2020   Colon polyps 09/01/2020   Anxiety 09/01/2020   Trigger ring finger of right hand 07/07/2020   Chronic neck pain 11/07/2017   Bilateral carotid artery stenosis 05/11/2017   Insomnia due to other mental disorder 04/19/2016    Chronic bilateral low back pain without sciatica 09/14/2015   Prediabetes 04/02/2015   Hyperlipidemia LDL goal <130 04/02/2015   Gastroesophageal reflux disease without esophagitis 04/02/2015   Intrinsic eczema 04/02/2015   Recurrent major depressive disorder, in full remission (HCC) 04/02/2015   Carpal tunnel syndrome of right wrist 01/30/2015   Bilateral leg edema 01/30/2015   Status post cervical spinal arthrodesis 05/22/2011   Primary osteoarthritis involving multiple joints 12/01/2006   Dyslipidemia 05/12/2006   COLONIC POLYPS, BENIGN, HX OF 05/12/2006    REFERRING DIAG: M43.22 (ICD-10-CM) - Fusion of spine, cervical region   THERAPY DIAG:  No diagnosis found.  Rationale for Evaluation and Treatment Rehabilitation  PERTINENT HISTORY: carotid stenosis, GERD, osteopenia, multiple MSK issues including cervical fusion, prediabetes, anxiety/depression   PRECAUTIONS: Cervical (per pt, restrictions have been liberalized but was advised to avoid painful movement and lift no more than 10#)   SUBJECTIVE:  SUBJECTIVE STATEMENT:   Patient reporting no pain in her neck today. States that most of her pain is in her lower back and she is trying to reach her PCP to send a referral.   *** Patient reports that her lower back pain began     PAIN:  Are you having pain: 0/10 neck, 5/10 L shoulder Location/description: neck  Best-worst over past week: 0-10/10  - aggravating factors: bending forward, reaching forward, neck movement, driving - Easing factors: rest  Low back pain, with bilateral  LE numbness and stabbing pain in her bilateral heel    OBJECTIVE: (objective measures completed at initial evaluation unless otherwise dated)   DIAGNOSTIC FINDINGS:  S/p ACDF 03/17/22   PATIENT SURVEYS:  FOTO 44  current, 53 predicted   COGNITION: Overall cognitive status: Within functional limits for tasks assessed   SENSATION: Does not endorse any sensory issues since surgery   POSTURE: guarded UT posture, otherwise WNL   PALPATION: Generalized tightness periscapular/cervical musculature BIL, concordant pain with palpation of L rhomboid/mid trap and LS          CERVICAL ROM:    Active ROM A/PROM (deg) eval 07/14/22  Flexion 25% stiffness   Extension     Right lateral flexion     Left lateral flexion     Right rotation 28 deg * 35 deg  Left rotation 18 deg * 25 deg   (Blank rows = not tested) (Key: WFL = within functional limits not formally assessed, * = concordant pain, s = stiffness/stretching sensation, NT = not tested)  Comments: transient discomfort with ROM as above   UPPER EXTREMITY ROM:   A/PROM Right eval Left eval  Shoulder flexion      Shoulder abduction      Shoulder internal rotation      Shoulder external rotation      Elbow flexion      Elbow extension      Wrist flexion      Wrist extension       (Blank rows = not tested) (Key: WFL = within functional limits not formally assessed, * = concordant pain, s = stiffness/stretching sensation, NT = not tested)  Comments: ~ >120 deg B UE grossly without pain    UPPER EXTREMITY MMT:   MMT Right eval Left eval  Shoulder flexion      Shoulder extension      Shoulder abduction      Shoulder extension      Shoulder internal rotation      Shoulder external rotation      Elbow flexion      Elbow extension      Grip strength      (Blank rows = not tested)  (Key: WFL = within functional limits not formally assessed, * = concordant pain, s = stiffness/stretching sensation, NT = not tested)  Comments:    CERVICAL SPECIAL TESTS:  Deferred given recency of surgery  LUMBAR ROM:   Active  A/PROM  eval  Flexion 40%  Extension 20%  Right lateral flexion 50%  Left lateral flexion 50%  Right rotation 15%  Left  rotation 30%   (Blank rows = not tested)LUMBAR ROM:    LOWER EXTREMITY MMT:    MMT Right eval Left eval  Hip flexion 4 4  Hip extension    Hip abduction 4- 4-  Hip adduction    Hip internal rotation    Hip external rotation    Knee flexion  Knee extension    Ankle dorsiflexion    Ankle plantarflexion    Ankle inversion    Ankle eversion     (Blank rows = not tested)    FUNCTIONAL TESTS:  Limited overhead reach as above   TODAY'S TREATMENT:     [facility] Adult PT Treatment:                                                DATE: 07/16/2022  Therapeutic Exercise: *** Manual Therapy: *** Neuromuscular re-ed: *** Therapeutic Activity: *** Modalities: *** Self Care: ***   [facility] Treatment:                                                DATE: 07/14/2022  Therapeutic Exercise: UBE 3' fwd/back, level 1  Fwd/retro shoulder rolls 2x10 each, cues for amplitude and comfortable ROM  UT stretch, 2 x 30 sec each side - small range d/t cervical fusion  Double ER + scapular retraction green TB 2x10 cues for posture High row (<90 deg ) green TB 2x15 Horizontal Abduction green TB 2x10  Manual Therapy:  STM to L UT, levator, scalene, supra/infraspinatus    [facility] Adult PT Treatment:                                                DATE: 06/28/22 Therapeutic Exercise: Double ER + scapular retraction red TB 2x10 cues for posture High row (<90 deg ) red TB 2x10 Horizontal Abduction red TB 2x10 UT stretch, 2 x 30 sec each side - small range d/t cervical fusion  Fwd/retro shoulder rolls 2x10 each, cues for amplitude and comfortable ROM  Hooklying LTR x10 BIL SKTC 2x30" BIL  Manual Therapy:  STM to L UT, levator, scalene, supra/infraspinatus                                                                                                                              PATIENT EDUCATION:  Education details: rationale for interventions, HEP  Person educated: Patient and  son Education method: Explanation, Demonstration, Tactile cues, Verbal cues, and Handouts Education comprehension: verbalized understanding, returned demonstration, verbal cues required, tactile cues required, and needs further education     HOME EXERCISE PROGRAM: Access Code: [email or web address] URL: [email or web address] Date: 06/16/2022 Prepared by: Fransisco Hertz  Exercises - Seated Scapular Retraction  - 1 x daily - 7 x weekly - 3 sets - 10 reps - Shoulder Rolls in Sitting  - 1 x daily - 7 x weekly - 2 sets - 10 reps -  Seated Shoulder Scaption Slide at Table Top with Forearm in Neutral  - 1 x daily - 7 x weekly - 3 sets - 10 reps   ASSESSMENT:   CLINICAL IMPRESSION:  Neck Reassessment:  Since initial evaluation, patient is demonstrating improved CS AROM ***  Lumbar Assessment:  ***     OBJECTIVE IMPAIRMENTS: decreased activity tolerance, decreased endurance, decreased ROM, decreased strength, impaired UE functional use, improper body mechanics, postural dysfunction, and pain.    ACTIVITY LIMITATIONS: carrying, lifting, bending, dressing, reach over head, and hygiene/grooming   PARTICIPATION LIMITATIONS: meal prep, cleaning, laundry, driving, and community activity   PERSONAL FACTORS: Age, Time since onset of injury/illness/exacerbation, and 3+ comorbidities: anxiety/depression, osteopenia, prediabetes  are also affecting patient's functional outcome.    REHAB POTENTIAL: Good   CLINICAL DECISION MAKING: Stable/uncomplicated   EVALUATION COMPLEXITY: Low     GOALS: Goals reviewed with patient? No   SHORT TERM GOALS: Target date: 06/29/2022 ***   Pt will demonstrate appropriate understanding and performance of initially prescribed HEP in order to facilitate improved independence with management of symptoms.  Baseline: HEP provided on eval Goal status: MET   2. Pt will score greater than or equal to 49 on FOTO in order to demonstrate improved perception of function  due to symptoms.            Baseline: 44 07/14/22: 46            Goal status: PROGRESSING   LONG TERM GOALS: Target date: 07/27/2022 *** Pt will score 53 on FOTO in order to demonstrate improved perception of function due to symptoms. Baseline: 44 Goal status: INITIAL   2. Pt will demonstrate at least 45 degrees of active cervical ROM in order to demonstrate improved environmental awareness and safety with driving.  Baseline: see ROM chart above Goal status: INITIAL   3. Pt will demonstrate at least 4+/5 shoulder MMT for improved symmetry of UE strength and improved tolerance to functional movements.  Baseline: NT given recency of surgery Goal status: INITIAL    4. Pt will report at least 50% decrease in overall pain levels in past week in order to facilitate improved tolerance to basic ADLs/mobility.             Baseline: 0-10/10 reported in past week            Goal status: INITIAL     5. Pt will demonstrate appropriate performance of final prescribed HEP in order to facilitate improved self-management of symptoms post-discharge.             Baseline: initial HEP prescribed            Goal status: INITIAL       PLAN:   PT FREQUENCY: ***   PT DURATION: ***   PLANNED INTERVENTIONS: Therapeutic exercises, Therapeutic activity, Neuromuscular re-education, Balance training, Gait training, Patient/Family education, Self Care, Joint mobilization, Vestibular training, Aquatic Therapy, Dry Needling, Electrical stimulation, Spinal mobilization, Cryotherapy, Moist heat, Taping, Manual therapy, and Re-evaluation   PLAN FOR NEXT SESSION: review/update HEP PRN. Emphasis on postural mobility. Pain free cervical mobility. Manual PRN, would likely benefit from [facility]  Mauri Reading PT, DPT 07/16/2022 10:08 AM

## 2022-07-16 ENCOUNTER — Ambulatory Visit: Payer: 59

## 2022-07-16 DIAGNOSIS — M542 Cervicalgia: Secondary | ICD-10-CM

## 2022-07-16 DIAGNOSIS — M6281 Muscle weakness (generalized): Secondary | ICD-10-CM

## 2022-07-16 DIAGNOSIS — M5416 Radiculopathy, lumbar region: Secondary | ICD-10-CM | POA: Diagnosis not present

## 2022-07-16 DIAGNOSIS — R293 Abnormal posture: Secondary | ICD-10-CM

## 2022-07-22 NOTE — Therapy (Signed)
OUTPATIENT PHYSICAL THERAPY TREATMENT NOTE   Patient Name: Christine Hawkins MRN: [id number] DOB:1955/04/02, 67 y.o., female Today's Date: 07/23/2022   END OF SESSION:   PT End of Session - 07/23/22 0951     Visit Number 9    Number of Visits 13    Date for PT Re-Evaluation 07/27/22    Authorization Type [employer]    PT Start Time 0950    PT Stop Time 1028    PT Time Calculation (min) 38 min    Activity Tolerance Patient tolerated treatment well;No increased pain    Behavior During Therapy WFL for tasks assessed/performed              Past Medical History:  Diagnosis Date   Anxiety    Colon polyps    Depression    GERD (gastroesophageal reflux disease)    High cholesterol    Hypertension    Osteopenia    PONV (postoperative nausea and vomiting)    Pre-diabetes    Past Surgical History:  Procedure Laterality Date   ANTERIOR CERVICAL DECOMP/DISCECTOMY FUSION N/A 03/17/2022   Procedure: Anterior Cervical Decompression Fusion Cervical three-four, removal CERVICAL PLATE;  Surgeon: Bedelia Person, MD;  Location: [facility] OR;  Service: Neurosurgery;  Laterality: N/A;   BREAST LUMPECTOMY Left    CERVICAL SPINE SURGERY      2013 has 8 screws    CESAREAN SECTION     COLONOSCOPY  08/26/2009   Normal colonoscopy up to cecum    LEFT HEART CATHETERIZATION WITH CORONARY ANGIOGRAM N/A 06/07/2012   Procedure: LEFT HEART CATHETERIZATION WITH CORONARY ANGIOGRAM;  Surgeon: Pamella Pert, MD;  Location: [facility] CATH LAB;  Service: Cardiovascular;  Laterality: N/A;   Patient Active Problem List   Diagnosis Date Noted   Stenosis of cervical spine with myelopathy (HCC) 03/17/2022   S/P cervical spinal fusion 03/17/2022   Degenerative disc disease, lumbar 11/25/2021   Osteopenia 09/01/2020   Depression 09/01/2020   Colon polyps 09/01/2020   Anxiety 09/01/2020   Trigger ring finger of right hand 07/07/2020   Chronic neck pain 11/07/2017   Bilateral carotid artery stenosis 05/11/2017    Insomnia due to other mental disorder 04/19/2016   Chronic bilateral low back pain without sciatica 09/14/2015   Prediabetes 04/02/2015   Hyperlipidemia LDL goal <130 04/02/2015   Gastroesophageal reflux disease without esophagitis 04/02/2015   Intrinsic eczema 04/02/2015   Recurrent major depressive disorder, in full remission (HCC) 04/02/2015   Carpal tunnel syndrome of right wrist 01/30/2015   Bilateral leg edema 01/30/2015   Status post cervical spinal arthrodesis 05/22/2011   Primary osteoarthritis involving multiple joints 12/01/2006   Dyslipidemia 05/12/2006   COLONIC POLYPS, BENIGN, HX OF 05/12/2006   PCP: Georgina Quint, MD   REFERRING PROVIDER: Clovis Riley, PA-C  REFERRING DIAG: [id number] (ICD-10-CM) - Fusion of spine, cervical region   THERAPY DIAG:  Radiculopathy, lumbar region  Muscle Weakness (generalized)  Cervicalgia  Abnormal Posture   Rationale for Evaluation and Treatment Rehabilitation  PERTINENT HISTORY: carotid stenosis, GERD, osteopenia, multiple MSK issues including cervical fusion, prediabetes, anxiety/depression   PRECAUTIONS: Cervical (per pt, restrictions have been liberalized but was advised to avoid painful movement and lift no more than 10#)    SUBJECTIVE:  SUBJECTIVE STATEMENT:   Interpretation services provided today via AMN throughout today's session. Gerlene Burdock #[id number]  Patient reports that her back pain has improved and continues to have no neck or shoulder pain. Rates LBP as 1/10 today.   PAIN:  Are you having pain: 0/10 neck, 0/10 L shoulder, 1/10 lower back Location/description: neck  Best-worst over past week: 0-10/10  - aggravating factors: bending forward, reaching forward, neck movement, driving - Easing factors: rest  Moderate pain in  Low back pain, with bilateral LE numbness and stabbing pain in her bilateral heel    OBJECTIVE: (objective measures completed at initial evaluation unless otherwise dated)   DIAGNOSTIC FINDINGS:  S/p ACDF 03/17/22   PATIENT SURVEYS:  FOTO 44 current, 53 predicted   COGNITION: Overall cognitive status: Within functional limits for tasks assessed   SENSATION: Does not endorse any sensory issues since surgery   POSTURE: guarded UT posture, otherwise WNL   PALPATION: Generalized tightness periscapular/cervical musculature BIL, concordant pain with palpation of L rhomboid/mid trap and LS          CERVICAL ROM:    Active ROM A/PROM (deg) eval 07/14/22  Flexion 25% stiffness   Extension     Right lateral flexion     Left lateral flexion     Right rotation 28 deg * 35 deg  Left rotation 18 deg * 25 deg   (Blank rows = not tested) (Key: WFL = within functional limits not formally assessed, * = concordant pain, s = stiffness/stretching sensation, NT = not tested)  Comments: transient discomfort with ROM as above   UPPER EXTREMITY ROM:   A/PROM Right eval Left eval  Shoulder flexion      Shoulder abduction      Shoulder internal rotation      Shoulder external rotation      Elbow flexion      Elbow extension      Wrist flexion      Wrist extension       (Blank rows = not tested) (Key: WFL = within functional limits not formally assessed, * = concordant pain, s = stiffness/stretching sensation, NT = not tested)  Comments: ~ >120 deg B UE grossly without pain    UPPER EXTREMITY MMT:   MMT Right eval Left eval  Shoulder flexion      Shoulder extension      Shoulder abduction      Shoulder extension      Shoulder internal rotation      Shoulder external rotation      Elbow flexion      Elbow extension      Grip strength      (Blank rows = not tested)  (Key: WFL = within functional limits not formally assessed, * = concordant pain, s = stiffness/stretching sensation,  NT = not tested)  Comments:    CERVICAL SPECIAL TESTS:  Deferred given recency of surgery  LUMBAR ROM:   Active  A/PROM  07/16/22  Flexion 40%  Extension 20%  Right lateral flexion 50%  Left lateral flexion 50%  Right rotation 15%  Left rotation 30%   (Blank rows = not tested)  LOWER EXTREMITY MMT:    MMT Right eval Left eval  Hip flexion 4 4  Hip extension    Hip abduction 4- 4-  Hip adduction    Hip internal rotation    Hip external rotation    Knee flexion    Knee extension    Ankle dorsiflexion  Ankle plantarflexion    Ankle inversion    Ankle eversion     (Blank rows = not tested)    FUNCTIONAL TESTS:  Limited overhead reach as above   TODAY'S TREATMENT:    [facility] Adult PT Treatment:                                                DATE: 07/22/22 Therapeutic Exercise: Nustep level 5 x  5 mins while gathering subjective info Standing hip abduction/extension 2x10 each BIL Supine pball push TA activation 5" hold x10 Bridges x1 (too painful) Supine clamshell GTB 2x10 Supine marching GTB 3x30" LTR x10 BIL Sidelying open books x10 BIL Seated pball roll outs fwd/lat x10 each  [facility] Adult PT Treatment:                                                DATE: 07/16/2022  Therapeutic Exercise: Seated ball roll, 3 ways x 15 with 3-5 sec hold  Abdominal bracing with marching for initial core stabilization  Supine LTR x 10 each side, 3 sec hold   Therapeutic Activity: Reassessment of cervical/UQ status, and initial assessment of lumbar/LQ  Updated and reviewed HEP, including ensuring management program to promote compliance and independence.    [facility] Adult PT Treatment:                                                DATE: 07/14/2022  Therapeutic Exercise: UBE 3' fwd/back, level 1  Fwd/retro shoulder rolls 2x10 each, cues for amplitude and comfortable ROM  UT stretch, 2 x 30 sec each side - small range d/t cervical fusion  Double ER + scapular retraction green TB 2x10  cues for posture High row (<90 deg ) green TB 2x15 Horizontal Abduction green TB 2x10  Manual Therapy:  STM to L UT, levator, scalene, supra/infraspinatus                                                                                                                             PATIENT EDUCATION:  Education details: rationale for interventions, HEP  Person educated: Patient and son Education method: Explanation, Demonstration, Tactile cues, Verbal cues, and Handouts Education comprehension: verbalized understanding, returned demonstration, verbal cues required, tactile cues required, and needs further education     HOME EXERCISE PROGRAM: Access Code: [email or web address] URL: [email or web address] Date: 07/23/2022 Prepared by: Berta Minor  Program Notes  por standing rows, no quiero usar bands, solomente sus brazos  Exercises - Shoulder Rolls in Sitting  - 1 x daily - 7 x  weekly - 2 sets - 10 reps - Standing Row with Resistance with Anchored Resistance at Chest Height Palms Down  - 1 x daily - 7 x weekly - 3 sets - 8 reps - Shoulder External Rotation and Scapular Retraction with Resistance  - 1 x daily - 7 x weekly - 2-3 sets - 10 reps - 3 sec hold - Supine Lower Trunk Rotation  - 1 x daily - 7 x weekly - 2 sets - 10 reps - 3 sec hold - Supine March  - 1 x daily - 7 x weekly - 2 sets - 10 reps - Seated Flexion Stretch  - 1 x daily - 7 x weekly - 2 sets - 10 reps - 3 sec hold - Sidelying Thoracic Rotation with Open Book  - 1 x daily - 7 x weekly - 2 sets - 10 reps  ASSESSMENT:   CLINICAL IMPRESSION: Patient presents to PT reporting no current neck or shoulder pain and improved lower back pain. Session today focused on core and proximal hip strengthening as well as lumbar mobility. She had a sharp increase in pain with bridges, terminated this exercise. Patient continues to benefit from skilled PT services and should be progressed as able to improve functional independence.      OBJECTIVE IMPAIRMENTS: decreased activity tolerance, decreased endurance, decreased ROM, decreased strength, impaired UE functional use, improper body mechanics, postural dysfunction, and pain.    ACTIVITY LIMITATIONS: carrying, lifting, bending, dressing, reach over head, and hygiene/grooming   PARTICIPATION LIMITATIONS: meal prep, cleaning, laundry, driving, and community activity   PERSONAL FACTORS: Age, Time since onset of injury/illness/exacerbation, and 3+ comorbidities: anxiety/depression, osteopenia, prediabetes  are also affecting patient's functional outcome.    REHAB POTENTIAL: Good   CLINICAL DECISION MAKING: Stable/uncomplicated   EVALUATION COMPLEXITY: Low     GOALS: Goals reviewed with patient? No   SHORT TERM GOALS: Target date: 06/29/2022    Pt will demonstrate appropriate understanding and performance of initially prescribed HEP in order to facilitate improved independence with management of symptoms.  Baseline: HEP provided on eval Goal status: MET   2. Pt will score greater than or equal to 49 on FOTO in order to demonstrate improved perception of function due to symptoms.            Baseline: 44 07/14/22: 46            Goal status: PROGRESSING   LONG TERM GOALS: Target date: 08/27/2022, updated as of 07/16/22   Pt will score 53 on FOTO (cervical) in order to demonstrate improved perception of function due to symptoms. Baseline: 44 Goal status: INITIAL   2. Pt will demonstrate at least 45 degrees of active cervical ROM in order to demonstrate improved environmental awareness and safety with driving.  Baseline: see ROM chart above Goal status: PROGRESSING   3. Pt will demonstrate at least 4+/5 shoulder MMT for improved symmetry of UE strength and improved tolerance to functional movements.  Baseline: NT given recency of surgery Goal status: ONGOING   4. Pt will report at least 50% decrease in overall pain levels in past week in order to facilitate  improved tolerance to basic ADLs/mobility.             Baseline: 0-10/10 reported in past week            Goal status: PROGRESSING   5. Pt will demonstrate appropriate performance of final prescribed HEP in order to facilitate improved self-management of symptoms post-discharge.  Baseline: initial HEP prescribed 07/16/22: home program updated             Goal status: PROGRESSING  6. Patient will demonstrate 60% or greater LS AROM in all direction with no more than 5/10 pain reported at end range   Baseline (07/16/22): see ROM chart above  Goal status: INITIAL   7. Patient will report ability to centralize and/or decrease LE radicular symptoms utilizing HEP at least 50% of the time.   Baseline: unable to self-modify symptoms  Goal status: INITIAL      PLAN:   PT FREQUENCY: 1-2x/week, updated as of 07/16/22   PT DURATION: 6 weeks, updated as of 07/16/22   PLANNED INTERVENTIONS: Therapeutic exercises, Therapeutic activity, Neuromuscular re-education, Balance training, Gait training, Patient/Family education, Self Care, Joint mobilization, Vestibular training, Aquatic Therapy, Dry Needling, Electrical stimulation, Spinal mobilization, Cryotherapy, Moist heat, Taping, Manual therapy, and Re-evaluation   PLAN FOR NEXT SESSION: begin lumbar spine mobility, core and LE strengthening program, continue with postural mm endurance training. Cervical mobility as needed. Promote independence with HEP for ongoing UQ maintenance.   Berta Minor PTA 07/23/2022 10:33 AM

## 2022-07-23 ENCOUNTER — Ambulatory Visit: Payer: 59

## 2022-07-23 DIAGNOSIS — M5416 Radiculopathy, lumbar region: Secondary | ICD-10-CM

## 2022-07-23 DIAGNOSIS — M542 Cervicalgia: Secondary | ICD-10-CM

## 2022-07-23 DIAGNOSIS — M6281 Muscle weakness (generalized): Secondary | ICD-10-CM

## 2022-07-23 DIAGNOSIS — R293 Abnormal posture: Secondary | ICD-10-CM

## 2022-07-26 ENCOUNTER — Telehealth: Payer: Self-pay

## 2022-07-26 ENCOUNTER — Ambulatory Visit: Payer: 59 | Admitting: Physical Therapy

## 2022-08-16 ENCOUNTER — Ambulatory Visit: Payer: 59 | Attending: Emergency Medicine

## 2022-08-16 DIAGNOSIS — M542 Cervicalgia: Secondary | ICD-10-CM

## 2022-08-16 DIAGNOSIS — R293 Abnormal posture: Secondary | ICD-10-CM

## 2022-08-16 DIAGNOSIS — M6281 Muscle weakness (generalized): Secondary | ICD-10-CM | POA: Diagnosis not present

## 2022-08-16 DIAGNOSIS — M5416 Radiculopathy, lumbar region: Secondary | ICD-10-CM

## 2022-08-16 NOTE — Therapy (Signed)
OUTPATIENT PHYSICAL THERAPY TREATMENT NOTE   Patient Name: Christine Hawkins MRN: [id number] DOB:07/25/1955, 67 y.o., female Today's Date: 08/16/2022   END OF SESSION:   PT End of Session - 08/16/22 1709     Visit Number 10    Number of Visits 13    Date for PT Re-Evaluation 08/27/22    Authorization Type [employer]    PT Start Time 1709    PT Stop Time 1743    PT Time Calculation (min) 34 min    Activity Tolerance Patient tolerated treatment well;No increased pain    Behavior During Therapy WFL for tasks assessed/performed               Past Medical History:  Diagnosis Date   Anxiety    Colon polyps    Depression    GERD (gastroesophageal reflux disease)    High cholesterol    Hypertension    Osteopenia    PONV (postoperative nausea and vomiting)    Pre-diabetes    Past Surgical History:  Procedure Laterality Date   ANTERIOR CERVICAL DECOMP/DISCECTOMY FUSION N/A 03/17/2022   Procedure: Anterior Cervical Decompression Fusion Cervical three-four, removal CERVICAL PLATE;  Surgeon: Bedelia Person, MD;  Location[facility];  Service: Neurosurgery;  Laterality: N/A;   BREAST LUMPECTOMY Left    CERVICAL SPINE SURGERY      2013 has 8 screws    CESAREAN SECTION     COLONOSCOPY  08/26/2009   Normal colonoscopy up to cecum    LEFT HEART CATHETERIZATION WITH CORONARY ANGIOGRAM N/A 06/07/2012   Procedure: LEFT HEART CATHETERIZATION WITH CORONARY ANGIOGRAM;  Surgeon: Pamella Pert, MD;  Location: [facility] CATH LAB;  Service: Cardiovascular;  Laterality: N/A;   Patient Active Problem List   Diagnosis Date Noted   Stenosis of cervical spine with myelopathy (HCC) 03/17/2022   S/P cervical spinal fusion 03/17/2022   Degenerative disc disease, lumbar 11/25/2021   Osteopenia 09/01/2020   Depression 09/01/2020   Colon polyps 09/01/2020   Anxiety 09/01/2020   Trigger ring finger of right hand 07/07/2020   Chronic neck pain 11/07/2017   Bilateral carotid artery stenosis 05/11/2017    Insomnia due to other mental disorder 04/19/2016   Chronic bilateral low back pain without sciatica 09/14/2015   Prediabetes 04/02/2015   Hyperlipidemia LDL goal <130 04/02/2015   Gastroesophageal reflux disease without esophagitis 04/02/2015   Intrinsic eczema 04/02/2015   Recurrent major depressive disorder, in full remission (HCC) 04/02/2015   Carpal tunnel syndrome of right wrist 01/30/2015   Bilateral leg edema 01/30/2015   Status post cervical spinal arthrodesis 05/22/2011   Primary osteoarthritis involving multiple joints 12/01/2006   Dyslipidemia 05/12/2006   COLONIC POLYPS, BENIGN, HX OF 05/12/2006   PCP: Georgina Quint, MD   REFERRING PROVIDER: Clovis Riley, PA-C  REFERRING DIAG: [phone] (ICD-10-CM) - Fusion of spine, cervical region   THERAPY DIAG:  Radiculopathy, lumbar region  Muscle Weakness (generalized)  Cervicalgia  Abnormal Posture   Rationale for Evaluation and Treatment Rehabilitation  PERTINENT HISTORY: carotid stenosis, GERD, osteopenia, multiple MSK issues including cervical fusion, prediabetes, anxiety/depression   PRECAUTIONS: Cervical (per pt, restrictions have been liberalized but was advised to avoid painful movement and lift no more than 10#)    SUBJECTIVE:  SUBJECTIVE STATEMENT:    Patient reports about 2/10 in neck and back today. She states that she has being keeping up with her exercises and is feeling better overall.   PAIN:  Are you having pain: 0/10 neck, 0/10 L shoulder, 1/10 lower back Location/description: neck  Best-worst over past week: 0-10/10  - aggravating factors: bending forward, reaching forward, neck movement, driving - Easing factors: rest  Moderate pain in Low back pain, with bilateral LE numbness and stabbing pain in her  bilateral heel    OBJECTIVE: (objective measures completed at initial evaluation unless otherwise dated)   DIAGNOSTIC FINDINGS:  S/p ACDF 03/17/22   PATIENT SURVEYS:  FOTO 44 current, 53 predicted   COGNITION: Overall cognitive status: Within functional limits for tasks assessed   SENSATION: Does not endorse any sensory issues since surgery   POSTURE: guarded UT posture, otherwise WNL   PALPATION: Generalized tightness periscapular/cervical musculature BIL, concordant pain with palpation of L rhomboid/mid trap and LS          CERVICAL ROM:    Active ROM A/PROM (deg) eval 07/14/22  Flexion 25% stiffness   Extension     Right lateral flexion     Left lateral flexion     Right rotation 28 deg * 35 deg  Left rotation 18 deg * 25 deg   (Blank rows = not tested) (Key: WFL = within functional limits not formally assessed, * = concordant pain, s = stiffness/stretching sensation, NT = not tested)  Comments: transient discomfort with ROM as above   UPPER EXTREMITY ROM:   A/PROM Right eval Left eval  Shoulder flexion      Shoulder abduction      Shoulder internal rotation      Shoulder external rotation      Elbow flexion      Elbow extension      Wrist flexion      Wrist extension       (Blank rows = not tested) (Key: WFL = within functional limits not formally assessed, * = concordant pain, s = stiffness/stretching sensation, NT = not tested)  Comments: ~ >120 deg B UE grossly without pain    UPPER EXTREMITY MMT:   MMT Right eval Left eval  Shoulder flexion      Shoulder extension      Shoulder abduction      Shoulder extension      Shoulder internal rotation      Shoulder external rotation      Elbow flexion      Elbow extension      Grip strength      (Blank rows = not tested)  (Key: WFL = within functional limits not formally assessed, * = concordant pain, s = stiffness/stretching sensation, NT = not tested)  Comments:    CERVICAL SPECIAL TESTS:   Deferred given recency of surgery  LUMBAR ROM:   Active  A/PROM  07/16/22  Flexion 40%  Extension 20%  Right lateral flexion 50%  Left lateral flexion 50%  Right rotation 15%  Left rotation 30%   (Blank rows = not tested)  LOWER EXTREMITY MMT:    MMT Right eval Left eval  Hip flexion 4 4  Hip extension    Hip abduction 4- 4-  Hip adduction    Hip internal rotation    Hip external rotation    Knee flexion    Knee extension    Ankle dorsiflexion    Ankle plantarflexion    Ankle  inversion    Ankle eversion     (Blank rows = not tested)    FUNCTIONAL TESTS:  Limited overhead reach as above   TODAY'S TREATMENT:     [facility] Adult PT Treatment:                                                DATE: 08/16/2022  Therapeutic Exercise: Nustep level 5 x  5 mins while gathering subjective info Standing hip abduction/extension 2x10 each BIL Supine pball push TA activation 5" hold x10 Supine clamshell GTB 2x15 Supine marching GTB 2 x 10 each LTR x1 min BIL  [facility] Adult PT Treatment:                                                DATE: 07/22/22 Therapeutic Exercise: Nustep level 5 x  5 mins while gathering subjective info Standing hip abduction/extension 2x10 each BIL Supine pball push TA activation 5" hold x10 Bridges x1 (too painful) Supine clamshell GTB 2x10 Supine marching GTB 3x30" LTR x10 BIL Sidelying open books x10 BIL Seated pball roll outs fwd/lat x10 each  [facility] Adult PT Treatment:                                                DATE: 07/16/2022  Therapeutic Exercise: Seated ball roll, 3 ways x 15 with 3-5 sec hold  Abdominal bracing with marching for initial core stabilization  Supine LTR x 10 each side, 3 sec hold   Therapeutic Activity: Reassessment of cervical/UQ status, and initial assessment of lumbar/LQ  Updated and reviewed HEP, including ensuring management program to promote compliance and independence.    [facility] Adult PT Treatment:                                                 DATE: 07/14/2022  Therapeutic Exercise: UBE 3' fwd/back, level 1  Fwd/retro shoulder rolls 2x10 each, cues for amplitude and comfortable ROM  UT stretch, 2 x 30 sec each side - small range d/t cervical fusion  Double ER + scapular retraction green TB 2x10 cues for posture High row (<90 deg ) green TB 2x15 Horizontal Abduction green TB 2x10  Manual Therapy:  STM to L UT, levator, scalene, supra/infraspinatus  PATIENT EDUCATION:  Education details: rationale for interventions, HEP  Person educated: Patient and son Education method: Explanation, Demonstration, Tactile cues, Verbal cues, and Handouts Education comprehension: verbalized understanding, returned demonstration, verbal cues required, tactile cues required, and needs further education     HOME EXERCISE PROGRAM: Access Code: [email or web address] URL: [email or web address] Date: 07/23/2022 Prepared by: Berta Minor  Program Notes  por standing rows, no quiero usar bands, solomente sus Agricultural engineer in Sitting  - 1 x daily - 7 x weekly - 2 sets - 10 reps - Standing Row with Resistance with Anchored Resistance at Chest Height Palms Down  - 1 x daily - 7 x weekly - 3 sets - 8 reps - Shoulder External Rotation and Scapular Retraction with Resistance  - 1 x daily - 7 x weekly - 2-3 sets - 10 reps - 3 sec hold - Supine Lower Trunk Rotation  - 1 x daily - 7 x weekly - 2 sets - 10 reps - 3 sec hold - Supine March  - 1 x daily - 7 x weekly - 2 sets - 10 reps - Seated Flexion Stretch  - 1 x daily - 7 x weekly - 2 sets - 10 reps - 3 sec hold - Sidelying Thoracic Rotation with Open Book  - 1 x daily - 7 x weekly - 2 sets - 10 reps  ASSESSMENT:   CLINICAL IMPRESSION: Comer Locket is doing very good with her exercises and is demonstrating improved core strength as well  as lumbar mobility. She will benefit from 1-2 additional visits within remaining POC to promote independence with HEP for ongoing maintenance. We prepare for discharge throughout remaining visits.      OBJECTIVE IMPAIRMENTS: decreased activity tolerance, decreased endurance, decreased ROM, decreased strength, impaired UE functional use, improper body mechanics, postural dysfunction, and pain.    ACTIVITY LIMITATIONS: carrying, lifting, bending, dressing, reach over head, and hygiene/grooming   PARTICIPATION LIMITATIONS: meal prep, cleaning, laundry, driving, and community activity   PERSONAL FACTORS: Age, Time since onset of injury/illness/exacerbation, and 3+ comorbidities: anxiety/depression, osteopenia, prediabetes  are also affecting patient's functional outcome.    REHAB POTENTIAL: Good   CLINICAL DECISION MAKING: Stable/uncomplicated   EVALUATION COMPLEXITY: Low     GOALS: Goals reviewed with patient? No   SHORT TERM GOALS: Target date: 06/29/2022    Pt will demonstrate appropriate understanding and performance of initially prescribed HEP in order to facilitate improved independence with management of symptoms.  Baseline: HEP provided on eval Goal status: MET   2. Pt will score greater than or equal to 49 on FOTO in order to demonstrate improved perception of function due to symptoms.            Baseline: 44 07/14/22: 46            Goal status: PROGRESSING   LONG TERM GOALS: Target date: 08/27/2022, updated as of 07/16/22   Pt will score 53 on FOTO (cervical) in order to demonstrate improved perception of function due to symptoms. Baseline: 44 Goal status: INITIAL   2. Pt will demonstrate at least 45 degrees of active cervical ROM in order to demonstrate improved environmental awareness and safety with driving.  Baseline: see ROM chart above Goal status: PROGRESSING   3. Pt will demonstrate at least 4+/5 shoulder MMT for improved symmetry of UE strength and improved  tolerance to functional movements.  Baseline: NT given recency of surgery Goal status: ONGOING   4. Pt will report at least  50% decrease in overall pain levels in past week in order to facilitate improved tolerance to basic ADLs/mobility.             Baseline: 0-10/10 reported in past week            Goal status: PROGRESSING   5. Pt will demonstrate appropriate performance of final prescribed HEP in order to facilitate improved self-management of symptoms post-discharge.             Baseline: initial HEP prescribed 07/16/22: home program updated             Goal status: PROGRESSING  6. Patient will demonstrate 60% or greater LS AROM in all direction with no more than 5/10 pain reported at end range   Baseline (07/16/22): see ROM chart above  Goal status: INITIAL   7. Patient will report ability to centralize and/or decrease LE radicular symptoms utilizing HEP at least 50% of the time.   Baseline: unable to self-modify symptoms  Goal status: INITIAL      PLAN:   PT FREQUENCY: 1-2x/week, updated as of 07/16/22   PT DURATION: 6 weeks, updated as of 07/16/22   PLANNED INTERVENTIONS: Therapeutic exercises, Therapeutic activity, Neuromuscular re-education, Balance training, Gait training, Patient/Family education, Self Care, Joint mobilization, Vestibular training, Aquatic Therapy, Dry Needling, Electrical stimulation, Spinal mobilization, Cryotherapy, Moist heat, Taping, Manual therapy, and Re-evaluation   PLAN FOR NEXT SESSION: begin lumbar spine mobility, core and LE strengthening program, continue with postural mm endurance training. Cervical mobility as needed. Promote independence with HEP for ongoing UQ maintenance.   Mauri Reading PT, DPT 08/16/2022 5:56 PM

## 2022-08-24 ENCOUNTER — Ambulatory Visit: Payer: 59

## 2022-08-31 DIAGNOSIS — M4322 Fusion of spine, cervical region: Secondary | ICD-10-CM | POA: Diagnosis not present

## 2022-09-07 ENCOUNTER — Ambulatory Visit: Payer: 59

## 2022-09-07 DIAGNOSIS — R293 Abnormal posture: Secondary | ICD-10-CM

## 2022-09-07 DIAGNOSIS — M5416 Radiculopathy, lumbar region: Secondary | ICD-10-CM | POA: Diagnosis not present

## 2022-09-07 DIAGNOSIS — M542 Cervicalgia: Secondary | ICD-10-CM | POA: Diagnosis not present

## 2022-09-07 DIAGNOSIS — M6281 Muscle weakness (generalized): Secondary | ICD-10-CM | POA: Diagnosis not present

## 2022-09-07 NOTE — Therapy (Signed)
PHYSICAL THERAPY DISCHARGE SUMMARY  Visits from Start of Care: 11  Current functional level related to goals / functional outcomes: See objective findings/assessment    Remaining deficits: See objective findings/assessment    Education / Equipment: See today's session/assessment    Patient agrees to discharge. Patient goals were met. Patient is being discharged due to meeting the stated rehab goals.   OUTPATIENT PHYSICAL THERAPY TREATMENT NOTE   Patient Name: Christine Hawkins MRN: [id number] DOB:Dec 01, 1955, 67 y.o., female Today's Date: 09/07/2022   END OF SESSION:   PT End of Session - 09/07/22 1523     Visit Number 11    Authorization Type [employer]    PT Start Time 1445    PT Stop Time 1521    PT Time Calculation (min) 36 min    Activity Tolerance Patient tolerated treatment well;No increased pain    Behavior During Therapy WFL for tasks assessed/performed                Past Medical History:  Diagnosis Date   Anxiety    Colon polyps    Depression    GERD (gastroesophageal reflux disease)    High cholesterol    Hypertension    Osteopenia    PONV (postoperative nausea and vomiting)    Pre-diabetes    Past Surgical History:  Procedure Laterality Date   ANTERIOR CERVICAL DECOMP/DISCECTOMY FUSION N/A 03/17/2022   Procedure: Anterior Cervical Decompression Fusion Cervical three-four, removal CERVICAL PLATE;  Surgeon: Bedelia Person, MD;  Location: [facility] OR;  Service: Neurosurgery;  Laterality: N/A;   BREAST LUMPECTOMY Left    CERVICAL SPINE SURGERY      2013 has 8 screws    CESAREAN SECTION     COLONOSCOPY  08/26/2009   Normal colonoscopy up to cecum    LEFT HEART CATHETERIZATION WITH CORONARY ANGIOGRAM N/A 06/07/2012   Procedure: LEFT HEART CATHETERIZATION WITH CORONARY ANGIOGRAM;  Surgeon: Pamella Pert, MD;  Location: [facility] CATH LAB;  Service: Cardiovascular;  Laterality: N/A;   Patient Active Problem List   Diagnosis Date Noted   Stenosis of cervical  spine with myelopathy (HCC) 03/17/2022   S/P cervical spinal fusion 03/17/2022   Degenerative disc disease, lumbar 11/25/2021   Osteopenia 09/01/2020   Depression 09/01/2020   Colon polyps 09/01/2020   Anxiety 09/01/2020   Trigger ring finger of right hand 07/07/2020   Chronic neck pain 11/07/2017   Bilateral carotid artery stenosis 05/11/2017   Insomnia due to other mental disorder 04/19/2016   Chronic bilateral low back pain without sciatica 09/14/2015   Prediabetes 04/02/2015   Hyperlipidemia LDL goal <130 04/02/2015   Gastroesophageal reflux disease without esophagitis 04/02/2015   Intrinsic eczema 04/02/2015   Recurrent major depressive disorder, in full remission (HCC) 04/02/2015   Carpal tunnel syndrome of right wrist 01/30/2015   Bilateral leg edema 01/30/2015   Status post cervical spinal arthrodesis 05/22/2011   Primary osteoarthritis involving multiple joints 12/01/2006   Dyslipidemia 05/12/2006   COLONIC POLYPS, BENIGN, HX OF 05/12/2006   PCP: Georgina Quint, MD   REFERRING PROVIDER: Clovis Riley, PA-C  REFERRING DIAG: [phone] (ICD-10-CM) - Fusion of spine, cervical region   THERAPY DIAG:  Radiculopathy, lumbar region  Muscle Weakness (generalized)  Cervicalgia  Abnormal Posture   Rationale for Evaluation and Treatment Rehabilitation  PERTINENT HISTORY: carotid stenosis, GERD, osteopenia, multiple MSK issues including cervical fusion, prediabetes, anxiety/depression   PRECAUTIONS: Cervical (per pt, restrictions have been liberalized but was advised to avoid painful movement and  lift no more than 10#)    SUBJECTIVE:                                                                                                                                                                                      SUBJECTIVE STATEMENT:    Patient reports that she has been doing much better. "I have been doing my exercises. I was able to go to church and pray  without as much back pain. I can't lift really heavy weights, but I'm able to sweep and mop the floors."   She is agreeable to discharge from skilled PT.    PAIN:  Are you having pain: 0/10 neck, 0/10 L shoulder, 1/10 lower back Location/description: neck  Best-worst over past week: 0-10/10  - aggravating factors: bending forward, reaching forward, neck movement, driving - Easing factors: rest  Moderate pain in Low back pain, with bilateral LE numbness and stabbing pain in her bilateral heel    OBJECTIVE: (objective measures completed at initial evaluation unless otherwise dated)   DIAGNOSTIC FINDINGS:  S/p ACDF 03/17/22   PATIENT SURVEYS:  FOTO 44 current, 53 predicted   COGNITION: Overall cognitive status: Within functional limits for tasks assessed   SENSATION: Does not endorse any sensory issues since surgery   POSTURE: guarded UT posture, otherwise WNL   PALPATION: Generalized tightness periscapular/cervical musculature BIL, concordant pain with palpation of L rhomboid/mid trap and LS          CERVICAL ROM:    Active ROM A/PROM (deg) eval 07/14/22  Flexion 25% stiffness   Extension     Right lateral flexion     Left lateral flexion     Right rotation 28 deg * 35 deg  Left rotation 18 deg * 25 deg   (Blank rows = not tested) (Key: WFL = within functional limits not formally assessed, * = concordant pain, s = stiffness/stretching sensation, NT = not tested)  Comments: transient discomfort with ROM as above   UPPER EXTREMITY ROM:   ~ >120 deg B UE grossly without pain     CERVICAL SPECIAL TESTS:  Deferred given recency of surgery  LUMBAR ROM:   Active  A/PROM  07/16/22 AROM 09/07/22  Flexion 40% 60%  Extension 20% 25%  Right lateral flexion 50% 60%  Left lateral flexion 50% 60%  Right rotation 15% 20%  Left rotation 30% 30%   (Blank rows = not tested)  LOWER EXTREMITY MMT:    MMT Right eval Left eval Right 09/07/22 Left 09/07/22  Hip flexion 4 4  4+ 4  Hip extension      Hip abduction 4- 4- 4 4  Hip adduction      Hip internal rotation      Hip external rotation      Knee flexion      Knee extension      Ankle dorsiflexion      Ankle plantarflexion      Ankle inversion      Ankle eversion       (Blank rows = not tested)    FUNCTIONAL TESTS:  Limited overhead reach as above   TODAY'S TREATMENT:     [facility] Adult PT Treatment:                                                DATE: 09/07/2022  Therapeutic Exercise: Nustep level 5 x 7 mins while gathering subjective info  Therapeutic Activity:  Reassessment of objective measures and subjective assessment regarding progress towards established goals and plan for independence with prescribed home program following discharged from PT                                                                                                                             PATIENT EDUCATION:  Education details: rationale for interventions, HEP  Person educated: Patient and son Education method: Explanation, Demonstration, Tactile cues, Verbal cues, and Handouts Education comprehension: verbalized understanding, returned demonstration, verbal cues required, tactile cues required, and needs further education     HOME EXERCISE PROGRAM: Access Code: [email or web address] URL: [email or web address] Date: 07/23/2022 Prepared by: Berta Minor  Program Notes  por standing rows, no quiero usar bands, solomente sus Agricultural engineer in Sitting  - 1 x daily - 7 x weekly - 2 sets - 10 reps - Standing Row with Resistance with Anchored Resistance at Chest Height Palms Down  - 1 x daily - 7 x weekly - 3 sets - 8 reps - Shoulder External Rotation and Scapular Retraction with Resistance  - 1 x daily - 7 x weekly - 2-3 sets - 10 reps - 3 sec hold - Supine Lower Trunk Rotation  - 1 x daily - 7 x weekly - 2 sets - 10 reps - 3 sec hold - Supine March  - 1 x daily - 7 x weekly - 2 sets -  10 reps - Seated Flexion Stretch  - 1 x daily - 7 x weekly - 2 sets - 10 reps - 3 sec hold - Sidelying Thoracic Rotation with Open Book  - 1 x daily - 7 x weekly - 2 sets - 10 reps  ASSESSMENT:   CLINICAL IMPRESSION: Eudelia has attended 11 total PT sessions and is demonstrating great progress overall with lumbar mobility, LE strength, and reports concurrent decreased in pain severity of neck and lower back. She has improved tolerance of normal daily activities including sweeping, mopping, and driving with modifications as appropriate.  She is being discharged from skilled PT today to remain independent with home exercise program for ongoing maintenance.     OBJECTIVE IMPAIRMENTS: decreased activity tolerance, decreased endurance, decreased ROM, decreased strength, impaired UE functional use, improper body mechanics, postural dysfunction, and pain.    ACTIVITY LIMITATIONS: carrying, lifting, bending, dressing, reach over head, and hygiene/grooming   PARTICIPATION LIMITATIONS: meal prep, cleaning, laundry, driving, and community activity   PERSONAL FACTORS: Age, Time since onset of injury/illness/exacerbation, and 3+ comorbidities: anxiety/depression, osteopenia, prediabetes  are also affecting patient's functional outcome.    REHAB POTENTIAL: Good   CLINICAL DECISION MAKING: Stable/uncomplicated   EVALUATION COMPLEXITY: Low     GOALS: Goals reviewed with patient? No   SHORT TERM GOALS: Target date: 06/29/2022    Pt will demonstrate appropriate understanding and performance of initially prescribed HEP in order to facilitate improved independence with management of symptoms.  Baseline: HEP provided on eval Goal status: MET   2. Pt will score greater than or equal to 49 on FOTO in order to demonstrate improved perception of function due to symptoms.            Baseline: 44 07/14/22: 46 09/07/22: 53            Goal status: MET   LONG TERM GOALS: Target date: 08/27/2022, updated as of  07/16/22   Pt will score 53 on FOTO (cervical) in order to demonstrate improved perception of function due to symptoms. Baseline: 44 09/07/22: 53 Goal status: MET   2. Pt will demonstrate at least 45 degrees of active cervical ROM in order to demonstrate improved environmental awareness and safety with driving.  Baseline: see ROM chart above Goal status: Partially MET  09/07/22: Pt unable to reach ROM d/t, but is able to drive safely for up to 30 minutes with increased thoracolumbar rotation to compensation for cervical ROM loss after fusion procedure.    3. Pt will demonstrate at least 4+/5 shoulder MMT for improved symmetry of UE strength and improved tolerance to functional movements.  Baseline: NT given recency of surgery Goal status: discontinued; never formally assessed    4. Pt will report at least 50% decrease in overall pain levels in past week in order to facilitate improved tolerance to basic ADLs/mobility.             Baseline: 0-10/10 reported in past week            Goal status: MET   5. Pt will demonstrate appropriate performance of final prescribed HEP in order to facilitate improved self-management of symptoms post-discharge.             Baseline: initial HEP prescribed 07/16/22: home program updated             Goal status:MET   6. Patient will demonstrate 60% or greater LS AROM in all direction with no more than 5/10 pain reported at end range   Baseline (07/16/22): see ROM chart above  Goal status: NOT met; however pt demonstrates improved LS AROM in all directions without significant exacerbation of symptoms.   7. Patient will report ability to centralize and/or decrease LE radicular symptoms utilizing HEP at least 50% of the time.   Baseline: unable to self-modify symptoms  Goal status: MET  All goals reviewed and updated as of 09/07/22.      PLAN:   PT FREQUENCY: 1-2x/week, updated as of 07/16/22   PT DURATION: 6 weeks, updated as of 07/16/22   PLANNED  INTERVENTIONS: Therapeutic exercises,  Therapeutic activity, Neuromuscular re-education, Balance training, Gait training, Patient/Family education, Self Care, Joint mobilization, Vestibular training, Aquatic Therapy, Dry Needling, Electrical stimulation, Spinal mobilization, Cryotherapy, Moist heat, Taping, Manual therapy, and Re-evaluation    Mauri Reading PT, DPT 09/07/2022 4:24 PM

## 2022-10-11 ENCOUNTER — Other Ambulatory Visit: Payer: Self-pay | Admitting: Emergency Medicine

## 2022-10-11 DIAGNOSIS — E785 Hyperlipidemia, unspecified: Secondary | ICD-10-CM

## 2022-10-18 ENCOUNTER — Encounter: Payer: Self-pay | Admitting: Emergency Medicine

## 2022-10-18 ENCOUNTER — Ambulatory Visit (INDEPENDENT_AMBULATORY_CARE_PROVIDER_SITE_OTHER): Payer: 59 | Admitting: Emergency Medicine

## 2022-10-18 VITALS — BP 112/80 | HR 51 | Temp 97.5°F | Ht 62.0 in | Wt 163.2 lb

## 2022-10-18 DIAGNOSIS — H109 Unspecified conjunctivitis: Secondary | ICD-10-CM | POA: Diagnosis not present

## 2022-10-18 DIAGNOSIS — Z23 Encounter for immunization: Secondary | ICD-10-CM | POA: Diagnosis not present

## 2022-10-18 MED ORDER — POLYMYXIN B-TRIMETHOPRIM 10000-0.1 UNIT/ML-% OP SOLN: 2.0000 [drp] | Freq: Four times a day (QID) | OPHTHALMIC | 1 refills | Status: AC

## 2022-10-18 NOTE — Assessment & Plan Note (Signed)
Clinically stable.  No red flag signs or symptoms. No visual problems. Acute infection.  Recommend to start Polytrim eyedrops 4 times a day for 5 days to both eyes. ED precautions given. Advised to contact the office if no better or worse during the next several days

## 2022-10-18 NOTE — Progress Notes (Signed)
Christine Hawkins 67 y.o.   Chief Complaint  Patient presents with   Acute Visit    Allergies, Patient states a boil in both eyes, with mild pain, and green pus when waking up    HISTORY OF PRESENT ILLNESS: Acute problem visit today. This is a 67 y.o. female complaining of purulent discharge from right eye along with itching and redness that started a couple days ago No other associated symptoms No other complaints or medical concerns today.  HPI   Prior to Admission medications   Medication Sig Start Date End Date Taking? Authorizing Provider  acetaminophen (TYLENOL) 500 MG tablet Take 500 mg by mouth every 6 (six) hours as needed.    [provider]  acetaminophen (TYLENOL) 650 MG CR tablet Take 650 mg by mouth every 8 (eight) hours as needed for pain.    [provider]  azelastine (OPTIVAR) 0.05 % ophthalmic solution Place 1 drop into both eyes daily. 01/27/20   [provider]  Bempedoic Acid-Ezetimibe (NEXLIZET) 180-10 MG TABS Take 1 tablet by mouth daily. 06/06/22   Georgeanna Lea, MD  Cholecalciferol (VITAMIN D-3 PO) Take 1 capsule by mouth daily.    [provider]  [employer] COENZYME Q10 PO Take 100 mg by mouth daily.    [provider]  docusate sodium (COLACE) 100 MG capsule Take 1 capsule (100 mg total) by mouth 2 (two) times daily. 03/17/22 03/17/23  Bedelia Person, MD  gabapentin (NEURONTIN) 100 MG capsule Take 1 capsule (100 mg total) by mouth at bedtime. Patient taking differently: Take 100 mg by mouth daily as needed (For pain). 10/19/21   Vivi Barrack, DPM  Ginger, Zingiber officinalis, (GINGER PO) Take 1 capsule by mouth daily.    [provider]  loratadine (CLARITIN) 10 MG tablet Take 10 mg by mouth daily.    [provider]  Multiple Vitamins-Minerals (CENTRUM SILVER 50+WOMEN) TABS Take 1 tablet by mouth daily. Unknown strength    [provider]  mupirocin ointment (BACTROBAN) 2 % Sig  to affected area twice a day x 7 days. 03/18/19   Georgina Quint, MD  rosuvastatin (CRESTOR) 40 MG tablet TAKE 1 TABLET BY MOUTH EVERY DAY 10/11/22   Georgina Quint, MD  triamcinolone (NASACORT) 55 MCG/ACT AERO nasal inhaler Place 2 sprays into the nose daily. 03/18/19   Georgina Quint, MD  VITAMIN E PO Take 1 tablet by mouth daily.    [provider]    Allergies  Allergen Reactions   Lactose Intolerance (Gi)    Other     Dish/laundry dish detergent and bleach-smell bothers patient and dust   Bupropion Nausea Only   Niaspan [Niacin] Rash    Itching    Patient Active Problem List   Diagnosis Date Noted   Stenosis of cervical spine with myelopathy (HCC) 03/17/2022   S/P cervical spinal fusion 03/17/2022   Degenerative disc disease, lumbar 11/25/2021   Osteopenia 09/01/2020   Depression 09/01/2020   Colon polyps 09/01/2020   Anxiety 09/01/2020   Trigger ring finger of right hand 07/07/2020   Chronic neck pain 11/07/2017   Bilateral carotid artery stenosis 05/11/2017   Insomnia due to other mental disorder 04/19/2016   Chronic bilateral low back pain without sciatica 09/14/2015   Prediabetes 04/02/2015   Hyperlipidemia LDL goal <130 04/02/2015   Gastroesophageal reflux disease without esophagitis 04/02/2015   Intrinsic eczema 04/02/2015   Recurrent major depressive disorder, in full remission (HCC) 04/02/2015  Carpal tunnel syndrome of right wrist 01/30/2015   Bilateral leg edema 01/30/2015   Status post cervical spinal arthrodesis 05/22/2011   Primary osteoarthritis involving multiple joints 12/01/2006   Dyslipidemia 05/12/2006   COLONIC POLYPS, BENIGN, HX OF 05/12/2006    Past Medical History:  Diagnosis Date   Anxiety    Colon polyps    Depression    GERD (gastroesophageal reflux disease)    High cholesterol    Hypertension    Osteopenia    PONV (postoperative nausea and vomiting)    Pre-diabetes     Past Surgical History:  Procedure  Laterality Date   ANTERIOR CERVICAL DECOMP/DISCECTOMY FUSION N/A 03/17/2022   Procedure: Anterior Cervical Decompression Fusion Cervical three-four, removal CERVICAL PLATE;  Surgeon: Bedelia Person, MD;  Location: [facility] OR;  Service: Neurosurgery;  Laterality: N/A;   BREAST LUMPECTOMY Left    CERVICAL SPINE SURGERY      2013 has 8 screws    CESAREAN SECTION     COLONOSCOPY  08/26/2009   Normal colonoscopy up to cecum    LEFT HEART CATHETERIZATION WITH CORONARY ANGIOGRAM N/A 06/07/2012   Procedure: LEFT HEART CATHETERIZATION WITH CORONARY ANGIOGRAM;  Surgeon: Pamella Pert, MD;  Location: [facility] CATH LAB;  Service: Cardiovascular;  Laterality: N/A;    Social History   Socioeconomic History   Marital status: Legally Separated    Spouse name: Not on file   Number of children: 2   Years of education: Not on file   Highest education level: Not on file  Occupational History   Occupation: retired  Tobacco Use   Smoking status: Never   Smokeless tobacco: Never  Vaping Use   Vaping status: Never Used  Substance and Sexual Activity   Alcohol use: Not Currently   Drug use: Not Currently   Sexual activity: Not Currently  Other Topics Concern   Not on file  Social History Narrative   ** Merged History Encounter **       Social Determinants of Health   Financial Resource Strain: Not on file  Food Insecurity: Not on file  Transportation Needs: Not on file  Physical Activity: Not on file  Stress: Not on file  Social Connections: Not on file  Intimate Partner Violence: Not on file    Family History  Problem Relation Age of Onset   Heart disease Mother    Heart disease Father    Colon polyps Father    Kidney disease Maternal Grandmother    Stomach cancer Paternal Grandmother    Diabetes Paternal Grandmother    Esophageal cancer Neg Hx    Colon cancer Neg Hx    Rectal cancer Neg Hx      Review of Systems  Constitutional: Negative.  Negative for chills and fever.  HENT:  Negative.  Negative for congestion and sore throat.   Eyes:  Positive for discharge and redness. Negative for blurred vision, double vision and pain.  Respiratory: Negative.  Negative for cough and shortness of breath.   Cardiovascular:  Negative for chest pain.  Gastrointestinal:  Negative for abdominal pain, diarrhea, nausea and vomiting.  Skin: Negative.  Negative for rash.  Neurological: Negative.  Negative for dizziness and headaches.  All other systems reviewed and are negative.   Today's Vitals   10/18/22 1312  BP: 112/80  Pulse: (!) 51  Temp: (!) 97.5 F (36.4 C)  TempSrc: Oral  SpO2: 98%  Weight: 163 lb 3.2 oz (74 kg)  Height: 5\' 2"  (1.575 m)  Body mass index is 29.85 kg/m.   Physical Exam Vitals reviewed.  Constitutional:      Appearance: Normal appearance.  HENT:     Head: Normocephalic.     Mouth/Throat:     Mouth: Mucous membranes are moist.     Pharynx: Oropharynx is clear.  Eyes:     General: Lids are normal.     Extraocular Movements: Extraocular movements intact.     Conjunctiva/sclera:     Right eye: Right conjunctiva is injected. Exudate present.     Left eye: Left conjunctiva is not injected. No exudate.    Pupils: Pupils are equal, round, and reactive to light.  Cardiovascular:     Rate and Rhythm: Normal rate.  Pulmonary:     Effort: Pulmonary effort is normal.  Skin:    General: Skin is warm and dry.  Neurological:     Mental Status: She is alert and oriented to person, place, and time.  Psychiatric:        Mood and Affect: Mood normal.        Behavior: Behavior normal.      ASSESSMENT & PLAN: A total of 33 minutes was spent with the patient and counseling/coordination of care regarding preparing for this visit, review of most recent office visit notes, review of chronic medical conditions under management, review of all medications, diagnosis of bacterial conjunctivitis and need for topical antibiotics, prognosis, ED precautions,  documentation and need for follow-up if no better or worse during the next several days.   Problem List Items Addressed This Visit       Other   Bacterial conjunctivitis - Primary    Clinically stable.  No red flag signs or symptoms. No visual problems. Acute infection.  Recommend to start Polytrim eyedrops 4 times a day for 5 days to both eyes. ED precautions given. Advised to contact the office if no better or worse during the next several days      Relevant Medications   trimethoprim-polymyxin b (POLYTRIM) ophthalmic solution   Other Visit Diagnoses     Need for vaccination       Relevant Orders   Flu Vaccine Trivalent High Dose (Fluad) (Completed)      Patient Instructions  Conjuntivitis bacteriana en los adultos Bacterial Conjunctivitis, Adult La conjuntivitis bacteriana es una infeccin de la conjuntiva. Es la membrana transparente que recubre la parte blanca del ojo y la parte interna del prpado. Esta infeccin puede causar lo siguiente en los ojos: Color rojo o rosado. Picazn o irritacin. Esta afeccin se transmite fcilmente de [place] persona a otra (es contagiosa) y de un ojo al otro. Cules son las causas? La causa de esta afeccin son los grmenes (bacterias). Puede contraer la infeccin si entra en contacto estrecho con: Una persona que tiene la infeccin. Elementos que tienen grmenes (estn contaminados), como toallas para la cara, solucin para lentes de contacto o maquillaje para ojos. Qu incrementa el riesgo? Es ms probable que tenga esta afeccin si: Tiene contacto con personas que tienen la infeccin. [place] lentes de contacto. Tiene sinusitis. Ha tenido una lesin o [place] reciente en el ojo. Tiene debilitado el sistema de defensa del organismo (sistema inmunitario). Tiene los ojos secos. Cules son los signos o sntomas?  Secrecin espesa y Social worker del ojo. Lagrimeo u ojos llorosos. Picazn en los ojos. Sensacin de ardor en los  ojos. Ojos rojos. Hinchazn de los prpados. Visin borrosa. Cmo se trata?  Gotas para los ojos o ungento con antibitico. Antibiticos  que se toman por va oral. Estos se utilizan para infecciones que no mejoran con gotas o ungentos, o que duran ms de [address]. Paos hmedos fros [employer]. Lgrimas artificiales aplicadas de 2 a 6 veces por da. Siga estas instrucciones en su casa: Medicamentos [place] o aplique el antibitico como se lo haya indicado el mdico. No deje de usarlo aunque comience a sentirse mejor. [place] o [employer] medicamentos de venta libre y los recetados solamente como se lo haya indicado el mdico. No toque el prpado con el frasco de gotas para los ojos o el tubo de [place]. Control de las molestias Lmpiese el lquido del ojo con un pao hmedo y tibio o con una torunda de algodn. Colquese un pao limpio, hmedo y fro [place]. Haga esto durante 10 a 20 minutos, de 3 a 4 veces al da. Instrucciones generales No use lentes de contacto hasta que la infeccin haya desaparecido. Use anteojos hasta que el mdico lo autorice a ponerse lentes de contacto nuevamente. No use maquillaje en los ojos hasta que la infeccin haya desaparecido. Deseche el maquillaje para ojos viejo. Cambie o lave su almohada todos los [place]. No comparta las toallas o los paos. Lvese las manos con agua y jabn con frecuencia durante al menos 20 segundos y especialmente antes de tocarse la cara o los ojos. Use toallas de papel para secarse las manos. No se toque ni se frote los ojos. No conduzca ni use maquinaria pesada si su visin es borrosa. Comunquese con un mdico si: Tiene fiebre. No mejora luego de 10 das. Solicite ayuda de inmediato si: Tiene fiebre y los sntomas empeoran de [place] sbita. Siente dolor muy intenso cuando mueve el ojo. El rostro: Le duele. Est enrojecido. Est hinchado. Pierde la visin repentinamente. Resumen La conjuntivitis bacteriana es una  infeccin de la conjuntiva. Esta infeccin se contagia fcilmente de [place] Dear persona a [place]. Lvese las manos con agua y jabn con frecuencia durante al menos 20 segundos y especialmente antes de tocarse la cara o los ojos. Use toallas de papel para secarse las manos. Tome o aplique el antibitico como se lo haya indicado el mdico. Comunquese con un mdico si tiene fiebre o no mejora despus de [address]. Esta informacin no tiene Theme park manager el consejo del mdico. Asegrese de hacerle al mdico cualquier pregunta que tenga. Document Revised: 05/28/2020 Document Reviewed: 05/28/2020 Elsevier Patient Education  2024 Elsevier Inc.    Edwina Barth, MD [facility]

## 2022-10-18 NOTE — Patient Instructions (Signed)
Conjuntivitis bacteriana en los adultos Bacterial Conjunctivitis, Adult La conjuntivitis bacteriana es una infeccin de la conjuntiva. Es la membrana transparente que recubre la parte blanca del ojo y la parte interna del prpado. Esta infeccin puede causar lo siguiente en los ojos: Color rojo o rosado. Picazn o irritacin. Esta afeccin se transmite fcilmente de [place] persona a otra (es contagiosa) y de un ojo al otro. Cules son las causas? La causa de esta afeccin son los grmenes (bacterias). Puede contraer la infeccin si entra en contacto estrecho con: Una persona que tiene la infeccin. Elementos que tienen grmenes (estn contaminados), como toallas para la cara, solucin para lentes de contacto o maquillaje para ojos. Qu incrementa el riesgo? Es ms probable que tenga esta afeccin si: Tiene contacto con personas que tienen la infeccin. [place] lentes de contacto. Tiene sinusitis. Ha tenido una lesin o [place] reciente en el ojo. Tiene debilitado el sistema de defensa del organismo (sistema inmunitario). Tiene los ojos secos. Cules son los signos o sntomas?  Secrecin espesa y Social worker del ojo. Lagrimeo u ojos llorosos. Picazn en los ojos. Sensacin de ardor en los ojos. Ojos rojos. Hinchazn de los prpados. Visin borrosa. Cmo se trata?  Gotas para los ojos o ungento con antibitico. Antibiticos que se toman por va oral. Estos se utilizan para infecciones que no mejoran con gotas o ungentos, o que duran ms de [address]. Paos hmedos fros [employer]. Lgrimas artificiales aplicadas de 2 a 6 veces por da. Siga estas instrucciones en su casa: Medicamentos [place] o aplique el antibitico como se lo haya indicado el mdico. No deje de usarlo aunque comience a sentirse mejor. Tome o [employer] medicamentos de venta libre y los recetados solamente como se lo haya indicado el mdico. No toque el prpado con el frasco de gotas para los ojos o el tubo de  [place]. Control de las molestias Lmpiese el lquido del ojo con un pao hmedo y tibio o con una torunda de algodn. Colquese un pao limpio, hmedo y fro [place]. Haga esto durante 10 a 20 minutos, de 3 a 4 veces al da. Instrucciones generales No use lentes de contacto hasta que la infeccin haya desaparecido. Use anteojos hasta que el mdico lo autorice a ponerse lentes de contacto nuevamente. No use maquillaje en los ojos hasta que la infeccin haya desaparecido. Deseche el maquillaje para ojos viejo. [place] o lave su almohada todos los [place]. No comparta las toallas o los paos. Lvese las manos con agua y jabn con frecuencia durante al menos 20 segundos y especialmente antes de tocarse la cara o los ojos. Use toallas de papel para secarse las manos. No se toque ni se frote los ojos. No conduzca ni use maquinaria pesada si su visin es borrosa. Comunquese con un mdico si: Tiene fiebre. No mejora luego de 10 das. Solicite ayuda de inmediato si: Tiene fiebre y los sntomas empeoran de [place] sbita. Siente dolor muy intenso cuando mueve el ojo. El rostro: Le duele. Est enrojecido. Est hinchado. Pierde la visin repentinamente. Resumen La conjuntivitis bacteriana es una infeccin de la conjuntiva. Esta infeccin se contagia fcilmente de [place]om[place] Dear persona a [place]. Lvese las manos con agua y jabn con frecuencia durante al menos 20 segundos y especialmente antes de tocarse la cara o los ojos. Use toallas de papel para secarse las manos. Tome o aplique el antibitico como se lo haya indicado el mdico. Comunquese con un mdico si tiene fiebre o no mejora despus de  10 das. Esta informacin no tiene Theme park manager el consejo del mdico. Asegrese de hacerle al mdico cualquier pregunta que tenga. Document Revised: 05/28/2020 Document Reviewed: 05/28/2020 Elsevier Patient Education  2024 [employer].

## 2022-10-28 ENCOUNTER — Ambulatory Visit (INDEPENDENT_AMBULATORY_CARE_PROVIDER_SITE_OTHER): Payer: 59

## 2022-10-28 VITALS — BP 124/80 | HR 56 | Ht 62.5 in | Wt 164.2 lb

## 2022-10-28 DIAGNOSIS — Z0000 Encounter for general adult medical examination without abnormal findings: Secondary | ICD-10-CM | POA: Diagnosis not present

## 2022-10-28 NOTE — Progress Notes (Signed)
Subjective:   Christine Hawkins is a 67 y.o. female who presents for Medicare Annual (Subsequent) preventive examination.  Visit Complete: In person  Cardiac Risk Factors include: advanced age (>6men, >60 women);dyslipidemia;Other (see comment), Risk factor comments: Cartoid artery stenosis, Osteopenia     Objective:    Today's Vitals   10/28/22 1037  BP: 124/80  Pulse: (!) 56  SpO2: 100%  Weight: 164 lb 3.2 oz (74.5 kg)  Height: 5' 2.5" (1.588 m)  PainSc: 5    Body mass index is 29.55 kg/m.     10/28/2022   10:47 AM 06/01/2022    2:20 PM 03/17/2022    6:34 AM 03/11/2022    3:40 PM 12/17/2018    6:55 PM 06/07/2012    6:02 AM  Advanced Directives  Does Patient Have a Medical Advance Directive? No Yes No Yes No Patient has advance directive, copy in chart  Type of Advance Directive    Healthcare Power of Attorney    Does patient want to make changes to medical advance directive?  No - Patient declined      Copy of Healthcare Power of Attorney in Chart?    No - copy requested    Would patient like information on creating a medical advance directive?     No - Patient declined     Current Medications (verified) Outpatient Encounter Medications as of 10/28/2022  Medication Sig   acetaminophen (TYLENOL) 500 MG tablet Take 500 mg by mouth every 6 (six) hours as needed.   acetaminophen (TYLENOL) 650 MG CR tablet Take 650 mg by mouth every 8 (eight) hours as needed for pain.   azelastine (OPTIVAR) 0.05 % ophthalmic solution Place 1 drop into both eyes daily.   Bempedoic Acid-Ezetimibe (NEXLIZET) 180-10 MG TABS Take 1 tablet by mouth daily.   Cholecalciferol (VITAMIN D-3 PO) Take 1 capsule by mouth daily.   [employer] COENZYME Q10 PO Take 100 mg by mouth daily.   docusate sodium (COLACE) 100 MG capsule Take 1 capsule (100 mg total) by mouth 2 (two) times daily.   gabapentin (NEURONTIN) 100 MG capsule Take 1 capsule (100 mg total) by mouth at bedtime. (Patient taking differently: Take 100  mg by mouth daily as needed (For pain).)   Ginger, Zingiber officinalis, (GINGER PO) Take 1 capsule by mouth daily.   loratadine (CLARITIN) 10 MG tablet Take 10 mg by mouth daily.   Multiple Vitamins-Minerals (CENTRUM SILVER 50+WOMEN) TABS Take 1 tablet by mouth daily. Unknown strength   mupirocin ointment (BACTROBAN) 2 % Sig to affected area twice a day x 7 days.   rosuvastatin (CRESTOR) 40 MG tablet TAKE 1 TABLET BY MOUTH EVERY DAY   triamcinolone (NASACORT) 55 MCG/ACT AERO nasal inhaler Place 2 sprays into the nose daily.   VITAMIN E PO Take 1 tablet by mouth daily.   No facility-administered encounter medications on file as of 10/28/2022.    Allergies (verified) Lactose intolerance (gi), Other, Bupropion, and Niaspan [niacin]   History: Past Medical History:  Diagnosis Date   Anxiety    Colon polyps    Depression    GERD (gastroesophageal reflux disease)    High cholesterol    Hypertension    Osteopenia    PONV (postoperative nausea and vomiting)    Pre-diabetes    Past Surgical History:  Procedure Laterality Date   ANTERIOR CERVICAL DECOMP/DISCECTOMY FUSION N/A 03/17/2022   Procedure: Anterior Cervical Decompression Fusion Cervical three-four, removal CERVICAL PLATE;  Surgeon: Bedelia Person, MD;  Location: [facility] OR;  Service: Neurosurgery;  Laterality: N/A;   BREAST LUMPECTOMY Left    CERVICAL SPINE SURGERY      2013 has 8 screws    CESAREAN SECTION     COLONOSCOPY  08/26/2009   Normal colonoscopy up to cecum    LEFT HEART CATHETERIZATION WITH CORONARY ANGIOGRAM N/A 06/07/2012   Procedure: LEFT HEART CATHETERIZATION WITH CORONARY ANGIOGRAM;  Surgeon: Pamella Pert, MD;  Location: [facility] CATH LAB;  Service: Cardiovascular;  Laterality: N/A;   Family History  Problem Relation Age of Onset   Heart disease Mother    Heart disease Father    Colon polyps Father    Kidney disease Maternal Grandmother    Stomach cancer Paternal Grandmother    Diabetes Paternal Grandmother     Esophageal cancer Neg Hx    Colon cancer Neg Hx    Rectal cancer Neg Hx    Social History   Socioeconomic History   Marital status: Legally Separated    Spouse name: Not on file   Number of children: 2   Years of education: Not on file   Highest education level: Not on file  Occupational History   Occupation: retired  Tobacco Use   Smoking status: Never   Smokeless tobacco: Never  Vaping Use   Vaping status: Never Used  Substance and Sexual Activity   Alcohol use: Not Currently   Drug use: Not Currently   Sexual activity: Not Currently  Other Topics Concern   Not on file  Social History Narrative   ** Merged History Encounter **       Social Determinants of Health   Financial Resource Strain: Low Risk  (10/28/2022)   Overall Financial Resource Strain (CARDIA)    Difficulty of Paying Living Expenses: Not hard at all  Food Insecurity: No Food Insecurity (10/28/2022)   Hunger Vital Sign    Worried About Running Out of Food in the Last Year: Never true    Ran Out of Food in the Last Year: Never true  Transportation Needs: No Transportation Needs (10/28/2022)   PRAPARE - Administrator, Civil Service (Medical): No    Lack of Transportation (Non-Medical): No  Physical Activity: Insufficiently Active (10/28/2022)   Exercise Vital Sign    Days of Exercise per Week: 7 days    Minutes of Exercise per Session: 20 min  Stress: No Stress Concern Present (10/28/2022)   [employer] of Occupational Health - Occupational Stress Questionnaire    Feeling of Stress : Not at all  Social Connections: Moderately Integrated (10/28/2022)   Social Connection and Isolation Panel [NHANES]    Frequency of Communication with Friends and Family: More than three times a week    Frequency of Social Gatherings with Friends and Family: Not on file    Attends Religious Services: More than 4 times per year    Active Member of [employer] or Organizations: Yes    Attends Hospital doctor: More than 4 times per year    Marital Status: Separated    Tobacco Counseling Counseling given: Not Answered   Clinical Intake:  Pre-visit preparation completed: Yes  Pain : 0-10 Pain Score: 5  Pain Type: Acute pain Pain Location: Back (lumber) Pain Descriptors / Indicators: Aching, Constant Pain Onset: More than a month ago Pain Frequency: Constant Pain Relieving Factors: Gabapentin and Tylenol  Pain Relieving Factors: Gabapentin and Tylenol  BMI - recorded: 29.55 Nutritional Status: BMI 25 -29 Overweight Nutritional Risks: None  Diabetes: No  How often do you need to have someone help you when you read instructions, pamphlets, or other written materials from your doctor or pharmacy?: 5 - Always  Interpreter Needed?: Yes Interpreter Agency: [employer] Interpreter Name: Olegario Messier Patient Declined Interpreter : No Patient signed [place] waiver: Yes  Information entered by :: Heriberto Stmartin, RMA   Activities of Daily Living    10/28/2022   10:41 AM 03/11/2022    3:46 PM  In your present state of health, do you have any difficulty performing the following activities:  Hearing? 0   Vision? 0   Difficulty concentrating or making decisions? 0   Walking or climbing stairs? 1   Dressing or bathing? 0   Doing errands, shopping? 0 0  Preparing Food and eating ? N   Using the Toilet? N   In the past six months, have you accidently leaked urine? N   Do you have problems with loss of bowel control? N   Managing your Medications? N   Managing your Finances? N   Housekeeping or managing your Housekeeping? N     Patient Care Team: Georgina Quint, MD as PCP - General (Internal Medicine) Georgina Quint, MD (Internal Medicine)  Indicate any recent Medical Services you may have received from other than [facility] providers in the past year (date may be approximate).     Assessment:   This is a routine wellness examination for Christine Hawkins.  Hearing/Vision  screen Hearing Screening - Comments:: Denies hearing difficulties   Vision Screening - Comments:: Wears eyeglasses   Goals Addressed   None   Depression Screen    10/28/2022   10:56 AM 05/30/2022    2:28 PM 11/25/2021    2:08 PM 08/24/2021    9:33 AM 12/07/2020    3:36 PM 07/07/2020    1:09 PM 03/18/2020    3:15 PM  PHQ 2/9 Scores  PHQ - 2 Score 0 0 0 0 0 0 0  PHQ- 9 Score 7          Fall Risk    10/28/2022   10:48 AM 05/30/2022    2:28 PM 11/25/2021    2:08 PM 08/24/2021    9:33 AM 12/07/2020    3:36 PM  Fall Risk   Falls in the past year? 0 0 0 0 0  Number falls in past yr: 0 0 0  0  Injury with Fall? 0 0 0  0  Risk for fall due to : No Fall Risks No Fall Risks No Fall Risks    Follow up Falls prevention discussed;Falls evaluation completed Falls evaluation completed Falls evaluation completed      MEDICARE RISK AT HOME: Medicare Risk at Home Any stairs in or around the home?: Yes If so, are there any without handrails?: Yes Home free of loose throw rugs in walkways, pet beds, electrical cords, etc?: Yes Adequate lighting in your home to reduce risk of falls?: Yes Life alert?: No Use of a cane, walker or w/c?: Yes Grab bars in the bathroom?: No Shower chair or bench in shower?: No Elevated toilet seat or a handicapped toilet?: No  TIMED UP AND GO:  Was the test performed?  Yes  Length of time to ambulate 10 feet: 15 sec Gait steady and fast with assistive device    Cognitive Function:        10/28/2022   10:49 AM  6CIT Screen  What Year? 0 points  What month? 0 points  What time? 0 points  Count back from 20 0 points  Months in reverse 0 points  Repeat phrase 0 points  Total Score 0 points    Immunizations Immunization History  Administered Date(s) Administered   Fluad Quad(high Dose 65+) 10/29/2018, 02/13/2020, 11/25/2021   Fluad Trivalent(High Dose 65+) 10/18/2022   Influenza Whole 12/01/2006   Influenza,inj,Quad PF,6+ Mos 12/17/2015,  10/18/2016, 01/29/2018   Pneumococcal Conjugate-13 10/29/2018   Pneumococcal Polysaccharide-23 01/29/2018   Tdap 02/24/2010   Zoster Recombinant(Shingrix) 07/07/2020   Zoster, Live 06/10/2013    TDAP status: Due, Education has been provided regarding the importance of this vaccine. Advised may receive this vaccine at local pharmacy or Health Dept. Aware to provide a copy of the vaccination record if obtained from local pharmacy or Health Dept. Verbalized acceptance and understanding.  Flu Vaccine status: Up to date  Pneumococcal vaccine status: Up to date  Covid-19 vaccine status: Declined, Education has been provided regarding the importance of this vaccine but patient still declined. Advised may receive this vaccine at local pharmacy or Health Dept.or vaccine clinic. Aware to provide a copy of the vaccination record if obtained from local pharmacy or Health Dept. Verbalized acceptance and understanding.  Qualifies for Shingles Vaccine? Yes   Zostavax completed Yes   Shingrix Completed?: No.    Education has been provided regarding the importance of this vaccine. Patient has been advised to call insurance company to determine out of pocket expense if they have not yet received this vaccine. Advised may also receive vaccine at local pharmacy or Health Dept. Verbalized acceptance and understanding.  Screening Tests Health Maintenance  Topic Date Due   Zoster Vaccines- Shingrix (2 of 2) 09/01/2020   Fecal DNA (Cologuard)  04/03/2022   COVID-19 Vaccine (1 - 2023-24 season) Never done   Medicare Annual Wellness (AWV)  10/28/2023   Pneumonia Vaccine 21+ Years old (3 of 3 - PPSV23 or PCV20) 10/29/2023   MAMMOGRAM  01/18/2024   INFLUENZA VACCINE  Completed   DEXA SCAN  Completed   Hepatitis C Screening  Completed   HPV VACCINES  Aged Out   DTaP/Tdap/Td  Discontinued   Colonoscopy  Discontinued    Health Maintenance  Health Maintenance Due  Topic Date Due   Zoster Vaccines- Shingrix  (2 of 2) 09/01/2020   Fecal DNA (Cologuard)  04/03/2022   COVID-19 Vaccine (1 - 2023-24 season) Never done    Colorectal cancer screening: Type of screening: Colonoscopy. Completed 03/24/2020. Repeat every 10 years  Mammogram status: Completed 01/17/2022. Repeat every year 1  Lung Cancer Screening: (Low Dose CT Chest recommended if Age 62-80 years, 20 pack-year currently smoking OR have quit w/in 15years.) does not qualify.   Lung Cancer Screening Referral: N/A  Additional Screening:  Hepatitis C Screening: does qualify; Completed 11/09/2017  Vision Screening: Recommended annual ophthalmology exams for early detection of glaucoma and other disorders of the eye. Is the patient up to date with their annual eye exam?  No  Who is the provider or what is the name of the office in which the patient attends annual eye exams? N/A If pt is not established with a provider, would they like to be referred to a provider to establish care? Yes .   Dental Screening: Recommended annual dental exams for proper oral hygiene   Community Resource Referral / Chronic Care Management: CRR required this visit?  No   CCM required this visit?  No     Plan:     I have  personally reviewed and noted the following in the patient's chart:   Medical and social history Use of alcohol, tobacco or illicit drugs  Current medications and supplements including opioid prescriptions. Patient is not currently taking opioid prescriptions. Functional ability and status Nutritional status Physical activity Advanced directives List of other physicians Hospitalizations, surgeries, and ER visits in previous 12 months Vitals Screenings to include cognitive, depression, and falls Referrals and appointments  In addition, I have reviewed and discussed with patient certain preventive protocols, quality metrics, and best practice recommendations. A written personalized care plan for preventive services as well as  general preventive health recommendations were provided to patient.     Scot Shiraishi L Naveed Humphres, CMA   10/28/2022   After Visit Summary: (MyChart) Due to this being a telephonic visit, the after visit summary with patients personalized plan was offered to patient via MyChart   Nurse Notes: Patient is due for a Tdap and a 2nd Shingrix vaccine.  On patient's chart, it is showing that she had completed a DEXA, however I do not see a imaging for a DEXA in 2015.  Patient would like to discuss if she should get a cologuard kit or be finished with screenings, with Dr. Alvy Bimler during her next appointment.  Patient had no other concerns to address today.

## 2022-10-28 NOTE — Patient Instructions (Addendum)
Ms. Mesecher , Thank you for taking time to come for your Medicare Wellness Visit. I appreciate your ongoing commitment to your health goals. Please review the following plan we discussed and let me know if I can assist you in the future.   Referrals/Orders/Follow-Ups/Clinician Recommendations: You are due for a 2nd Shingles vaccine and a Tetanus vaccine.  You can get these done at your local pharmacy.  It was meeting you today.  Keep up the good work.           This is a list of the screening recommended for you and due dates:  Health Maintenance  Topic Date Due   Zoster (Shingles) Vaccine (2 of 2) 09/01/2020   Cologuard (Stool DNA test)  04/03/2022   COVID-19 Vaccine (1 - 2023-24 season) Never done   Medicare Annual Wellness Visit  10/28/2023   Pneumonia Vaccine (3 of 3 - PPSV23 or PCV20) 10/29/2023   Mammogram  01/18/2024   Flu Shot  Completed   DEXA scan (bone density measurement)  Completed   Hepatitis C Screening  Completed   HPV Vaccine  Aged Out   DTaP/Tdap/Td vaccine  Discontinued   Colon Cancer Screening  Discontinued    Advanced directives: (Copy Requested) Please bring a copy of your health care power of attorney and living will to the office to be added to your chart at your convenience.  Next Medicare Annual Wellness Visit scheduled for next year: Yes

## 2022-11-09 ENCOUNTER — Telehealth: Payer: Self-pay | Admitting: Emergency Medicine

## 2022-11-09 NOTE — Telephone Encounter (Signed)
Pt came in with a disability parking placard form that needs to be completed by her PCP. Once this is completed please let us know.

## 2022-11-10 NOTE — Telephone Encounter (Signed)
Handicap placard received, placed in provider box in office.

## 2022-11-11 NOTE — Telephone Encounter (Signed)
Called patient to inform her that the handicap placard is ready for pick up. Form is upfront

## 2022-11-15 NOTE — Telephone Encounter (Signed)
Patient's son has picked up forms.

## 2022-11-29 ENCOUNTER — Encounter: Payer: Self-pay | Admitting: Emergency Medicine

## 2022-11-29 ENCOUNTER — Ambulatory Visit: Payer: 59 | Admitting: Emergency Medicine

## 2022-11-29 VITALS — BP 126/84 | HR 56 | Temp 97.6°F | Ht 62.5 in | Wt 165.0 lb

## 2022-11-29 DIAGNOSIS — F3342 Major depressive disorder, recurrent, in full remission: Secondary | ICD-10-CM

## 2022-11-29 DIAGNOSIS — G992 Myelopathy in diseases classified elsewhere: Secondary | ICD-10-CM

## 2022-11-29 DIAGNOSIS — K219 Gastro-esophageal reflux disease without esophagitis: Secondary | ICD-10-CM

## 2022-11-29 DIAGNOSIS — M4802 Spinal stenosis, cervical region: Secondary | ICD-10-CM | POA: Diagnosis not present

## 2022-11-29 DIAGNOSIS — M150 Primary generalized (osteo)arthritis: Secondary | ICD-10-CM | POA: Diagnosis not present

## 2022-11-29 DIAGNOSIS — R7303 Prediabetes: Secondary | ICD-10-CM | POA: Diagnosis not present

## 2022-11-29 DIAGNOSIS — E785 Hyperlipidemia, unspecified: Secondary | ICD-10-CM | POA: Diagnosis not present

## 2022-11-29 NOTE — Assessment & Plan Note (Signed)
Stable and well-controlled Takes Tylenol as needed for pain

## 2022-11-29 NOTE — Progress Notes (Signed)
Christine Hawkins 67 y.o.   Chief Complaint  Patient presents with   Medical Management of Chronic Issues    6 month f/u    HISTORY OF PRESENT ILLNESS: This is a 67 y.o. female Christine Hawkins here for 70-month follow-up of chronic medical conditions Overall doing well. Has no complaints or medical concerns today.  HPI   Prior to Admission medications   Medication Sig Start Date End Date Taking? Authorizing Provider  acetaminophen (TYLENOL) 500 MG tablet Take 500 mg by mouth every 6 (six) hours as needed.   Yes [provider]  acetaminophen (TYLENOL) 650 MG CR tablet Take 650 mg by mouth every 8 (eight) hours as needed for pain.   Yes [provider]  azelastine (OPTIVAR) 0.05 % ophthalmic solution Place 1 drop into both eyes daily. 01/27/20  Yes [provider]  Bempedoic Acid-Ezetimibe (NEXLIZET) 180-10 MG TABS Take 1 tablet by mouth daily. 06/06/22  Yes Georgeanna Lea, MD  Cholecalciferol (VITAMIN D-3 PO) Take 1 capsule by mouth daily.   Yes [provider]  [facility] COENZYME Q10 PO Take 100 mg by mouth daily.   Yes [provider]  docusate sodium (COLACE) 100 MG capsule Take 1 capsule (100 mg total) by mouth 2 (two) times daily. 03/17/22 03/17/23 Yes Bedelia Person, MD  gabapentin (NEURONTIN) 100 MG capsule Take 1 capsule (100 mg total) by mouth at bedtime. Patient taking differently: Take 100 mg by mouth daily as needed (For pain). 10/19/21  Yes Vivi Barrack, DPM  Ginger, Zingiber officinalis, (GINGER PO) Take 1 capsule by mouth daily.   Yes [provider]  loratadine (CLARITIN) 10 MG tablet Take 10 mg by mouth daily.   Yes [provider]  Multiple Vitamins-Minerals (CENTRUM SILVER 50+WOMEN) TABS Take 1 tablet by mouth daily. Unknown strength   Yes [provider]  mupirocin ointment (BACTROBAN) 2 % Sig to affected area twice a day x 7 days. 03/18/19  Yes Kolbee Stallman, Eilleen Kempf, MD  rosuvastatin (CRESTOR)  40 MG tablet TAKE 1 TABLET BY MOUTH EVERY DAY 10/11/22  Yes Virtie Bungert, Eilleen Kempf, MD  triamcinolone (NASACORT) 55 MCG/ACT AERO nasal inhaler Place 2 sprays into the nose daily. 03/18/19  Yes Towanda Hornstein, Eilleen Kempf, MD  VITAMIN E PO Take 1 tablet by mouth daily.   Yes [provider]    Allergies  Allergen Reactions   Lactose Intolerance (Gi)    Other     Dish/laundry dish detergent and bleach-smell bothers patient and dust   Bupropion Nausea Only   Niaspan [Niacin] Rash    Itching    Patient Active Problem List   Diagnosis Date Noted   Bacterial conjunctivitis 10/18/2022   Stenosis of cervical spine with myelopathy (HCC) 03/17/2022   S/P cervical spinal fusion 03/17/2022   Degenerative disc disease, lumbar 11/25/2021   Osteopenia 09/01/2020   Depression 09/01/2020   Colon polyps 09/01/2020   Anxiety 09/01/2020   Trigger ring finger of right hand 07/07/2020   Chronic neck pain 11/07/2017   Bilateral carotid artery stenosis 05/11/2017   Insomnia due to other mental disorder 04/19/2016   Chronic bilateral low back pain without sciatica 09/14/2015   Prediabetes 04/02/2015   Hyperlipidemia LDL goal <130 04/02/2015   Gastroesophageal reflux disease without esophagitis 04/02/2015   Intrinsic eczema 04/02/2015   Recurrent major depressive disorder, in full remission (HCC) 04/02/2015   Carpal tunnel syndrome of right wrist 01/30/2015   Bilateral leg edema 01/30/2015   Status post cervical spinal  arthrodesis 05/22/2011   Primary osteoarthritis involving multiple joints 12/01/2006   Dyslipidemia 05/12/2006   History of colonic polyps 05/12/2006    Past Medical History:  Diagnosis Date   Anxiety    Colon polyps    Depression    GERD (gastroesophageal reflux disease)    High cholesterol    Hypertension    Osteopenia    PONV (postoperative nausea and vomiting)    Pre-diabetes     Past Surgical History:  Procedure Laterality Date   ANTERIOR CERVICAL DECOMP/DISCECTOMY  FUSION N/A 03/17/2022   Procedure: Anterior Cervical Decompression Fusion Cervical three-four, removal CERVICAL PLATE;  Surgeon: Bedelia Person, MD;  Location: [facility] OR;  Service: Neurosurgery;  Laterality: N/A;   BREAST LUMPECTOMY Left    CERVICAL SPINE SURGERY      2013 has 8 screws    CESAREAN SECTION     COLONOSCOPY  08/26/2009   Normal colonoscopy up to cecum    LEFT HEART CATHETERIZATION WITH CORONARY ANGIOGRAM N/A 06/07/2012   Procedure: LEFT HEART CATHETERIZATION WITH CORONARY ANGIOGRAM;  Surgeon: Pamella Pert, MD;  Location: [facility] CATH LAB;  Service: Cardiovascular;  Laterality: N/A;    Social History   Socioeconomic History   Marital status: Legally Separated    Spouse name: Not on file   Number of children: 2   Years of education: Not on file   Highest education level: Not on file  Occupational History   Occupation: retired  Tobacco Use   Smoking status: Never   Smokeless tobacco: Never  Vaping Use   Vaping status: Never Used  Substance and Sexual Activity   Alcohol use: Not Currently   Drug use: Not Currently   Sexual activity: Not Currently  Other Topics Concern   Not on file  Social History Narrative   ** Merged History Encounter **       Social Determinants of Health   Financial Resource Strain: Low Risk  (10/28/2022)   Overall Financial Resource Strain (CARDIA)    Difficulty of Paying Living Expenses: Not hard at all  Food Insecurity: No Food Insecurity (10/28/2022)   Hunger Vital Sign    Worried About Running Out of Food in the Last Year: Never true    Ran Out of Food in the Last Year: Never true  Transportation Needs: No Transportation Needs (10/28/2022)   PRAPARE - Administrator, Civil Service (Medical): No    Lack of Transportation (Non-Medical): No  Physical Activity: Insufficiently Active (10/28/2022)   Exercise Vital Sign    Days of Exercise per Week: 7 days    Minutes of Exercise per Session: 20 min  Stress: No Stress Concern  Present (10/28/2022)   [employer] of Occupational Health - Occupational Stress Questionnaire    Feeling of Stress : Not at all  Social Connections: Moderately Integrated (10/28/2022)   Social Connection and Isolation Panel [NHANES]    Frequency of Communication with Friends and Family: More than three times a week    Frequency of Social Gatherings with Friends and Family: Not on file    Attends Religious Services: More than 4 times per year    Active Member of [employer] or Organizations: Yes    Attends Banker Meetings: More than 4 times per year    Marital Status: Separated  Intimate Partner Violence: Not At Risk (10/28/2022)   Humiliation, Afraid, Rape, and Kick questionnaire    Fear of Current or Ex-Partner: No    Emotionally Abused: No  Physically Abused: No    Sexually Abused: No    Family History  Problem Relation Age of Onset   Heart disease Mother    Heart disease Father    Colon polyps Father    Kidney disease Maternal Grandmother    Stomach cancer Paternal Grandmother    Diabetes Paternal Grandmother    Esophageal cancer Neg Hx    Colon cancer Neg Hx    Rectal cancer Neg Hx      Review of Systems  Constitutional: Negative.  Negative for fever.  Respiratory: Negative.  Negative for cough and shortness of breath.   Cardiovascular: Negative.  Negative for chest pain and palpitations.  Gastrointestinal: Negative.  Negative for abdominal pain, diarrhea, nausea and vomiting.  Musculoskeletal:  Positive for joint pain.  Skin: Negative.  Negative for rash.  Neurological: Negative.  Negative for dizziness and headaches.  Endo/Heme/Allergies:  Positive for environmental allergies.    Vitals:   11/29/22 1407  BP: 126/84  Pulse: (!) 56  Temp: 97.6 F (36.4 C)  SpO2: 97%    Physical Exam Vitals reviewed.  Constitutional:      Appearance: Normal appearance.  HENT:     Head: Normocephalic.  Eyes:     Extraocular Movements: Extraocular movements  intact.     Conjunctiva/sclera: Conjunctivae normal.     Pupils: Pupils are equal, round, and reactive to light.  Cardiovascular:     Rate and Rhythm: Normal rate and regular rhythm.     Pulses: Normal pulses.     Heart sounds: Normal heart sounds.  Pulmonary:     Effort: Pulmonary effort is normal.     Breath sounds: Normal breath sounds.  Abdominal:     General: There is no distension.     Palpations: Abdomen is soft.     Tenderness: There is no abdominal tenderness.  Musculoskeletal:     Cervical back: No tenderness.  Lymphadenopathy:     Cervical: No cervical adenopathy.  Skin:    General: Skin is warm and dry.     Capillary Refill: Capillary refill takes less than 2 seconds.  Neurological:     General: No focal deficit present.     Mental Status: She is alert and oriented to person, place, and time.  Psychiatric:        Mood and Affect: Mood normal.        Behavior: Behavior normal.      ASSESSMENT & PLAN: A total of 43 minutes was spent with the patient and counseling/coordination of care regarding preparing for this visit, review of most recent office visit notes, review of multiple chronic medical conditions under management, review of most recent blood work results, review of all medications, pain management, review of health maintenance items, education on nutrition, prognosis, documentation, and need for follow-up.  Problem List Items Addressed This Visit       Digestive   Gastroesophageal reflux disease without esophagitis    Stable and asymptomatic         Nervous and Auditory   Stenosis of cervical spine with myelopathy (HCC)    Stable and well-controlled Takes Tylenol as needed for pain        Musculoskeletal and Integument   Primary osteoarthritis involving multiple joints    Stable and well-controlled Pain management discussed Continues Tylenol as needed for pain      Relevant Orders   CBC with Differential/Platelet     Other    Dyslipidemia - Primary    Stable chronic  condition Lipid profile done today Diet and nutrition discussed Continue rosuvastatin 40 mg daily The 10-year ASCVD risk score (Arnett DK, et al., 2019) is: 8.1%   Values used to calculate the score:     Age: 51 years     Sex: Female     Is Non-Hispanic African American: No     Diabetic: No     Tobacco smoker: No     Systolic Blood Pressure: 126 mmHg     Is BP treated: No     HDL Cholesterol: 51.9 mg/dL     Total Cholesterol: 178 mg/dL       Relevant Orders   CBC with Differential/Platelet   Lipid panel   Comprehensive metabolic panel   Prediabetes    Diet and nutrition discussed Blood work done today including hemoglobin A1c Cardiovascular risks associated with diabetes discussed      Relevant Orders   CBC with Differential/Platelet   Hemoglobin A1c   Comprehensive metabolic panel   Recurrent major depressive disorder, in full remission (HCC)    Stable.  No concerns.      Patient Instructions  Mantenimiento de la salud despus de los 65 aos de edad Health Maintenance After Age 27 Despus de los 65 aos de edad, corre un riesgo mayor de Film/video editor enfermedades e infecciones a Air cabin crew, como tambin de sufrir lesiones por cadas. Las cadas son la causa principal de las fracturas de huesos y lesiones en la cabeza de personas mayores de 65 aos de edad. Recibir cuidados preventivos de forma regular puede ayudarlo a mantenerse saludable y en buen [place]. Los cuidados preventivos incluyen realizarse anlisis de forma regular y Forensic psychologist en el estilo de vida segn las recomendaciones del mdico. Converse con el mdico sobre lo siguiente: Las pruebas de deteccin y los anlisis que debe International aid/development worker. Una prueba de deteccin es un estudio que se para Engineer, manufacturing la presencia de una enfermedad cuando no tiene sntomas. Un plan de dieta y ejercicios adecuado para usted. Qu debo saber sobre las pruebas de deteccin y los anlisis  para prevenir cadas? Realizarse pruebas de deteccin y [employer] es la mejor manera de Engineer, manufacturing un problema de salud de forma temprana. El diagnstico y tratamiento tempranos le brindan la mejor oportunidad de Chief Operating Officer las afecciones mdicas que son comunes despus de los 65 aos de edad. Ciertas afecciones y elecciones de estilo de vida pueden hacer que sea ms propenso a sufrir Engineer, site. El mdico puede recomendarle lo siguiente: Controles regulares de la visin. Una visin deficiente y afecciones como las cataratas pueden hacer que sea ms propenso a sufrir Engineer, site. Si [place] lentes, asegrese de obtener una receta actualizada si su visin cambia. Revisin de medicamentos. Revise regularmente con el mdico todos los medicamentos que toma, incluidos los medicamentos de [place]. Consulte al [employer] efectos secundarios que pueden hacer que sea ms propenso a sufrir Engineer, site. Informe al mdico si alguno de los medicamentos que toma lo hace sentir mareado o somnoliento. Controles de fuerza y equilibrio. El mdico puede recomendar ciertos estudios para controlar su fuerza y equilibrio al estar de pie, al caminar o al cambiar de posicin. Examen de los pies. El dolor y Materials engineer en los pies, como tambin no utilizar el calzado [place], pueden hacer que sea ms propenso a sufrir Engineer, site. Pruebas de deteccin, que incluyen las siguientes: Pruebas de deteccin para la osteoporosis. La osteoporosis es una afeccin que hace que los huesos se tornen ms dbiles y  se quiebren con ms facilidad. Pruebas de deteccin para la presin arterial. Los cambios en la presin arterial y los medicamentos para Chief Operating Officer la presin arterial pueden hacerlo sentir mareado. Prueba de deteccin de la depresin. Es ms probable que sufra una cada si tiene miedo a caerse, se siente deprimido o se siente incapaz de Probation officer. Prueba de deteccin de consumo de alcohol. Beber demasiado  alcohol puede afectar su equilibrio y puede hacer que sea ms propenso a sufrir Engineer, site. Siga estas indicaciones en su casa: Estilo de vida No beba alcohol si: Su mdico le indica no hacerlo. Si bebe alcohol: Limite la cantidad que bebe a lo siguiente: De 0 a 1 medida por da para las mujeres. De 0 a 2 medidas por da para los hombres. Sepa cunta cantidad de alcohol hay en las bebidas que toma. En los [address], una medida equivale a una botella de cerveza de 12 oz (355 ml), un vaso de vino de 5 oz (148 ml) o un vaso de una bebida alcohlica de alta graduacin de 1 oz (44 ml). No consuma ningn producto que contenga nicotina o tabaco. Estos productos incluyen cigarrillos, tabaco para Theatre manager y aparatos de vapeo, como los Administrator, Civil Service. Si necesita ayuda para dejar de consumir estos productos, consulte al [employer]. Actividad  Siga un programa de ejercicio regular para mantenerse en forma. Esto lo ayudar a Radio producer equilibrio. Consulte al mdico qu tipos de ejercicios son adecuados para usted. Si necesita un bastn o un andador, selo segn las recomendaciones del mdico. Utilice calzado con buen apoyo y suela antideslizante. Seguridad  Retire los [employer] puedan causar tropiezos tales como alfombras, cables u obstculos. Instale equipos de seguridad, como barras para sostn en los baos y barandas de seguridad en las escaleras. Mantenga las habitaciones y los pasillos bien iluminados. Indicaciones generales Hable con el mdico sobre sus riesgos de sufrir una cada. Infrmele a su mdico si: Se cae. Asegrese de informarle a su mdico acerca de todas las cadas, incluso aquellas que parecen ser [employer]. Se siente mareado, cansado (tiene fatiga) o siente que pierde el equilibrio. Use los medicamentos de venta libre y los recetados solamente como se lo haya indicado el mdico. Estos incluyen suplementos. Siga una dieta sana y [place] un peso saludable. Una dieta  saludable incluye productos lcteos descremados, carnes bajas en contenido de grasa ([place]), fibra de granos enteros, frijoles y [place] frutas y verduras. Mantngase al da con las vacunas. Realcese los estudios de rutina de la salud, dentales y de Wellsite geologist. Resumen Tener un estilo de vida saludable y recibir cuidados preventivos pueden ayudar a Research scientist (physical sciences) salud y el bienestar despus de los 65 aos de [place]. Realizarse pruebas de deteccin y [employer] es la mejor manera de Engineer, manufacturing un problema de salud de forma temprana y [place] a Automotive engineer una cada. El diagnstico y tratamiento tempranos le brindan la mejor oportunidad de Chief Operating Officer las afecciones mdicas ms comunes en las personas mayores de 65 aos de edad. Las cadas son la causa principal de las fracturas de huesos y lesiones en la cabeza de personas mayores de 65 aos de edad. Tome precauciones para evitar una cada en su casa. Trabaje con el mdico para saber qu cambios que puede hacer para mejorar su salud y [place], y para prevenir las cadas. Esta informacin no tiene Theme park manager el consejo del mdico. Asegrese de hacerle al mdico cualquier pregunta que tenga. Document Revised: 07/01/2020 Document Reviewed: 07/01/2020 Elsevier Patient  Education  2024 Elsevier Inc.    Edwina Barth, MD [facility]

## 2022-11-29 NOTE — Patient Instructions (Signed)
Mantenimiento de la salud despus de los 65 aos de edad Health Maintenance After Age 69 Despus de los 65 aos de edad, corre un riesgo mayor de padecer ciertas enfermedades e infecciones a largo plazo, como tambin de sufrir lesiones por cadas. Las cadas son la causa principal de las fracturas de huesos y lesiones en la cabeza de personas mayores de 65 aos de edad. Recibir cuidados preventivos de forma regular puede ayudarlo a mantenerse saludable y en buen estado. Los cuidados preventivos incluyen realizarse anlisis de forma regular y realizar cambios en el estilo de vida segn las recomendaciones del mdico. Converse con el mdico sobre lo siguiente: Las pruebas de deteccin y los anlisis que debe realizarse. Una prueba de deteccin es un estudio que se para detectar la presencia de una enfermedad cuando no tiene sntomas. Un plan de dieta y ejercicios adecuado para usted. Qu debo saber sobre las pruebas de deteccin y los anlisis para prevenir cadas? Realizarse pruebas de deteccin y anlisis es la mejor manera de detectar un problema de salud de forma temprana. El diagnstico y tratamiento tempranos le brindan la mejor oportunidad de controlar las afecciones mdicas que son comunes despus de los 65 aos de edad. Ciertas afecciones y elecciones de estilo de vida pueden hacer que sea ms propenso a sufrir una cada. El mdico puede recomendarle lo siguiente: Controles regulares de la visin. Una visin deficiente y afecciones como las cataratas pueden hacer que sea ms propenso a sufrir una cada. Si usa lentes, asegrese de obtener una receta actualizada si su visin cambia. Revisin de medicamentos. Revise regularmente con el mdico todos los medicamentos que toma, incluidos los medicamentos de venta libre. Consulte al mdico sobre los efectos secundarios que pueden hacer que sea ms propenso a sufrir una cada. Informe al mdico si alguno de los medicamentos que toma lo hace sentir mareado o  somnoliento. Controles de fuerza y equilibrio. El mdico puede recomendar ciertos estudios para controlar su fuerza y equilibrio al estar de pie, al caminar o al cambiar de posicin. Examen de los pies. El dolor y el adormecimiento en los pies, como tambin no utilizar el calzado adecuado, pueden hacer que sea ms propenso a sufrir una cada. Pruebas de deteccin, que incluyen las siguientes: Pruebas de deteccin para la osteoporosis. La osteoporosis es una afeccin que hace que los huesos se tornen ms dbiles y se quiebren con ms facilidad. Pruebas de deteccin para la presin arterial. Los cambios en la presin arterial y los medicamentos para controlar la presin arterial pueden hacerlo sentir mareado. Prueba de deteccin de la depresin. Es ms probable que sufra una cada si tiene miedo a caerse, se siente deprimido o se siente incapaz de realizar actividades que sola hacer. Prueba de deteccin de consumo de alcohol. Beber demasiado alcohol puede afectar su equilibrio y puede hacer que sea ms propenso a sufrir una cada. Siga estas indicaciones en su casa: Estilo de vida No beba alcohol si: Su mdico le indica no hacerlo. Si bebe alcohol: Limite la cantidad que bebe a lo siguiente: De 0 a 1 medida por da para las mujeres. De 0 a 2 medidas por da para los hombres. Sepa cunta cantidad de alcohol hay en las bebidas que toma. En los [place], una medida equivale a una botella de cerveza de 12 oz (355 ml), un vaso de vino de 5 oz (148 ml) o un vaso de una bebida alcohlica de alta graduacin de 1 oz (44 ml). No consuma ningn producto que   contenga nicotina o tabaco. Estos productos incluyen cigarrillos, tabaco para mascar y aparatos de vapeo, como los cigarrillos electrnicos. Si necesita ayuda para dejar de consumir estos productos, consulte al mdico. Actividad  Siga un programa de ejercicio regular para mantenerse en forma. Esto lo ayudar a mantener el equilibrio. Consulte al  mdico qu tipos de ejercicios son adecuados para usted. Si necesita un bastn o un andador, selo segn las recomendaciones del mdico. Utilice calzado con buen apoyo y suela antideslizante. Seguridad  Retire los objetos que puedan causar tropiezos tales como alfombras, cables u obstculos. Instale equipos de seguridad, como barras para sostn en los baos y barandas de seguridad en las escaleras. Mantenga las habitaciones y los pasillos bien iluminados. Indicaciones generales Hable con el mdico sobre sus riesgos de sufrir una cada. Infrmele a su mdico si: Se cae. Asegrese de informarle a su mdico acerca de todas las cadas, incluso aquellas que parecen ser menores. Se siente mareado, cansado (tiene fatiga) o siente que pierde el equilibrio. Use los medicamentos de venta libre y los recetados solamente como se lo haya indicado el mdico. Estos incluyen suplementos. Siga una dieta sana y mantenga un peso saludable. Una dieta saludable incluye productos lcteos descremados, carnes bajas en contenido de grasa (magras), fibra de granos enteros, frijoles y muchas frutas y verduras. Mantngase al da con las vacunas. Realcese los estudios de rutina de la salud, dentales y de la vista. Resumen Tener un estilo de vida saludable y recibir cuidados preventivos pueden ayudar a promover la salud y el bienestar despus de los 65 aos de edad. Realizarse pruebas de deteccin y anlisis es la mejor manera de detectar un problema de salud de forma temprana y ayudarlo a evitar una cada. El diagnstico y tratamiento tempranos le brindan la mejor oportunidad de controlar las afecciones mdicas ms comunes en las personas mayores de 65 aos de edad. Las cadas son la causa principal de las fracturas de huesos y lesiones en la cabeza de personas mayores de 65 aos de edad. Tome precauciones para evitar una cada en su casa. Trabaje con el mdico para saber qu cambios que puede hacer para mejorar su salud y  bienestar, y para prevenir las cadas. Esta informacin no tiene como fin reemplazar el consejo del mdico. Asegrese de hacerle al mdico cualquier pregunta que tenga. Document Revised: 07/01/2020 Document Reviewed: 07/01/2020 Elsevier Patient Education  2024 Elsevier Inc.  

## 2022-11-29 NOTE — Assessment & Plan Note (Signed)
Stable chronic condition Lipid profile done today Diet and nutrition discussed Continue rosuvastatin 40 mg daily The 10-year ASCVD risk score (Arnett DK, et al., 2019) is: 8.1%   Values used to calculate the score:     Age: 67 years     Sex: Female     Is Non-Hispanic African American: No     Diabetic: No     Tobacco smoker: No     Systolic Blood Pressure: 126 mmHg     Is BP treated: No     HDL Cholesterol: 51.9 mg/dL     Total Cholesterol: 178 mg/dL

## 2022-11-29 NOTE — Assessment & Plan Note (Signed)
Diet and nutrition discussed Blood work done today including hemoglobin A1c Cardiovascular risks associated with diabetes discussed

## 2022-11-29 NOTE — Assessment & Plan Note (Signed)
Stable and asymptomatic  

## 2022-11-29 NOTE — Assessment & Plan Note (Signed)
Stable.  No concerns. °

## 2022-11-29 NOTE — Assessment & Plan Note (Signed)
Stable and well-controlled Pain management discussed Continues Tylenol as needed for pain

## 2022-11-30 ENCOUNTER — Other Ambulatory Visit (INDEPENDENT_AMBULATORY_CARE_PROVIDER_SITE_OTHER): Payer: 59

## 2022-11-30 DIAGNOSIS — E785 Hyperlipidemia, unspecified: Secondary | ICD-10-CM | POA: Diagnosis not present

## 2022-11-30 DIAGNOSIS — R7303 Prediabetes: Secondary | ICD-10-CM

## 2022-11-30 DIAGNOSIS — M150 Primary generalized (osteo)arthritis: Secondary | ICD-10-CM | POA: Diagnosis not present

## 2022-11-30 LAB — CBC WITH DIFFERENTIAL/PLATELET
Basophils Absolute: 0 10*3/uL (ref 0.0–0.1)
Basophils Relative: 0.4 % (ref 0.0–3.0)
Eosinophils Absolute: 0.2 10*3/uL (ref 0.0–0.7)
Eosinophils Relative: 3.1 % (ref 0.0–5.0)
HCT: 41.8 % (ref 36.0–46.0)
Hemoglobin: 12.9 g/dL (ref 12.0–15.0)
Lymphocytes Relative: 47.4 % — ABNORMAL HIGH (ref 12.0–46.0)
Lymphs Abs: 2.3 10*3/uL (ref 0.7–4.0)
MCHC: 30.9 g/dL (ref 30.0–36.0)
MCV: 84.2 fL (ref 78.0–100.0)
Monocytes Absolute: 0.5 10*3/uL (ref 0.1–1.0)
Monocytes Relative: 9.7 % (ref 3.0–12.0)
Neutro Abs: 1.9 10*3/uL (ref 1.4–7.7)
Neutrophils Relative %: 39.4 % — ABNORMAL LOW (ref 43.0–77.0)
Platelets: 207 10*3/uL (ref 150.0–400.0)
RBC: 4.96 Mil/uL (ref 3.87–5.11)
RDW: 14.5 % (ref 11.5–15.5)
WBC: 4.9 10*3/uL (ref 4.0–10.5)

## 2022-11-30 LAB — COMPREHENSIVE METABOLIC PANEL
ALT: 19 U/L (ref 0–35)
AST: 18 U/L (ref 0–37)
Albumin: 4.3 g/dL (ref 3.5–5.2)
Alkaline Phosphatase: 67 U/L (ref 39–117)
BUN: 12 mg/dL (ref 6–23)
CO2: 31 meq/L (ref 19–32)
Calcium: 9.7 mg/dL (ref 8.4–10.5)
Chloride: 103 meq/L (ref 96–112)
Creatinine, Ser: 0.83 mg/dL (ref 0.40–1.20)
GFR: 71.89 mL/min (ref >60.00)
Glucose, Bld: 108 mg/dL — ABNORMAL HIGH (ref 70–99)
Potassium: 4.1 meq/L (ref 3.5–5.1)
Sodium: 139 meq/L (ref 135–145)
Total Bilirubin: 0.5 mg/dL (ref 0.2–1.2)
Total Protein: 7 g/dL (ref 6.0–8.3)

## 2022-11-30 LAB — HEMOGLOBIN A1C: Hgb A1c MFr Bld: 6.3 % (ref 4.6–6.5)

## 2022-11-30 LAB — LIPID PANEL
Cholesterol: 390 mg/dL — ABNORMAL HIGH (ref 0–200)
HDL: 59.2 mg/dL (ref >39.00)
LDL Cholesterol: 299 mg/dL — ABNORMAL HIGH (ref 0–99)
NonHDL: 330.39
Total CHOL/HDL Ratio: 7
Triglycerides: 156 mg/dL — ABNORMAL HIGH (ref 0.0–149.0)
VLDL: 31.2 mg/dL (ref 0.0–40.0)

## 2023-02-07 ENCOUNTER — Encounter: Payer: Self-pay | Admitting: Cardiology

## 2023-02-07 ENCOUNTER — Ambulatory Visit: Payer: 59 | Attending: Cardiology | Admitting: Cardiology

## 2023-02-07 VITALS — BP 132/68 | HR 56 | Ht 62.5 in | Wt 166.4 lb

## 2023-02-07 DIAGNOSIS — I6523 Occlusion and stenosis of bilateral carotid arteries: Secondary | ICD-10-CM | POA: Diagnosis not present

## 2023-02-07 DIAGNOSIS — F3342 Major depressive disorder, recurrent, in full remission: Secondary | ICD-10-CM

## 2023-02-07 DIAGNOSIS — E785 Hyperlipidemia, unspecified: Secondary | ICD-10-CM | POA: Diagnosis not present

## 2023-02-07 DIAGNOSIS — R7303 Prediabetes: Secondary | ICD-10-CM | POA: Diagnosis not present

## 2023-02-07 NOTE — Patient Instructions (Addendum)
 Medication Instructions:  Your physician recommends that you continue on your current medications as directed. Please refer to the Current Medication list given to you today.  *If you need a refill on your cardiac medications before your next appointment, please call your pharmacy*   Lab Work: Your physician recommends that you return for lab work in: 2 months You need to have labs done when you are fasting.  You can come Monday through Friday 8:30 am to 12:00 pm and 1:15 to 4:30. You do not need to make an appointment as the order has already been placed. The labs you are going to have done are AST, ALT, Lipids.    Testing/Procedures: None Ordered   Follow-Up: At [facility], you and your health needs are our priority.  As part of our continuing mission to provide you with exceptional heart care, we have created designated Provider Care Teams.  These Care Teams include your primary Cardiologist (physician) and Advanced Practice Providers (APPs -  Physician Assistants and Nurse Practitioners) who all work together to provide you with the care you need, when you need it.  We recommend signing up for the patient portal called MyChart.  Sign up information is provided on this After Visit Summary.  MyChart is used to connect with patients for Virtual Visits (Telemedicine).  Patients are able to view lab/test results, encounter notes, upcoming appointments, etc.  Non-urgent messages can be sent to your provider as well.   To learn more about what you can do with MyChart, go to [email or web address].    Your next appointment:   6 month(s)  The format for your next appointment:   In Person  Provider:   Lamar Fitch, MD    Other Instructions NA

## 2023-02-07 NOTE — Progress Notes (Signed)
 Cardiology Office Note:    Date:  02/07/2023   ID:  Christine Hawkins, DOB [birth year], MRN [id number]  PCP:  Purcell Emil Schanz, MD  Cardiologist:  Lamar Fitch, MD    Referring MD: Purcell Emil Schanz, *   No chief complaint on file.   History of Present Illness:    Christine Hawkins is a 67 y.o. female past medical history significant for atypical chest pain, dizziness, dyslipidemia, bladder status were performed and all were negative.  The only thing identified was 39% stenosis both sides and carotic arteries.  No TIA or CVA-like symptoms.  She did have a stress test which was negative.  Comes today to my office for follow-up described to have some pain very atypical stabbing sharp pinpoint in the left side not related to exercise.  Past Medical History:  Diagnosis Date   Anxiety    Colon polyps    Depression    GERD (gastroesophageal reflux disease)    High cholesterol    Hypertension    Osteopenia    PONV (postoperative nausea and vomiting)    Pre-diabetes     Past Surgical History:  Procedure Laterality Date   ANTERIOR CERVICAL DECOMP/DISCECTOMY FUSION N/A 03/17/2022   Procedure: Anterior Cervical Decompression Fusion Cervical three-four, removal CERVICAL PLATE;  Surgeon: Debby Dorn MATSU, MD;  Location: [facility] OR;  Service: Neurosurgery;  Laterality: N/A;   BREAST LUMPECTOMY Left    CERVICAL SPINE SURGERY      2013 has 8 screws    CESAREAN SECTION     COLONOSCOPY  08/26/2009   Normal colonoscopy up to cecum    LEFT HEART CATHETERIZATION WITH CORONARY ANGIOGRAM N/A 06/07/2012   Procedure: LEFT HEART CATHETERIZATION WITH CORONARY ANGIOGRAM;  Surgeon: Erick JONELLE Bergamo, MD;  Location: [facility] CATH LAB;  Service: Cardiovascular;  Laterality: N/A;    Current Medications: Current Meds  Medication Sig   acetaminophen  (TYLENOL ) 500 MG tablet Take 500 mg by mouth every 6 (six) hours as needed.   aspirin  EC 81 MG tablet Take 81 mg by mouth daily. Swallow whole.    azelastine (OPTIVAR) 0.05 % ophthalmic solution Place 1 drop into both eyes daily.   Cholecalciferol (VITAMIN D-3 PO) Take 1 capsule by mouth daily.   gabapentin  (NEURONTIN ) 100 MG capsule Take 1 capsule (100 mg total) by mouth at bedtime. (Patient taking differently: Take 100 mg by mouth daily as needed (For pain).)   Ginger, Zingiber officinalis, (GINGER PO) Take 1 capsule by mouth daily.   loratadine  (CLARITIN ) 10 MG tablet Take 10 mg by mouth daily.   rosuvastatin  (CRESTOR ) 40 MG tablet TAKE 1 TABLET BY MOUTH EVERY DAY     Allergies:   Lactose intolerance (gi), Other, Bupropion, and Niaspan [niacin]   Social History   Socioeconomic History   Marital status: Legally Separated    Spouse name: Not on file   Number of children: 2   Years of education: Not on file   Highest education level: Not on file  Occupational History   Occupation: retired  Tobacco Use   Smoking status: Never   Smokeless tobacco: Never  Vaping Use   Vaping status: Never Used  Substance and Sexual Activity   Alcohol use: Not Currently   Drug use: Not Currently   Sexual activity: Not Currently  Other Topics Concern   Not on file  Social History Narrative   ** Merged History Encounter **       Social Drivers of Health   Financial Resource Strain:  Low Risk  (10/28/2022)   Overall Financial Resource Strain (CARDIA)    Difficulty of Paying Living Expenses: Not hard at all  Food Insecurity: No Food Insecurity (10/28/2022)   Hunger Vital Sign    Worried About Running Out of Food in the Last Year: Never true    Ran Out of Food in the Last Year: Never true  Transportation Needs: No Transportation Needs (10/28/2022)   PRAPARE - Administrator, Civil Service (Medical): No    Lack of Transportation (Non-Medical): No  Physical Activity: Insufficiently Active (10/28/2022)   Exercise Vital Sign    Days of Exercise per Week: 7 days    Minutes of Exercise per Session: 20 min  Stress: No Stress Concern  Present (10/28/2022)   [employer] of Occupational Health - Occupational Stress Questionnaire    Feeling of Stress : Not at all  Social Connections: Moderately Integrated (10/28/2022)   Social Connection and Isolation Panel [NHANES]    Frequency of Communication with Friends and Family: More than three times a week    Frequency of Social Gatherings with Friends and Family: Not on file    Attends Religious Services: More than 4 times per year    Active Member of [employer] or Organizations: Yes    Attends Engineer, Structural: More than 4 times per year    Marital Status: Separated     Family History: The patient's family history includes Colon polyps in her father; Diabetes in her paternal grandmother; Heart disease in her father and mother; Kidney disease in her maternal grandmother; Stomach cancer in her paternal grandmother. There is no history of Esophageal cancer, Colon cancer, or Rectal cancer. ROS:   Please see the history of present illness.    All 14 point review of systems negative except as described per history of present illness  EKGs/Labs/Other Studies Reviewed:    EKG Interpretation Date/Time:  Tuesday February 07 2023 13:06:31 EST Ventricular Rate:  57 PR Interval:  154 QRS Duration:  98 QT Interval:  436 QTC Calculation: 424 R Axis:   -23  Text Interpretation: Sinus bradycardia Minimal voltage criteria for LVH, may be normal variant Borderline ECG When compared with ECG of 11-Mar-2022 15:57, Premature atrial complexes are no longer Present Confirmed by Bernie Charleston [id number]) on 02/07/2023 1:21:35 PM    Recent Labs: 11/30/2022: ALT 19; BUN 12; Creatinine, Ser 0.83; Hemoglobin 12.9; Platelets 207.0; Potassium 4.1; Sodium 139  Recent Lipid Panel    Component Value Date/Time   CHOL 390 (H) 11/30/2022 1024   CHOL 398 (H) 08/12/2021 0957   TRIG 156.0 (H) 11/30/2022 1024   TRIG 74 01/16/2006 1115   HDL 59.20 11/30/2022 1024   HDL 56 08/12/2021 0957    CHOLHDL 7 11/30/2022 1024   VLDL 31.2 11/30/2022 1024   LDLCALC 299 (H) 11/30/2022 1024   LDLCALC 318 (H) 08/12/2021 0957   LDLDIRECT 183.2 05/30/2006 1047    Physical Exam:    VS:  BP 132/68   Pulse (!) 56   Ht 5' 2.5 (1.588 m)   Wt 166 lb 6.4 oz (75.5 kg)   SpO2 98%   BMI 29.95 kg/m     Wt Readings from Last 3 Encounters:  02/07/23 166 lb 6.4 oz (75.5 kg)  11/29/22 165 lb (74.8 kg)  10/28/22 164 lb 3.2 oz (74.5 kg)     GEN:  Well nourished, well developed in no acute distress HEENT: Normal NECK: No JVD; No carotid bruits LYMPHATICS: No lymphadenopathy CARDIAC:  RRR, no murmurs, no rubs, no gallops RESPIRATORY:  Clear to auscultation without rales, wheezing or rhonchi  ABDOMEN: Soft, non-tender, non-distended MUSCULOSKELETAL:  No edema; No deformity  SKIN: Warm and dry LOWER EXTREMITIES: no swelling NEUROLOGIC:  Alert and oriented x 3 PSYCHIATRIC:  Normal affect   ASSESSMENT:    1. Bilateral carotid artery stenosis   2. Dyslipidemia   3. Prediabetes   4. Recurrent major depressive disorder, in full remission (HCC)    PLAN:    In order of problems listed above:  Atypical chest pain.  Not related to heart stress test negative continue monitoring and risk factors modifications. Dyslipidemia incredibly dramatic change in her cholesterol her LDL became very elevated 299 she tells me that she has been taking all medications which she was before the only thing she changes started using coconut oil because she thinks is good for her.  Over this I asked her to stop doing that. Depression seems to be stable. Fatigue tiredness is still present In terms of   Medication Adjustments/Labs and Tests Ordered: Current medicines are reviewed at length with the patient today.  Concerns regarding medicines are outlined above.  Orders Placed This Encounter  Procedures   EKG 12-Lead   Medication changes: No orders of the defined types were placed in this  encounter.   Signed, Lamar DOROTHA Fitch, MD, [facility] 02/07/2023 1:36 PM    [facility]

## 2023-02-20 DIAGNOSIS — H25813 Combined forms of age-related cataract, bilateral: Secondary | ICD-10-CM | POA: Diagnosis not present

## 2023-03-27 DIAGNOSIS — Z01818 Encounter for other preprocedural examination: Secondary | ICD-10-CM | POA: Diagnosis not present

## 2023-03-27 DIAGNOSIS — H25812 Combined forms of age-related cataract, left eye: Secondary | ICD-10-CM | POA: Diagnosis not present

## 2023-03-30 DIAGNOSIS — I10 Essential (primary) hypertension: Secondary | ICD-10-CM | POA: Diagnosis not present

## 2023-03-30 DIAGNOSIS — H25812 Combined forms of age-related cataract, left eye: Secondary | ICD-10-CM | POA: Diagnosis not present

## 2023-04-12 ENCOUNTER — Other Ambulatory Visit: Payer: Self-pay | Admitting: Emergency Medicine

## 2023-04-12 DIAGNOSIS — E785 Hyperlipidemia, unspecified: Secondary | ICD-10-CM

## 2023-04-12 NOTE — Telephone Encounter (Signed)
 Copied from CRM [id number]. Topic: Clinical - Medication Refill >> Apr 12, 2023  3:32 PM Sonny Dandy B wrote: Most Recent Primary Care Visit:  Provider: [facility]  Department[facility]  Visit Type: LAB  Date: 11/30/2022  Medication:rosuvastatin (CRESTOR) 40 MG tablet  Has the patient contacted their pharmacy? Yes (Agent: If no, request that the patient contact the pharmacy for the refill. If patient does not wish to contact the pharmacy document the reason why and proceed with request.) (Agent: If yes, when and what did the pharmacy advise?)  Is this the correct pharmacy for this prescription? Yes If no, delete pharmacy and type the correct one.  This is the patient's preferred pharmacy:  [facility] mail order delivery    Has the prescription been filled recently? Yes  Is the patient out of the medication? Yes  Has the patient been seen for an appointment in the last year OR does the patient have an upcoming appointment? Yes  Can we respond through MyChart? Yes  Agent: Please be advised that Rx refills may take up to 3 business days. We ask that you follow-up with your pharmacy.

## 2023-04-12 NOTE — Telephone Encounter (Signed)
 Copied from CRM [id number]. Topic: Clinical - Medication Refill >> Apr 12, 2023  3:22 PM Sonny Dandy B wrote: Most Recent Primary Care Visit:  Provider: [facility]  Department: [facility]  Visit Type: LAB  Date: 11/30/2022  Medication: rosuvastatin (CRESTOR) 40 MG tablet  Has the patient contacted their pharmacy? Yes (Agent: If no, request that the patient contact the pharmacy for the refill. If patient does not wish to contact the pharmacy document the reason why and proceed with request.) (Agent: If yes, when and what did the pharmacy advise?)  Is this the correct pharmacy for this prescription? Yes If no, delete pharmacy and type the correct one.  This is the patient's preferred pharmacy:   [facility]  [phone] or [phone] fax [phone]   Has the prescription been filled recently? Yes  Is the patient out of the medication? Yes  Has the patient been seen for an appointment in the last year OR does the patient have an upcoming appointment? Yes  Can we respond through MyChart? Yes  Agent: Please be advised that Rx refills may take up to 3 business days. We ask that you follow-up with your pharmacy.

## 2023-04-12 NOTE — Telephone Encounter (Signed)
 Attempted to call patient with Spanish interpreter [place], [place] #[id number]. Unable to contact patient. Duplicate refill request, closing encounter please see refill request dated 04/12/23 for patient's crestor request.

## 2023-04-13 MED ORDER — ROSUVASTATIN CALCIUM 40 MG PO TABS: 40.0000 mg | ORAL_TABLET | Freq: Every day | ORAL | 3 refills | Status: AC

## 2023-04-27 DIAGNOSIS — I10 Essential (primary) hypertension: Secondary | ICD-10-CM | POA: Diagnosis not present

## 2023-04-27 DIAGNOSIS — H25811 Combined forms of age-related cataract, right eye: Secondary | ICD-10-CM | POA: Diagnosis not present

## 2023-05-30 ENCOUNTER — Ambulatory Visit: Payer: 59 | Admitting: Emergency Medicine

## 2023-07-19 DIAGNOSIS — E785 Hyperlipidemia, unspecified: Secondary | ICD-10-CM | POA: Diagnosis not present

## 2023-07-20 DIAGNOSIS — H524 Presbyopia: Secondary | ICD-10-CM | POA: Diagnosis not present

## 2023-07-20 LAB — AST: AST: 18 IU/L (ref 0–40)

## 2023-07-20 LAB — LIPID PANEL
Chol/HDL Ratio: 3.3 ratio (ref 0.0–4.4)
Cholesterol, Total: 204 mg/dL — ABNORMAL HIGH (ref 100–199)
HDL: 62 mg/dL (ref >39)
LDL Chol Calc (NIH): 124 mg/dL — ABNORMAL HIGH (ref 0–99)
Triglycerides: 100 mg/dL (ref 0–149)
VLDL Cholesterol Cal: 18 mg/dL (ref 5–40)

## 2023-07-20 LAB — ALT: ALT: 18 IU/L (ref 0–32)

## 2023-07-21 ENCOUNTER — Ambulatory Visit: Payer: Self-pay | Admitting: Cardiology

## 2023-07-21 ENCOUNTER — Telehealth: Payer: Self-pay

## 2023-07-21 DIAGNOSIS — E785 Hyperlipidemia, unspecified: Secondary | ICD-10-CM

## 2023-07-21 NOTE — Telephone Encounter (Signed)
 Lab Results reviewed with pt as per Dr. Tonja Fray note.  Pt verbalized understanding and had no additional questions. Referred to Pharm D. Routed to PCP

## 2023-09-04 DIAGNOSIS — H1013 Acute atopic conjunctivitis, bilateral: Secondary | ICD-10-CM | POA: Diagnosis not present

## 2023-11-21 ENCOUNTER — Emergency Department (HOSPITAL_COMMUNITY)
Admission: EM | Admit: 2023-11-21 | Discharge: 2023-11-22 | Disposition: A | Discharge status: Home / Self Care | Attending: Emergency Medicine | Admitting: Emergency Medicine

## 2023-11-21 ENCOUNTER — Ambulatory Visit: Payer: Self-pay | Admitting: *Deleted

## 2023-11-21 ENCOUNTER — Other Ambulatory Visit: Payer: Self-pay

## 2023-11-21 ENCOUNTER — Encounter (HOSPITAL_COMMUNITY): Payer: Self-pay | Admitting: Emergency Medicine

## 2023-11-21 ENCOUNTER — Emergency Department (HOSPITAL_COMMUNITY)

## 2023-11-21 DIAGNOSIS — M79601 Pain in right arm: Secondary | ICD-10-CM | POA: Insufficient documentation

## 2023-11-21 DIAGNOSIS — R202 Paresthesia of skin: Secondary | ICD-10-CM | POA: Insufficient documentation

## 2023-11-21 DIAGNOSIS — R001 Bradycardia, unspecified: Secondary | ICD-10-CM | POA: Diagnosis not present

## 2023-11-21 DIAGNOSIS — R29818 Other symptoms and signs involving the nervous system: Secondary | ICD-10-CM | POA: Diagnosis not present

## 2023-11-21 DIAGNOSIS — I10 Essential (primary) hypertension: Secondary | ICD-10-CM | POA: Diagnosis not present

## 2023-11-21 DIAGNOSIS — Z7982 Long term (current) use of aspirin: Secondary | ICD-10-CM | POA: Insufficient documentation

## 2023-11-21 DIAGNOSIS — R519 Headache, unspecified: Secondary | ICD-10-CM | POA: Diagnosis not present

## 2023-11-21 LAB — COMPREHENSIVE METABOLIC PANEL WITH GFR
ALT: 24 U/L (ref 0–44)
AST: 23 U/L (ref 15–41)
Albumin: 4.1 g/dL (ref 3.5–5.0)
Alkaline Phosphatase: 60 U/L (ref 38–126)
Anion gap: 10 (ref 5–15)
BUN: 12 mg/dL (ref 8–23)
CO2: 28 mmol/L (ref 22–32)
Calcium: 9.4 mg/dL (ref 8.9–10.3)
Chloride: 103 mmol/L (ref 98–111)
Creatinine, Ser: 0.77 mg/dL (ref 0.44–1.00)
GFR, Estimated: >60 mL/min (ref >60)
Glucose, Bld: 98 mg/dL (ref 70–99)
Potassium: 4.4 mmol/L (ref 3.5–5.1)
Sodium: 141 mmol/L (ref 135–145)
Total Bilirubin: 0.6 mg/dL (ref 0.0–1.2)
Total Protein: 7 g/dL (ref 6.5–8.1)

## 2023-11-21 LAB — CBC
HCT: 40.8 % (ref 36.0–46.0)
Hemoglobin: 12.7 g/dL (ref 12.0–15.0)
MCH: 26.6 pg (ref 26.0–34.0)
MCHC: 31.1 g/dL (ref 30.0–36.0)
MCV: 85.4 fL (ref 80.0–100.0)
Platelets: 216 K/uL (ref 150–400)
RBC: 4.78 MIL/uL (ref 3.87–5.11)
RDW: 14.2 % (ref 11.5–15.5)
WBC: 6.6 K/uL (ref 4.0–10.5)
nRBC: 0 % (ref 0.0–0.2)

## 2023-11-21 LAB — DIFFERENTIAL
Abs Immature Granulocytes: 0.01 K/uL (ref 0.00–0.07)
Basophils Absolute: 0 K/uL (ref 0.0–0.1)
Basophils Relative: 1 %
Eosinophils Absolute: 0.2 K/uL (ref 0.0–0.5)
Eosinophils Relative: 3 %
Immature Granulocytes: 0 %
Lymphocytes Relative: 42 %
Lymphs Abs: 2.8 K/uL (ref 0.7–4.0)
Monocytes Absolute: 0.7 K/uL (ref 0.1–1.0)
Monocytes Relative: 10 %
Neutro Abs: 2.9 K/uL (ref 1.7–7.7)
Neutrophils Relative %: 44 %

## 2023-11-21 LAB — CBG MONITORING, ED: Glucose-Capillary: 89 mg/dL (ref 70–99)

## 2023-11-21 MED ORDER — METHOCARBAMOL 500 MG PO TABS
750.0000 mg | ORAL_TABLET | Freq: Once | ORAL | Status: AC
  Administered 2023-11-22: 750 mg via ORAL
  Filled 2023-11-21: qty 2

## 2023-11-21 MED ORDER — KETOROLAC TROMETHAMINE 15 MG/ML IJ SOLN
15.0000 mg | Freq: Once | INTRAMUSCULAR | Status: DC
  Filled 2023-11-21: qty 1

## 2023-11-21 NOTE — ED Triage Notes (Addendum)
 Pt presents to the ED for pain and swelling in right arm and hand as well as a heaviness/weakness in that right side which began around 3:30-4 AM on Monday.  Also pain in right leg. She has not been able to get relief. Pt also endorses HA's since Monday. She endorses HA when she went to bed Sunday night.  Pt also endorses some visual changes but passed visual tests from [facility] for provider.

## 2023-11-21 NOTE — Telephone Encounter (Signed)
 FYI Only or Action Required?: FYI only for provider.  Patient was last seen in primary care on 11/29/2022 by Purcell Emil Schanz, MD.  Called Nurse Triage reporting Numbness.  Symptoms began today.  Interventions attempted: Nothing.  Symptoms are: unchanged.  Triage Disposition: Go to ED Now (or PCP Triage)  Patient/caregiver understands and will follow disposition?: Yes                  Copied from CRM #[id number]. Topic: Clinical - Red Word Triage >> Nov 21, 2023 12:50 PM Zy'onna H wrote: Red Word that prompted transfer to Nurse Triage:  Patient is experiencing that her Left Arm was numb/tingling (3 o'clock am). - Feeling ill  High BP, son gave her a tea to aid in brining it down.  Looking to schedule appt.  [facility] [employer]. to NT** Reason for Disposition  Patient sounds very sick or weak to the triager  Answer Assessment - Initial Assessment Questions Recommended ED now for evaluation. Patient son called first and requesting appt. NT called patient to review if sx current now due to caller not with patient. Interpreter ID # [id number] called patient and call was disconnected during triage. Called using another interpreter [facility] ID # [id number]. Patient continues having numbness in right hand and palpitations. Denies chest pain. Little blurred vision.        1. SYMPTOM: What is the main symptom you are concerned about? (e.g., weakness, numbness)     Patient's son [place] on [place] not with patient , reports Sunday patient reported  N/T right  arm  and elevated BP . NT called patient back with interpreter and patient reports  2. ONSET: When did this start? (e.g., minutes, hours, days; while sleeping)     Sunday  3. LAST NORMAL: When was the last time you (the patient) were normal (no symptoms)?     Before Sunday at approx 3 am. Into Monday 4. PATTERN Does this come and go, or has it been constant since it started?  Is it present now?      Present now in right hand 5. CARDIAC SYMPTOMS: Have you had any of the following symptoms: chest pain, difficulty breathing, palpitations?     Palpitations now  6. NEUROLOGIC SYMPTOMS: Have you had any of the following symptoms: headache, dizziness, vision loss, double vision, changes in speech, unsteady on your feet?     Dizziness in head, can stand and walk. Numbness in right hand , little blurry vision today .  7. OTHER SYMPTOMS: Do you have any other symptoms?     Right hand numbness now, palpitations. No chest pain.  8. PREGNANCY: Is there any chance you are pregnant? When was your last menstrual period?     na  Protocols used: Neurologic Deficit-A-AH

## 2023-11-21 NOTE — ED Provider Notes (Incomplete)
 [place] EMERGENCY DEPARTMENT AT [facility] Provider Note  CSN: [id number] Arrival date & time: 11/21/23 1721  Chief Complaint(s) No chief complaint on file.  HPI Christine Hawkins is a 68 y.o. female {Add pertinent medical, surgical, social history, OB history to HPI:1}   HPI  Past Medical History Past Medical History:  Diagnosis Date  . Anxiety   . Colon polyps   . Depression   . GERD (gastroesophageal reflux disease)   . High cholesterol   . Hypertension   . Osteopenia   . PONV (postoperative nausea and vomiting)   . Pre-diabetes    Patient Active Problem List   Diagnosis Date Noted  . Stenosis of cervical spine with myelopathy (HCC) 03/17/2022  . S/P cervical spinal fusion 03/17/2022  . Degenerative disc disease, lumbar 11/25/2021  . Osteopenia 09/01/2020  . Depression 09/01/2020  . Colon polyps 09/01/2020  . Anxiety 09/01/2020  . Trigger ring finger of right hand 07/07/2020  . Chronic neck pain 11/07/2017  . Bilateral carotid artery stenosis 05/11/2017  . Insomnia due to other mental disorder 04/19/2016  . Chronic bilateral low back pain without sciatica 09/14/2015  . Prediabetes 04/02/2015  . Hyperlipidemia LDL goal <130 04/02/2015  . Gastroesophageal reflux disease without esophagitis 04/02/2015  . Recurrent major depressive disorder, in full remission 04/02/2015  . Carpal tunnel syndrome of right wrist 01/30/2015  . Bilateral leg edema 01/30/2015  . Status post cervical spinal arthrodesis 05/22/2011  . Primary osteoarthritis involving multiple joints 12/01/2006  . Dyslipidemia 05/12/2006  . History of colonic polyps 05/12/2006   Home Medication(s) Prior to Admission medications   Medication Sig Start Date End Date Taking? Authorizing Provider  acetaminophen  (TYLENOL ) 500 MG tablet Take 500 mg by mouth every 6 (six) hours as needed.    [provider]  aspirin  EC 81 MG tablet Take 81 mg by mouth daily. Swallow whole.    [provider]  azelastine (OPTIVAR) 0.05 % ophthalmic solution Place 1 drop into both eyes daily. 01/27/20   [provider]  Bempedoic Acid -Ezetimibe  (NEXLIZET ) 180-10 MG TABS Take 1 tablet by mouth daily. Patient not taking: Reported on 02/07/2023 06/06/22   Krasowski, Robert J, MD  Cholecalciferol (VITAMIN D-3 PO) Take 1 capsule by mouth daily.    [provider]  [employer] COENZYME Q10 PO Take 100 mg by mouth daily. Patient not taking: Reported on 02/07/2023    [provider]  gabapentin  (NEURONTIN ) 100 MG capsule Take 1 capsule (100 mg total) by mouth at bedtime. Patient taking differently: Take 100 mg by mouth daily as needed (For pain). 10/19/21   Gershon Donnice SAUNDERS, DPM  Ginger, Zingiber officinalis, (GINGER PO) Take 1 capsule by mouth daily.    [provider]  loratadine  (CLARITIN ) 10 MG tablet Take 10 mg by mouth daily.    [provider]  Multiple Vitamins-Minerals (CENTRUM SILVER 50+WOMEN) TABS Take 1 tablet by mouth daily. Unknown strength Patient not taking: Reported on 02/07/2023    [provider]  mupirocin  ointment (BACTROBAN ) 2 % Sig to affected area twice a day x 7 days. Patient not taking: Reported on 02/07/2023 03/18/19   Sagardia, Miguel Jose, MD  rosuvastatin  (CRESTOR ) 40 MG tablet Take 1 tablet (40 mg total) by mouth daily. 04/13/23   Sagardia, Miguel Jose, MD  triamcinolone  (NASACORT ) 55 MCG/ACT AERO nasal inhaler Place 2 sprays into the nose daily. Patient not taking: Reported on 02/07/2023 03/18/19   Sagardia, Miguel Jose, MD  VITAMIN E PO  Take 1 tablet by mouth daily. Patient not taking: Reported on 02/07/2023    [provider]                                                                                                                                    Allergies Lactose intolerance (gi), Other, Bupropion, and Niaspan [niacin]  Review of Systems Review of Systems As noted in HPI  Physical Exam Vital  Signs  I have reviewed the triage vital signs BP (!) 154/75 (BP Location: Right Arm)   Pulse (!) 54   Temp 98.3 F (36.8 C)   Resp 20   SpO2 100%  *** Physical Exam  ED Results and Treatments Labs (all labs ordered are listed, but only abnormal results are displayed) Labs Reviewed  CBC  DIFFERENTIAL  COMPREHENSIVE METABOLIC PANEL WITH GFR  CBG MONITORING, ED                                                                                                                         EKG  EKG Interpretation Date/Time:    Ventricular Rate:    PR Interval:    QRS Duration:    QT Interval:    QTC Calculation:   R Axis:      Text Interpretation:         Radiology MR BRAIN WO CONTRAST Result Date: 11/21/2023 EXAM: MRI BRAIN WITHOUT CONTRAST 11/21/2023 10:26:00 PM TECHNIQUE: Multiplanar multisequence MRI of the head/brain was performed without the administration of intravenous contrast. COMPARISON: Comparison made with prior CT from 12/17/2018. CLINICAL HISTORY: Headache, neuro deficit. FINDINGS: BRAIN AND VENTRICLES: No acute infarct. No intracranial hemorrhage. No mass. No midline shift. No hydrocephalus. Scattered patchy T2/FLAIR hyperintensity involving the supratentorial cerebral white matter, nonspecific, but most likely related to chronic microvascular ischemic disease. These changes are overall mild to moderate in nature. The sella is unremarkable. Normal flow voids. ORBITS: Prior bilateral ocular lens replacement. No acute abnormality. SINUSES AND MASTOIDS: No acute abnormality. BONES AND SOFT TISSUES: Normal marrow signal. Postoperative changes noted within the visualized upper cervical spine. No acute soft tissue abnormality. IMPRESSION: 1. No acute intracranial abnormality. 2. Patchy T2/FLAIR signal abnormality involving the cerebral white matter, nonspecific, but most likely related to chronic microvascular ischemic disease. Changes are mild to moderate in nature. Electronically  signed by: Morene Hoard MD 11/21/2023 11:29 PM EDT RP Workstation: [id number]    Medications  Ordered in ED Medications - No data to display Procedures Procedures  (including critical care time) Medical Decision Making / ED Course   Medical Decision Making   ***    Final Clinical Impression(s) / ED Diagnoses Final diagnoses:  None    This chart was dictated using voice recognition software.  Despite best efforts to proofread,  errors can occur which can change the documentation meaning.

## 2023-11-21 NOTE — ED Notes (Signed)
 Patient transported to MRI

## 2023-11-21 NOTE — ED Provider Triage Note (Signed)
 Emergency Medicine Provider Triage Evaluation Note  Christine Hawkins , a 68 y.o. female  was evaluated in triage.  Pt complains new onset right sided numbness and weakness in the right upper and lower extremity.  She describes it as feeling painful like a water balloon is filled up.  She has had to use a cane.  Symptoms began suddenly when she awoke from sleep at 3:30 in the morning 2 days ago.  She went to sleep with a headache.  She has never had anything like this before.  Her son called the PCP who recommended she come in for further evaluation.  She denies any chest pains but has noted some palpitations.  She has no other complaints at this time..  Review of Systems  Positive: Numbness and weakness Negative: vomiting  Physical Exam  BP (!) 149/65   Pulse (!) 58   Temp 97.7 F (36.5 C)   Resp 18   SpO2 100%  Gen:   Awake, no distress   Resp:  Normal effort  MSK:   Moves extremities without difficulty  Other:  Objective weakness on exam, no obvious visual deficits  Medical Decision Making  Medically screening exam initiated at 6:58 PM.  Appropriate orders placed.  Christine Hawkins was informed that the remainder of the evaluation will be completed by another provider, this initial triage assessment does not replace that evaluation, and the importance of remaining in the ED until their evaluation is complete.   Case discussed with Dr. Voncile recommends MRI    Arloa Chroman, PA-C 11/21/23 1902

## 2023-11-22 DIAGNOSIS — R202 Paresthesia of skin: Secondary | ICD-10-CM | POA: Diagnosis not present

## 2023-11-22 MED ORDER — METHOCARBAMOL 500 MG PO TABS: 500.0000 mg | ORAL_TABLET | Freq: Three times a day (TID) | ORAL | 0 refills | Status: AC | PRN

## 2023-11-22 MED ORDER — KETOROLAC TROMETHAMINE 15 MG/ML IJ SOLN
15.0000 mg | Freq: Once | INTRAMUSCULAR | Status: AC
  Administered 2023-11-22: 15 mg via INTRAMUSCULAR

## 2023-11-22 NOTE — ED Notes (Signed)
PT ambulated without assistance.

## 2023-11-24 NOTE — Telephone Encounter (Signed)
 Spoke with patient with interpreter and scheduled a hospital follow up appt for 10/21

## 2023-11-27 DIAGNOSIS — M5412 Radiculopathy, cervical region: Secondary | ICD-10-CM | POA: Diagnosis not present

## 2023-11-28 ENCOUNTER — Encounter: Payer: Self-pay | Admitting: Emergency Medicine

## 2023-11-28 ENCOUNTER — Ambulatory Visit (INDEPENDENT_AMBULATORY_CARE_PROVIDER_SITE_OTHER): Admitting: Emergency Medicine

## 2023-11-28 VITALS — BP 110/70 | HR 57 | Temp 97.6°F | Wt 166.6 lb

## 2023-11-28 DIAGNOSIS — Z09 Encounter for follow-up examination after completed treatment for conditions other than malignant neoplasm: Secondary | ICD-10-CM

## 2023-11-28 DIAGNOSIS — G992 Myelopathy in diseases classified elsewhere: Secondary | ICD-10-CM

## 2023-11-28 DIAGNOSIS — E785 Hyperlipidemia, unspecified: Secondary | ICD-10-CM

## 2023-11-28 DIAGNOSIS — M150 Primary generalized (osteo)arthritis: Secondary | ICD-10-CM | POA: Diagnosis not present

## 2023-11-28 DIAGNOSIS — M542 Cervicalgia: Secondary | ICD-10-CM

## 2023-11-28 DIAGNOSIS — G8929 Other chronic pain: Secondary | ICD-10-CM | POA: Diagnosis not present

## 2023-11-28 DIAGNOSIS — M545 Low back pain, unspecified: Secondary | ICD-10-CM

## 2023-11-28 DIAGNOSIS — M5136 Other intervertebral disc degeneration, lumbar region with discogenic back pain only: Secondary | ICD-10-CM | POA: Diagnosis not present

## 2023-11-28 DIAGNOSIS — R7303 Prediabetes: Secondary | ICD-10-CM | POA: Diagnosis not present

## 2023-11-28 DIAGNOSIS — K219 Gastro-esophageal reflux disease without esophagitis: Secondary | ICD-10-CM | POA: Diagnosis not present

## 2023-11-28 DIAGNOSIS — M4802 Spinal stenosis, cervical region: Secondary | ICD-10-CM | POA: Diagnosis not present

## 2023-11-28 DIAGNOSIS — F3342 Major depressive disorder, recurrent, in full remission: Secondary | ICD-10-CM

## 2023-11-28 NOTE — Assessment & Plan Note (Signed)
 Lab Results  Component Value Date   HGBA1C 6.3 11/30/2022  Diet and nutrition discussed Hemoglobin A1c has never crossed into diabetic territory. Cardiovascular risks associated with diabetes discussed

## 2023-11-28 NOTE — Assessment & Plan Note (Signed)
 Stable and asymptomatic

## 2023-11-28 NOTE — Assessment & Plan Note (Signed)
 Stable chronic condition Diet and nutrition discussed Continue rosuvastatin  40 mg daily

## 2023-11-28 NOTE — Progress Notes (Signed)
 Christine Hawkins 68 y.o.   Chief Complaint  Patient presents with   Hospitalization Follow-up    Pt states she does feel better. She declined flu vaccine.    HISTORY OF PRESENT ILLNESS: This is a 68 y.o. female here for follow-up of emergency department visit on 11/21/2023 when she presented with right-sided paresthesias Negative workup including MRI of brain. Much improved.  Today has no complaints or any other medical concerns.  Right-sided paresthesias.  Other than subjective decrease sensation to the right lateral lower leg, no other focal deficits noted on exam.   Differential diagnosis considered.  Workup below.   CBC without leukocytosis or anemia.  No significant electrolyte derangements or renal sufficiency.  MRI of the brain negative for any acute or remote strokes.  She does have evidence of chronic microvascular disease.  CVA unlikely.   This could be radiculopathy either from muscular or vertebral source.  Indication for emergent MRI of the cervical, thoracic or lumbar spines at this time. Patient able to ambulate well and at baseline. Recommend follow-up with neurosurgery   Final Clinical Impression(s) / ED Diagnoses Final diagnoses:  Paresthesia and pain of right extremity    HPI   Prior to Admission medications   Medication Sig Start Date End Date Taking? Authorizing Provider  acetaminophen  (TYLENOL ) 500 MG tablet Take 500 mg by mouth every 6 (six) hours as needed.   Yes [provider]  aspirin  EC 81 MG tablet Take 81 mg by mouth daily. Swallow whole.   Yes [provider]  azelastine (OPTIVAR) 0.05 % ophthalmic solution Place 1 drop into both eyes daily. 01/27/20  Yes [provider]  Cholecalciferol (VITAMIN D-3 PO) Take 1 capsule by mouth daily.   Yes [provider]  gabapentin  (NEURONTIN ) 100 MG capsule Take 1 capsule (100 mg total) by mouth at bedtime. Patient taking differently: Take 100 mg by mouth daily as needed  (For pain). 10/19/21  Yes Gershon Donnice SAUNDERS, DPM  Ginger, Zingiber officinalis, (GINGER PO) Take 1 capsule by mouth daily.   Yes [provider]  loratadine  (CLARITIN ) 10 MG tablet Take 10 mg by mouth daily.   Yes [provider]  methocarbamol  (ROBAXIN ) 500 MG tablet Take 1-2 tablets (500-1,000 mg total) by mouth every 8 (eight) hours as needed for muscle spasms. 11/22/23  Yes Cardama, Raynell Moder, MD  rosuvastatin  (CRESTOR ) 40 MG tablet Take 1 tablet (40 mg total) by mouth daily. 04/13/23  Yes Laderrick Wilk, Emil Schanz, MD  Bempedoic Acid -Ezetimibe  (NEXLIZET ) 180-10 MG TABS Take 1 tablet by mouth daily. Patient not taking: Reported on 11/28/2023 06/06/22   Krasowski, Robert J, MD  [employer] COENZYME Q10 PO Take 100 mg by mouth daily. Patient not taking: Reported on 11/28/2023    [provider]  Multiple Vitamins-Minerals (CENTRUM SILVER 50+WOMEN) TABS Take 1 tablet by mouth daily. Unknown strength Patient not taking: Reported on 11/28/2023    [provider]  mupirocin  ointment (BACTROBAN ) 2 % Sig to affected area twice a day x 7 days. Patient not taking: Reported on 02/07/2023 03/18/19   Dakhari Zuver Jose, MD  triamcinolone  (NASACORT ) 55 MCG/ACT AERO nasal inhaler Place 2 sprays into the nose daily. Patient not taking: Reported on 11/28/2023 03/18/19   Jaleena Viviani Jose, MD  VITAMIN E PO Take 1 tablet by mouth daily. Patient not taking: Reported on 11/28/2023    [provider]    Allergies  Allergen Reactions   Lactose Intolerance (Gi)    Other  Dish/laundry dish detergent and bleach-smell bothers patient and dust   Bupropion Nausea Only   Niaspan [Niacin] Rash    Itching    Patient Active Problem List   Diagnosis Date Noted   Stenosis of cervical spine with myelopathy (HCC) 03/17/2022   S/P cervical spinal fusion 03/17/2022   Degenerative disc disease, lumbar 11/25/2021   Osteopenia 09/01/2020   Depression 09/01/2020   Colon polyps  09/01/2020   Anxiety 09/01/2020   Trigger ring finger of right hand 07/07/2020   Chronic neck pain 11/07/2017   Bilateral carotid artery stenosis 05/11/2017   Insomnia due to other mental disorder 04/19/2016   Chronic bilateral low back pain without sciatica 09/14/2015   Prediabetes 04/02/2015   Hyperlipidemia LDL goal <130 04/02/2015   Gastroesophageal reflux disease without esophagitis 04/02/2015   Recurrent major depressive disorder, in full remission 04/02/2015   Carpal tunnel syndrome of right wrist 01/30/2015   Bilateral leg edema 01/30/2015   Status post cervical spinal arthrodesis 05/22/2011   Primary osteoarthritis involving multiple joints 12/01/2006   Dyslipidemia 05/12/2006   History of colonic polyps 05/12/2006    Past Medical History:  Diagnosis Date   Anxiety    Colon polyps    Depression    GERD (gastroesophageal reflux disease)    High cholesterol    Hypertension    Osteopenia    PONV (postoperative nausea and vomiting)    Pre-diabetes     Past Surgical History:  Procedure Laterality Date   ANTERIOR CERVICAL DECOMP/DISCECTOMY FUSION N/A 03/17/2022   Procedure: Anterior Cervical Decompression Fusion Cervical three-four, removal CERVICAL PLATE;  Surgeon: Debby Dorn MATSU, MD;  Location: [facility];  Service: Neurosurgery;  Laterality: N/A;   BREAST LUMPECTOMY Left    CERVICAL SPINE SURGERY      2013 has 8 screws    CESAREAN SECTION     COLONOSCOPY  08/26/2009   Normal colonoscopy up to cecum    LEFT HEART CATHETERIZATION WITH CORONARY ANGIOGRAM N/A 06/07/2012   Procedure: LEFT HEART CATHETERIZATION WITH CORONARY ANGIOGRAM;  Surgeon: Erick JONELLE Bergamo, MD;  Location[facility] CATH LAB;  Service: Cardiovascular;  Laterality: N/A;    Social History   Socioeconomic History   Marital status: Legally Separated    Spouse name: Not on file   Number of children: 2   Years of education: Not on file   Highest education level: Not on file  Occupational History    Occupation: retired  Tobacco Use   Smoking status: Never   Smokeless tobacco: Never  Vaping Use   Vaping status: Never Used  Substance and Sexual Activity   Alcohol use: Not Currently   Drug use: Not Currently   Sexual activity: Not Currently  Other Topics Concern   Not on file  Social History Narrative   ** Merged History Encounter **       Social Drivers of Health   Financial Resource Strain: Low Risk  (10/28/2022)   Overall Financial Resource Strain (CARDIA)    Difficulty of Paying Living Expenses: Not hard at all  Food Insecurity: No Food Insecurity (10/28/2022)   Hunger Vital Sign    Worried About Running Out of Food in the Last Year: Never true    Ran Out of Food in the Last Year: Never true  Transportation Needs: No Transportation Needs (10/28/2022)   PRAPARE - Administrator, Civil Service (Medical): No    Lack of Transportation (Non-Medical): No  Physical Activity: Insufficiently Active (10/28/2022)   Exercise Vital Sign  Days of Exercise per Week: 7 days    Minutes of Exercise per Session: 20 min  Stress: No Stress Concern Present (10/28/2022)   [employer] of Occupational Health - Occupational Stress Questionnaire    Feeling of Stress : Not at all  Social Connections: Moderately Integrated (10/28/2022)   Social Connection and Isolation Panel    Frequency of Communication with Friends and Family: More than three times a week    Frequency of Social Gatherings with Friends and Family: Not on file    Attends Religious Services: More than 4 times per year    Active Member of [employer] or Organizations: Yes    Attends Banker Meetings: More than 4 times per year    Marital Status: Separated  Intimate Partner Violence: Not At Risk (10/28/2022)   Humiliation, Afraid, Rape, and Kick questionnaire    Fear of Current or Ex-Partner: No    Emotionally Abused: No    Physically Abused: No    Sexually Abused: No    Family History  Problem Relation  Age of Onset   Heart disease Mother    Heart disease Father    Colon polyps Father    Kidney disease Maternal Grandmother    Stomach cancer Paternal Grandmother    Diabetes Paternal Grandmother    Esophageal cancer Neg Hx    Colon cancer Neg Hx    Rectal cancer Neg Hx      Review of Systems  Constitutional: Negative.  Negative for chills and fever.  HENT: Negative.  Negative for congestion and sore throat.   Respiratory: Negative.  Negative for cough and shortness of breath.   Cardiovascular: Negative.  Negative for chest pain and palpitations.  Gastrointestinal:  Negative for abdominal pain, diarrhea, nausea and vomiting.  Genitourinary: Negative.  Negative for dysuria and hematuria.  Musculoskeletal:  Positive for back pain and joint pain.  Skin: Negative.  Negative for rash.  Neurological: Negative.  Negative for dizziness and headaches.  All other systems reviewed and are negative.   Today's Vitals   11/28/23 0943  BP: 110/70  Pulse: (!) 57  Temp: 97.6 F (36.4 C)  TempSrc: Oral  SpO2: 99%  Weight: 166 lb 9.6 oz (75.6 kg)   Body mass index is 29.99 kg/m.   Physical Exam Vitals reviewed.  Constitutional:      Appearance: Normal appearance.  HENT:     Head: Normocephalic.     Mouth/Throat:     Mouth: Mucous membranes are moist.     Pharynx: Oropharynx is clear.  Eyes:     Extraocular Movements: Extraocular movements intact.     Pupils: Pupils are equal, round, and reactive to light.  Cardiovascular:     Rate and Rhythm: Normal rate and regular rhythm.     Pulses: Normal pulses.     Heart sounds: Normal heart sounds.  Pulmonary:     Effort: Pulmonary effort is normal.     Breath sounds: Normal breath sounds.  Abdominal:     Palpations: Abdomen is soft.     Tenderness: There is no abdominal tenderness.  Musculoskeletal:        General: Deformity present.     Cervical back: No tenderness.  Lymphadenopathy:     Cervical: No cervical adenopathy.   Skin:    General: Skin is warm and dry.     Capillary Refill: Capillary refill takes less than 2 seconds.  Neurological:     General: No focal deficit present.  Mental Status: She is alert and oriented to person, place, and time.  Psychiatric:        Mood and Affect: Mood normal.        Behavior: Behavior normal.      ASSESSMENT & PLAN: A total of 43 minutes was spent with the patient and counseling/coordination of care regarding preparing for this visit, review of most recent office visit notes, review of multiple chronic medical conditions and their management, review of recent emergency department visit notes, review of recent brain imaging, review of all medications, review of most recent bloodwork results, review of health maintenance items, education on nutrition, prognosis, documentation, and need for follow up.   Problem List Items Addressed This Visit       Digestive   Gastroesophageal reflux disease without esophagitis   Stable and asymptomatic          Nervous and Auditory   Stenosis of cervical spine with myelopathy (HCC) - Primary   Stable and well-controlled Takes Tylenol  as needed for pain        Musculoskeletal and Integument   Primary osteoarthritis involving multiple joints   Stable and well-controlled Pain management discussed Continues Tylenol  as needed for pain        Degenerative disc disease, lumbar   Stable with occasional low back pain        Other   Dyslipidemia   Stable chronic condition Diet and nutrition discussed Continue rosuvastatin  40 mg daily      Chronic neck pain   Much improved after cervical fusion surgery Off opiates.  Takes Tylenol  only as needed Follows up with neurosurgeon on a regular basis      Chronic bilateral low back pain without sciatica   Secondary to degenerative disc disease.  Stable and well-controlled      Prediabetes   Lab Results  Component Value Date   HGBA1C 6.3 11/30/2022  Diet and  nutrition discussed Hemoglobin A1c has never crossed into diabetic territory. Cardiovascular risks associated with diabetes discussed       Recurrent major depressive disorder, in full remission   Stable.  No concerns.      Other Visit Diagnoses       Hospital discharge follow-up          Patient Instructions  Mantenimiento de la salud despus de los 65 aos de edad Health Maintenance After Age 63 Despus de los 65 aos de edad, corre un riesgo mayor de Film/video editor enfermedades e infecciones a largo plazo, como tambin de sufrir lesiones por cadas. Las cadas son la causa principal de las fracturas de huesos y lesiones en la cabeza de personas mayores de 65 aos de edad. Recibir cuidados preventivos de forma regular puede ayudarlo a mantenerse saludable y en buen [place]. Los cuidados preventivos incluyen realizarse anlisis de forma regular y Forensic psychologist en el estilo de vida segn las recomendaciones del mdico. Converse con el mdico sobre lo siguiente: Las pruebas de deteccin y los anlisis que debe International aid/development worker. Una prueba de deteccin es un estudio que se para Engineer, manufacturing la presencia de una enfermedad cuando no tiene sntomas. Un plan de dieta y ejercicios adecuado para usted. Qu debo saber sobre las pruebas de deteccin y los anlisis para prevenir cadas? Realizarse pruebas de deteccin y [employer] es la mejor manera de Engineer, manufacturing un problema de salud de forma temprana. El diagnstico y tratamiento tempranos le brindan la mejor oportunidad de Chief Operating Officer las afecciones mdicas que son comunes despus de los 65 aos  de edad. Ciertas afecciones y elecciones de estilo de vida pueden hacer que sea ms propenso a sufrir Engineer, site. El mdico puede recomendarle lo siguiente: Controles regulares de la visin. Una visin deficiente y afecciones como las cataratas pueden hacer que sea ms propenso a sufrir Engineer, site. Si usa  lentes, asegrese de obtener una receta actualizada si su visin  cambia. Revisin de medicamentos. Revise regularmente con el mdico todos los medicamentos que toma, incluidos los medicamentos de [place]. Consulte al [employer] efectos secundarios que pueden hacer que sea ms propenso a sufrir Engineer, site. Informe al mdico si alguno de los medicamentos que toma lo hace sentir mareado o somnoliento. Controles de fuerza y equilibrio. El mdico puede recomendar ciertos estudios para controlar su fuerza y equilibrio al estar de pie, al caminar o al cambiar de posicin. Examen de los pies. El dolor y Materials engineer en los pies, como tambin no utilizar el calzado adecuado, pueden hacer que sea ms propenso a sufrir Engineer, site. Pruebas de deteccin, que incluyen las siguientes: Pruebas de deteccin para la osteoporosis. La osteoporosis es una afeccin que hace que los huesos se tornen ms dbiles y se quiebren con ms facilidad. Pruebas de deteccin para la presin arterial. Los cambios en la presin arterial y los medicamentos para Chief Operating Officer la presin arterial pueden hacerlo sentir mareado. Prueba de deteccin de la depresin. Es ms probable que sufra una cada si tiene miedo a caerse, se siente deprimido o se siente incapaz de Probation officer. Prueba de deteccin de consumo de alcohol. Beber demasiado alcohol puede afectar su equilibrio y puede hacer que sea ms propenso a sufrir Engineer, site. Siga estas indicaciones en su casa: Estilo de vida No beba alcohol si: Su mdico le indica no hacerlo. Si bebe alcohol: Limite la cantidad que bebe a lo siguiente: De 0 a 1 medida por da para las mujeres. De 0 a 2 medidas por da para los hombres. Sepa cunta cantidad de alcohol hay en las bebidas que toma. En los [address], una medida equivale a una botella de cerveza de 12 oz (355 ml), un vaso de vino de 5 oz (148 ml) o un vaso de una bebida alcohlica de alta graduacin de 1 oz (44 ml). No consuma ningn producto que contenga nicotina o  tabaco. Estos productos incluyen cigarrillos, tabaco para Theatre manager y aparatos de vapeo, como los cigarrillos electrnicos. Si necesita ayuda para dejar de consumir estos productos, consulte al [employer]. Actividad  Siga un programa de ejercicio regular para mantenerse en forma. Esto lo ayudar a Radio producer equilibrio. Consulte al mdico qu tipos de ejercicios son adecuados para usted. Si necesita un bastn o un andador, selo segn las recomendaciones del mdico. Utilice calzado con buen apoyo y suela antideslizante. Seguridad  Ret[employer] los [employer] puedan causar tropiezos tales como alfombras, cables u obstculos. Instale equipos de seguridad, como barras para sostn en los baos y barandas de seguridad en las escaleras. Mantenga las habitaciones y los pasillos bien iluminados. Indicaciones generales Hable con el mdico sobre sus riesgos de sufrir una cada. Infrmele a su mdico si: Se cae. Asegrese de informarle a su mdico acerca de todas las cadas, incluso aquellas que parecen ser [employer]. Se siente mareado, cansado (tiene fatiga) o siente que pierde el equilibrio. Use los medicamentos de venta libre y los recetados solamente como se lo haya indicado el mdico. Estos incluyen suplementos. Siga una dieta sana y [place] un peso saludable. Una dieta saludable incluye  productos lcteos descremados, carnes bajas en contenido de grasa ([place]), fibra de granos enteros, frijoles y [place] frutas y verduras. Mantngase al da con las vacunas. Realcese los estudios de rutina de la salud, dentales y de Wellsite geologist. Resumen Tener un estilo de vida saludable y recibir cuidados preventivos pueden ayudar a Research scientist (physical sciences) salud y el bienestar despus de los 65 aos de [place]. Realizarse pruebas de deteccin y anlisis es la mejor manera de Engineer, manufacturing un problema de salud de forma temprana y ayudarlo a Automotive engineer una cada. El diagnstico y tratamiento tempranos le brindan la mejor oportunidad de Chief Operating Officer las  afecciones mdicas ms comunes en las personas mayores de 65 aos de edad. Las cadas son la causa principal de las fracturas de huesos y lesiones en la cabeza de personas mayores de 65 aos de edad. Tome precauciones para evitar una cada en su casa. Trabaje con el mdico para saber qu cambios que puede hacer para mejorar su salud y [place], y para prevenir las cadas. Esta informacin no tiene Theme park manager el consejo del mdico. Asegrese de hacerle al mdico cualquier pregunta que tenga. Document Revised: 07/01/2020 Document Reviewed: 07/01/2020 Elsevier Patient Education  2024 Elsevier Inc.     Emil Schaumann, MD [facility]

## 2023-11-28 NOTE — Assessment & Plan Note (Signed)
 Much improved after cervical fusion surgery Off opiates.  Takes Tylenol  only as needed Follows up with neurosurgeon on a regular basis

## 2023-11-28 NOTE — Assessment & Plan Note (Signed)
Stable and well-controlled Pain management discussed Continues Tylenol as needed for pain

## 2023-11-28 NOTE — Assessment & Plan Note (Signed)
 Secondary to degenerative disc disease.  Stable and well-controlled

## 2023-11-28 NOTE — Assessment & Plan Note (Signed)
 Stable.  No concerns.

## 2023-11-28 NOTE — Patient Instructions (Signed)
 Mantenimiento de la salud despus de los 65 aos de edad Health Maintenance After Age 70 Despus de los 65 aos de edad, corre un riesgo mayor de Film/video editor enfermedades e infecciones a largo plazo, como tambin de sufrir lesiones por cadas. Las cadas son la causa principal de las fracturas de huesos y lesiones en la cabeza de personas mayores de 65 aos de edad. Recibir cuidados preventivos de forma regular puede ayudarlo a mantenerse saludable y en buen [place]. Los cuidados preventivos incluyen realizarse anlisis de forma regular y Forensic psychologist en el estilo de vida segn las recomendaciones del mdico. Converse con el mdico sobre lo siguiente: Las pruebas de deteccin y los anlisis que debe International aid/development worker. Una prueba de deteccin es un estudio que se para Engineer, manufacturing la presencia de una enfermedad cuando no tiene sntomas. Un plan de dieta y ejercicios adecuado para usted. Qu debo saber sobre las pruebas de deteccin y los anlisis para prevenir cadas? Realizarse pruebas de deteccin y [employer] es la mejor manera de Engineer, manufacturing un problema de salud de forma temprana. El diagnstico y tratamiento tempranos le brindan la mejor oportunidad de Chief Operating Officer las afecciones mdicas que son comunes despus de los 65 aos de edad. Ciertas afecciones y elecciones de estilo de vida pueden hacer que sea ms propenso a sufrir Engineer, site. El mdico puede recomendarle lo siguiente: Controles regulares de la visin. Una visin deficiente y afecciones como las cataratas pueden hacer que sea ms propenso a sufrir Engineer, site. Si usa  lentes, asegrese de obtener una receta actualizada si su visin cambia. Revisin de medicamentos. Revise regularmente con el mdico todos los medicamentos que toma, incluidos los medicamentos de [place]. Consulte al [employer] efectos secundarios que pueden hacer que sea ms propenso a sufrir Engineer, site. Informe al mdico si alguno de los medicamentos que toma lo hace sentir mareado o  somnoliento. Controles de fuerza y equilibrio. El mdico puede recomendar ciertos estudios para controlar su fuerza y equilibrio al estar de pie, al caminar o al cambiar de posicin. Examen de los pies. El dolor y Materials engineer en los pies, como tambin no utilizar el calzado adecuado, pueden hacer que sea ms propenso a sufrir Engineer, site. Pruebas de deteccin, que incluyen las siguientes: Pruebas de deteccin para la osteoporosis. La osteoporosis es una afeccin que hace que los huesos se tornen ms dbiles y se quiebren con ms facilidad. Pruebas de deteccin para la presin arterial. Los cambios en la presin arterial y los medicamentos para Chief Operating Officer la presin arterial pueden hacerlo sentir mareado. Prueba de deteccin de la depresin. Es ms probable que sufra una cada si tiene miedo a caerse, se siente deprimido o se siente incapaz de Probation officer. Prueba de deteccin de consumo de alcohol. Beber demasiado alcohol puede afectar su equilibrio y puede hacer que sea ms propenso a sufrir Engineer, site. Siga estas indicaciones en su casa: Estilo de vida No beba alcohol si: Su mdico le indica no hacerlo. Si bebe alcohol: Limite la cantidad que bebe a lo siguiente: De 0 a 1 medida por da para las mujeres. De 0 a 2 medidas por da para los hombres. Sepa cunta cantidad de alcohol hay en las bebidas que toma. En los [address], una medida equivale a una botella de cerveza de 12 oz (355 ml), un vaso de vino de 5 oz (148 ml) o un vaso de una bebida alcohlica de alta graduacin de 1 oz (44 ml). No consuma ningn producto que  contenga nicotina o tabaco. Estos productos incluyen cigarrillos, tabaco para mascar y aparatos de vapeo, como los cigarrillos electrnicos. Si necesita ayuda para dejar de consumir estos productos, consulte al [employer]. Actividad  Siga un programa de ejercicio regular para mantenerse en forma. Esto lo ayudar a Radio producer equilibrio. Consulte al  mdico qu tipos de ejercicios son adecuados para usted. Si necesita un bastn o un andador, selo segn las recomendaciones del mdico. Utilice calzado con buen apoyo y suela antideslizante. Seguridad  [employer] los [employer] puedan causar tropiezos tales como alfombras, cables u obstculos. Instale equipos de seguridad, como barras para sostn en los baos y barandas de seguridad en las escaleras. Mantenga las habitaciones y los pasillos bien iluminados. Indicaciones generales Hable con el mdico sobre sus riesgos de sufrir una cada. Infrmele a su mdico si: Se cae. Asegrese de informarle a su mdico acerca de todas las cadas, incluso aquellas que parecen ser [employer]. Se siente mareado, cansado (tiene fatiga) o siente que pierde el equilibrio. Use los medicamentos de venta libre y los recetados solamente como se lo haya indicado el mdico. Estos incluyen suplementos. Siga una dieta sana y [place] un peso saludable. Una dieta saludable incluye productos lcteos descremados, carnes bajas en contenido de grasa ([place]), fibra de granos enteros, frijoles y [place] frutas y verduras. Mantngase al da con las vacunas. Realcese los estudios de rutina de la salud, dentales y de Wellsite geologist. Resumen Tener un estilo de vida saludable y recibir cuidados preventivos pueden ayudar a Research scientist (physical sciences) salud y el bienestar despus de los 65 aos de [place]. Realizarse pruebas de deteccin y anlisis es la mejor manera de Engineer, manufacturing un problema de salud de forma temprana y ayudarlo a Automotive engineer una cada. El diagnstico y tratamiento tempranos le brindan la mejor oportunidad de Chief Operating Officer las afecciones mdicas ms comunes en las personas mayores de 65 aos de edad. Las cadas son la causa principal de las fracturas de huesos y lesiones en la cabeza de personas mayores de 65 aos de edad. Tome precauciones para evitar una cada en su casa. Trabaje con el mdico para saber qu cambios que puede hacer para mejorar su salud y  [place], y para prevenir las cadas. Esta informacin no tiene Theme park manager el consejo del mdico. Asegrese de hacerle al mdico cualquier pregunta que tenga. Document Revised: 07/01/2020 Document Reviewed: 07/01/2020 Elsevier Patient Education  2024 [employer].

## 2023-11-28 NOTE — Assessment & Plan Note (Signed)
Stable and well-controlled Takes Tylenol as needed for pain

## 2023-11-28 NOTE — Assessment & Plan Note (Signed)
Stable with occasional low back pain

## 2023-12-05 ENCOUNTER — Telehealth: Payer: Self-pay | Admitting: Emergency Medicine

## 2023-12-05 DIAGNOSIS — E782 Mixed hyperlipidemia: Secondary | ICD-10-CM

## 2023-12-05 NOTE — Progress Notes (Signed)
 Pharmacy Quality Measure Review  This patient is appearing on a report for being at risk of failing the adherence measure for cholesterol (statin) medications this calendar year.   Medication: Rosuvastatin  40mg  Last fill date: 05/28 for 90 day supply  [employer] Interpretor ID: [id number]  Patient should have sufficient refills based on current prescription. Called mobile number, left voice message in English. Called home number using interpretor services. Son [place] answered.   Verified with son that he will confirm adherence with patient and, if she is out of refills, will submit a refill request through her pharmacy.  Angela Baalmann, PharmD [facility] Pharmacist

## 2024-03-06 ENCOUNTER — Ambulatory Visit
